# Patient Record
Sex: Male | Born: 1940
Health system: Southern US, Community
[De-identification: ages and names within clinical notes are randomized; demographics above are authoritative.]

## PROBLEM LIST (undated history)

## (undated) DIAGNOSIS — F1010 Alcohol abuse, uncomplicated: Secondary | ICD-10-CM

## (undated) DIAGNOSIS — K219 Gastro-esophageal reflux disease without esophagitis: Secondary | ICD-10-CM

## (undated) HISTORY — PX: NO PAST SURGERIES: SHX2092

---

## 2003-08-13 ENCOUNTER — Emergency Department (HOSPITAL_COMMUNITY): Admission: AD | Admit: 2003-08-13 | Discharge: 2003-08-13 | Payer: Self-pay | Admitting: *Deleted

## 2005-09-19 ENCOUNTER — Emergency Department (HOSPITAL_COMMUNITY): Admission: EM | Admit: 2005-09-19 | Discharge: 2005-09-20 | Payer: Self-pay | Admitting: Emergency Medicine

## 2013-09-22 ENCOUNTER — Encounter (HOSPITAL_COMMUNITY): Payer: Self-pay | Admitting: Emergency Medicine

## 2013-09-22 ENCOUNTER — Observation Stay (HOSPITAL_COMMUNITY)
Admission: EM | Admit: 2013-09-22 | Discharge: 2013-09-23 | Disposition: A | Discharge status: Home / Self Care | Payer: Medicare Other | Attending: Internal Medicine | Admitting: Internal Medicine

## 2013-09-22 ENCOUNTER — Emergency Department (HOSPITAL_COMMUNITY): Payer: Medicare Other

## 2013-09-22 DIAGNOSIS — K219 Gastro-esophageal reflux disease without esophagitis: Secondary | ICD-10-CM | POA: Diagnosis present

## 2013-09-22 DIAGNOSIS — E162 Hypoglycemia, unspecified: Secondary | ICD-10-CM | POA: Diagnosis present

## 2013-09-22 DIAGNOSIS — Z7982 Long term (current) use of aspirin: Secondary | ICD-10-CM | POA: Insufficient documentation

## 2013-09-22 DIAGNOSIS — R809 Proteinuria, unspecified: Secondary | ICD-10-CM | POA: Insufficient documentation

## 2013-09-22 DIAGNOSIS — F1020 Alcohol dependence, uncomplicated: Secondary | ICD-10-CM | POA: Insufficient documentation

## 2013-09-22 DIAGNOSIS — R9431 Abnormal electrocardiogram [ECG] [EKG]: Secondary | ICD-10-CM | POA: Insufficient documentation

## 2013-09-22 DIAGNOSIS — E1310 Other specified diabetes mellitus with ketoacidosis without coma: Principal | ICD-10-CM | POA: Insufficient documentation

## 2013-09-22 DIAGNOSIS — M6282 Rhabdomyolysis: Secondary | ICD-10-CM | POA: Insufficient documentation

## 2013-09-22 DIAGNOSIS — IMO0002 Reserved for concepts with insufficient information to code with codable children: Secondary | ICD-10-CM | POA: Insufficient documentation

## 2013-09-22 DIAGNOSIS — E861 Hypovolemia: Secondary | ICD-10-CM | POA: Insufficient documentation

## 2013-09-22 DIAGNOSIS — R319 Hematuria, unspecified: Secondary | ICD-10-CM

## 2013-09-22 DIAGNOSIS — R1310 Dysphagia, unspecified: Secondary | ICD-10-CM | POA: Insufficient documentation

## 2013-09-22 DIAGNOSIS — R000 Tachycardia, unspecified: Secondary | ICD-10-CM

## 2013-09-22 DIAGNOSIS — F17200 Nicotine dependence, unspecified, uncomplicated: Secondary | ICD-10-CM | POA: Insufficient documentation

## 2013-09-22 DIAGNOSIS — E1165 Type 2 diabetes mellitus with hyperglycemia: Secondary | ICD-10-CM | POA: Insufficient documentation

## 2013-09-22 DIAGNOSIS — R531 Weakness: Secondary | ICD-10-CM | POA: Diagnosis present

## 2013-09-22 DIAGNOSIS — Z23 Encounter for immunization: Secondary | ICD-10-CM | POA: Insufficient documentation

## 2013-09-22 DIAGNOSIS — F1010 Alcohol abuse, uncomplicated: Secondary | ICD-10-CM | POA: Diagnosis present

## 2013-09-22 DIAGNOSIS — D72819 Decreased white blood cell count, unspecified: Secondary | ICD-10-CM | POA: Insufficient documentation

## 2013-09-22 DIAGNOSIS — R823 Hemoglobinuria: Secondary | ICD-10-CM | POA: Insufficient documentation

## 2013-09-22 DIAGNOSIS — R634 Abnormal weight loss: Secondary | ICD-10-CM | POA: Insufficient documentation

## 2013-09-22 DIAGNOSIS — R6510 Systemic inflammatory response syndrome (SIRS) of non-infectious origin without acute organ dysfunction: Secondary | ICD-10-CM | POA: Diagnosis present

## 2013-09-22 DIAGNOSIS — D72829 Elevated white blood cell count, unspecified: Secondary | ICD-10-CM | POA: Insufficient documentation

## 2013-09-22 DIAGNOSIS — D539 Nutritional anemia, unspecified: Secondary | ICD-10-CM | POA: Insufficient documentation

## 2013-09-22 HISTORY — DX: Alcohol abuse, uncomplicated: F10.10

## 2013-09-22 HISTORY — DX: Gastro-esophageal reflux disease without esophagitis: K21.9

## 2013-09-22 LAB — CBC WITH DIFFERENTIAL/PLATELET
Basophils Absolute: 0 10*3/uL (ref 0.0–0.1)
Basophils Absolute: 0 10*3/uL (ref 0.0–0.1)
Basophils Relative: 0 % (ref 0–1)
Basophils Relative: 0 % (ref 0–1)
Eosinophils Absolute: 0 10*3/uL (ref 0.0–0.7)
Eosinophils Absolute: 0 10*3/uL (ref 0.0–0.7)
Eosinophils Relative: 0 % (ref 0–5)
Eosinophils Relative: 0 % (ref 0–5)
HCT: 37.9 % — ABNORMAL LOW (ref 39.0–52.0)
HCT: 32.3 % — ABNORMAL LOW (ref 39.0–52.0)
Hemoglobin: 13.1 g/dL (ref 13.0–17.0)
Hemoglobin: 11.1 g/dL — ABNORMAL LOW (ref 13.0–17.0)
Lymphocytes Relative: 5 % — ABNORMAL LOW (ref 12–46)
Lymphocytes Relative: 4 % — ABNORMAL LOW (ref 12–46)
Lymphs Abs: 0.9 10*3/uL (ref 0.7–4.0)
Lymphs Abs: 0.7 10*3/uL (ref 0.7–4.0)
MCH: 35.3 pg — ABNORMAL HIGH (ref 26.0–34.0)
MCH: 35 pg — ABNORMAL HIGH (ref 26.0–34.0)
MCHC: 34.6 g/dL (ref 30.0–36.0)
MCHC: 34.4 g/dL (ref 30.0–36.0)
MCV: 102.2 fL — ABNORMAL HIGH (ref 78.0–100.0)
MCV: 101.9 fL — ABNORMAL HIGH (ref 78.0–100.0)
Monocytes Absolute: 1 10*3/uL (ref 0.1–1.0)
Monocytes Absolute: 1.4 10*3/uL — ABNORMAL HIGH (ref 0.1–1.0)
Monocytes Relative: 5 % (ref 3–12)
Monocytes Relative: 8 % (ref 3–12)
Neutro Abs: 18 10*3/uL — ABNORMAL HIGH (ref 1.7–7.7)
Neutro Abs: 15.2 10*3/uL — ABNORMAL HIGH (ref 1.7–7.7)
Neutrophils Relative %: 90 % — ABNORMAL HIGH (ref 43–77)
Neutrophils Relative %: 88 % — ABNORMAL HIGH (ref 43–77)
Platelets: 264 10*3/uL (ref 150–400)
Platelets: 224 10*3/uL (ref 150–400)
RBC: 3.71 MIL/uL — ABNORMAL LOW (ref 4.22–5.81)
RBC: 3.17 MIL/uL — ABNORMAL LOW (ref 4.22–5.81)
RDW: 14.9 % (ref 11.5–15.5)
RDW: 14.7 % (ref 11.5–15.5)
WBC: 20 10*3/uL — ABNORMAL HIGH (ref 4.0–10.5)
WBC: 17.3 10*3/uL — ABNORMAL HIGH (ref 4.0–10.5)

## 2013-09-22 LAB — COMPREHENSIVE METABOLIC PANEL
ALT: 12 U/L (ref 0–53)
AST: 36 U/L (ref 0–37)
Albumin: 3.8 g/dL (ref 3.5–5.2)
Alkaline Phosphatase: 76 U/L (ref 39–117)
BUN: 26 mg/dL — ABNORMAL HIGH (ref 6–23)
CO2: 22 mEq/L (ref 19–32)
Calcium: 9 mg/dL (ref 8.4–10.5)
Chloride: 103 mEq/L (ref 96–112)
Creatinine, Ser: 1.14 mg/dL (ref 0.50–1.35)
GFR calc Af Amer: 72 mL/min — ABNORMAL LOW (ref >90)
GFR calc non Af Amer: 62 mL/min — ABNORMAL LOW (ref >90)
Glucose, Bld: 124 mg/dL — ABNORMAL HIGH (ref 70–99)
Potassium: 4.7 mEq/L (ref 3.5–5.1)
Sodium: 144 mEq/L (ref 135–145)
Total Bilirubin: 0.3 mg/dL (ref 0.3–1.2)
Total Protein: 7.4 g/dL (ref 6.0–8.3)

## 2013-09-22 LAB — URINALYSIS, ROUTINE W REFLEX MICROSCOPIC
Bilirubin Urine: NEGATIVE
Glucose, UA: 250 mg/dL — AB
Ketones, ur: 15 mg/dL — AB
Leukocytes, UA: NEGATIVE
Nitrite: NEGATIVE
Protein, ur: 30 mg/dL — AB
Specific Gravity, Urine: 1.015 (ref 1.005–1.030)
Urobilinogen, UA: 0.2 mg/dL (ref 0.0–1.0)
pH: 5 (ref 5.0–8.0)

## 2013-09-22 LAB — RAPID URINE DRUG SCREEN, HOSP PERFORMED
Amphetamines: NOT DETECTED
Barbiturates: NOT DETECTED
Benzodiazepines: NOT DETECTED
Cocaine: NOT DETECTED
Opiates: NOT DETECTED

## 2013-09-22 LAB — URINE MICROSCOPIC-ADD ON

## 2013-09-22 LAB — POCT I-STAT TROPONIN I: Troponin i, poc: 0.01 ng/mL (ref 0.00–0.08)

## 2013-09-22 LAB — TROPONIN I: Troponin I: <0.3 ng/mL (ref <0.30)

## 2013-09-22 LAB — LACTIC ACID, PLASMA: Lactic Acid, Venous: 0.8 mmol/L (ref 0.5–2.2)

## 2013-09-22 LAB — OCCULT BLOOD, POC DEVICE: Fecal Occult Bld: NEGATIVE

## 2013-09-22 LAB — GLUCOSE, CAPILLARY
Glucose-Capillary: 170 mg/dL — ABNORMAL HIGH (ref 70–99)
Glucose-Capillary: 60 mg/dL — ABNORMAL LOW (ref 70–99)
Glucose-Capillary: 89 mg/dL (ref 70–99)
Glucose-Capillary: 77 mg/dL (ref 70–99)

## 2013-09-22 LAB — ETHANOL: Alcohol, Ethyl (B): <11 mg/dL (ref 0–11)

## 2013-09-22 LAB — RETICULOCYTES: Retic Ct Pct: 0.8 % (ref 0.4–3.1)

## 2013-09-22 MED ORDER — THIAMINE HCL 100 MG/ML IJ SOLN
Freq: Once | INTRAVENOUS | Status: DC
  Filled 2013-09-22: qty 1000

## 2013-09-22 MED ORDER — VITAMIN B-1 100 MG PO TABS
100.0000 mg | ORAL_TABLET | Freq: Every day | ORAL | Status: DC
  Administered 2013-09-23: 100 mg via ORAL
  Filled 2013-09-22: qty 1

## 2013-09-22 MED ORDER — THIAMINE HCL 100 MG/ML IJ SOLN
Freq: Once | INTRAVENOUS | Status: AC
  Administered 2013-09-22: 22:00:00 via INTRAVENOUS
  Filled 2013-09-22: qty 1000

## 2013-09-22 MED ORDER — PANTOPRAZOLE SODIUM 40 MG IV SOLR
40.0000 mg | Freq: Two times a day (BID) | INTRAVENOUS | Status: DC
  Administered 2013-09-22 – 2013-09-23 (×2): 40 mg via INTRAVENOUS
  Filled 2013-09-22 (×3): qty 40

## 2013-09-22 MED ORDER — SODIUM CHLORIDE 0.9 % IV BOLUS (SEPSIS)
1000.0000 mL | Freq: Once | INTRAVENOUS | Status: AC
  Administered 2013-09-22: 1000 mL via INTRAVENOUS

## 2013-09-22 MED ORDER — ONDANSETRON HCL 4 MG PO TABS: 4.0000 mg | ORAL_TABLET | Freq: Four times a day (QID) | ORAL | Status: DC | PRN

## 2013-09-22 MED ORDER — FOLIC ACID 1 MG PO TABS
1.0000 mg | ORAL_TABLET | Freq: Every day | ORAL | Status: DC
  Administered 2013-09-23: 11:00:00 1 mg via ORAL
  Filled 2013-09-22: qty 1

## 2013-09-22 MED ORDER — LORAZEPAM 1 MG PO TABS: 1.0000 mg | ORAL_TABLET | Freq: Four times a day (QID) | ORAL | Status: DC | PRN

## 2013-09-22 MED ORDER — LORAZEPAM 2 MG/ML IJ SOLN: 1.0000 mg | Freq: Four times a day (QID) | INTRAMUSCULAR | Status: DC | PRN

## 2013-09-22 MED ORDER — THIAMINE HCL 100 MG/ML IJ SOLN: Freq: Once | INTRAVENOUS | Status: DC

## 2013-09-22 MED ORDER — SODIUM CHLORIDE 0.9 % IV SOLN: INTRAVENOUS | Status: DC

## 2013-09-22 MED ORDER — PANTOPRAZOLE SODIUM 40 MG PO TBEC: 40.0000 mg | DELAYED_RELEASE_TABLET | Freq: Every day | ORAL | Status: DC

## 2013-09-22 MED ORDER — SODIUM CHLORIDE 0.9 % IJ SOLN: 3.0000 mL | Freq: Two times a day (BID) | INTRAMUSCULAR | Status: DC

## 2013-09-22 MED ORDER — ONDANSETRON HCL 4 MG/2ML IJ SOLN: 4.0000 mg | Freq: Four times a day (QID) | INTRAMUSCULAR | Status: DC | PRN

## 2013-09-22 MED ORDER — LORAZEPAM 2 MG/ML IJ SOLN: 0.0000 mg | Freq: Four times a day (QID) | INTRAMUSCULAR | Status: DC

## 2013-09-22 MED ORDER — ONDANSETRON HCL 4 MG/2ML IJ SOLN: 4.0000 mg | Freq: Three times a day (TID) | INTRAMUSCULAR | Status: DC | PRN

## 2013-09-22 MED ORDER — LORAZEPAM 2 MG/ML IJ SOLN: 0.0000 mg | Freq: Two times a day (BID) | INTRAMUSCULAR | Status: DC

## 2013-09-22 MED ORDER — HEPARIN SODIUM (PORCINE) 5000 UNIT/ML IJ SOLN
5000.0000 [IU] | Freq: Three times a day (TID) | INTRAMUSCULAR | Status: DC
  Administered 2013-09-22 – 2013-09-23 (×3): 5000 [IU] via SUBCUTANEOUS
  Filled 2013-09-22 (×5): qty 1

## 2013-09-22 MED ORDER — INFLUENZA VAC SPLIT QUAD 0.5 ML IM SUSP
0.5000 mL | INTRAMUSCULAR | Status: AC
  Administered 2013-09-23: 11:00:00 0.5 mL via INTRAMUSCULAR
  Filled 2013-09-22: qty 0.5

## 2013-09-22 MED ORDER — ONDANSETRON HCL 4 MG/2ML IJ SOLN
4.0000 mg | Freq: Once | INTRAMUSCULAR | Status: AC
  Administered 2013-09-22: 4 mg via INTRAVENOUS
  Filled 2013-09-22: qty 2

## 2013-09-22 MED ORDER — PNEUMOCOCCAL VAC POLYVALENT 25 MCG/0.5ML IJ INJ
0.5000 mL | INJECTION | INTRAMUSCULAR | Status: AC
  Administered 2013-09-23: 11:00:00 0.5 mL via INTRAMUSCULAR
  Filled 2013-09-22: qty 0.5

## 2013-09-22 NOTE — Progress Notes (Signed)
Received pt assignment from charge at 1755. Called ED for report at 1808. Monica , RN said she needed to call back. Left my direct number.

## 2013-09-22 NOTE — H&P (Signed)
Date: 09/23/2013               Patient Name:  Brandon Taylor MRN: [id number]  DOB: 12/19/1940 Age / Sex: 72 y.o., male   PCP: Karene Fry Day, MD         Medical Service: Internal Medicine Teaching Service         Attending Physician: Dr. Rocco Serene, MD    First Contact: Jule Ser, MS3 Pager: [phone]  Second Contact: Dr. Leonia Reeves Pager: [phone]       After Hours (After 5p/  First Contact Pager: [phone]  weekends / holidays): Second Contact Pager: [phone]   Chief Complaint: Weakness  History of Present Illness:  Brandon Taylor is a 72 y.o.man PMH alcoholism, tobacco abuse, abdominal trauma leading to multiple GI surgeries in the 1970's, who was transported by EMS to the ED after collapsing to the ground, feeling weak and speaking incoherently this morning.   He reports that this morning, he arose from the couch and walked back towards his bedroom. After he had walked several yards, he said that his legs "gave out" and he "went down." He then crawled back to the bedroom. His wife was home but did not witness the fall. She stated that when she tried to speak to him shortly thereafter, his language was garbled and she was unable to comprehend him. At that point, she called 911 and EMS transported him to the hospital. He states that this has never happened before. He endorses improvement in his symptoms since arrival to the ED after eating crackers and peanut butter. He says he feels "good now."  Brandon Taylor endorses significant consumption of alcohol. Per the patient's wife he has a history of alcohol withdrawal leading to DTs in the setting of his trauma surgeries in the 1970's, but reports no seizures. He states that he drank "a fifth" of liquor last night and drinks most nights per week, but could not precisely quantify his drinking, though his wife states that he drinks most nights and has drank for 30+ years. His last drink was last night. He also smokes 4 packs of cigarettes per day.  Denies drug use. Over the last two days, he has eaten only breakfast yesterday and has not drank any water, stating that he has not been hungry or thirsty. In fact, he reports a 100-pound weight loss over the past two years. He attributes this to being diagnosed with GERD. He takes no home meds, including no nutritional supplements or vitamins. He does not follow with a PCP because he is "compeltely healthy."  On review of systems, endorses a productive cough for 2 days and some throat soreness. Denies shortness of breath, chest pain, rhinorrhea. No fevers or chills. His wife says he has had diarrhea ever since the surgery for abdominal trauma. He has trouble "absorbing foods" and has to use the bathroom soon after he eats a meal. He denies melena or hematochezia. He denies nausea, vomiting, diarrhea. He reports occasional "choking" with swallowing which has been present for a long time.  In the ED he got a 2L NS fluid bolus.  Meds: Current Facility-Administered Medications  Medication Dose Route Frequency Provider Last Rate Last Dose  . antiseptic oral rinse (BIOTENE) solution 15 mL  15 mL Mouth Rinse q12n4p Rocco Serene, MD      . chlorhexidine (PERIDEX) 0.12 % solution 15 mL  15 mL Mouth Rinse BID Rocco Serene, MD      .  folic acid (FOLVITE) tablet 1 mg  1 mg Oral Daily Ky Barban, MD      . heparin injection 5,000 Units  5,000 Units Subcutaneous Q8H Ky Barban, MD   5,000 Units at 09/23/13 0531  . influenza vac split quadrivalent PF (FLUARIX) injection 0.5 mL  0.5 mL Intramuscular Tomorrow-1000 Rocco Serene, MD      . LORazepam (ATIVAN) injection 0-4 mg  0-4 mg Intravenous Q6H Ky Barban, MD       Followed by  . [START ON 09/24/2013] LORazepam (ATIVAN) injection 0-4 mg  0-4 mg Intravenous Q12H Ky Barban, MD      . LORazepam (ATIVAN) tablet 1 mg  1 mg Oral Q6H PRN Ky Barban, MD       Or  . LORazepam (ATIVAN) injection 1 mg  1 mg  Intravenous Q6H PRN Ky Barban, MD      . ondansetron [facility]) tablet 4 mg  4 mg Oral Q6H PRN Ky Barban, MD       Or  . ondansetron (ZOFRAN) injection 4 mg  4 mg Intravenous Q6H PRN Ky Barban, MD      . pantoprazole (PROTONIX) injection 40 mg  40 mg Intravenous Q12H Ky Barban, MD   40 mg at 09/22/13 2142  . pneumococcal 23 valent vaccine (PNU-IMMUNE) injection 0.5 mL  0.5 mL Intramuscular Tomorrow-1000 Rocco Serene, MD      . sodium chloride 0.9 % injection 3 mL  3 mL Intravenous Q12H Ky Barban, MD      . thiamine (VITAMIN B-1) tablet 100 mg  100 mg Oral Daily Ky Barban, MD        Allergies: Allergies as of 09/22/2013  . (No Known Allergies)   Past Medical History  Diagnosis Date  . GERD (gastroesophageal reflux disease)   . ETOH abuse    Past Surgical History  Procedure Laterality Date  . No past surgeries     No family history on file. History   Social History  . Marital Status: Married    Spouse Name: N/A    Number of Children: N/A  . Years of Education: N/A   Occupational History  . Not on file.   Social History Main Topics  . Smoking status: Current Every Day Smoker -- 2.00 packs/day for 40 years  . Smokeless tobacco: Not on file  . Alcohol Use: Yes     Comment: fifth daily  . Drug Use: No  . Sexual Activity: Not on file   Other Topics Concern  . Not on file   Social History Narrative  . No narrative on file    Review of Systems: Pertinent items are noted in HPI.  Physical Exam: Blood pressure 124/60, pulse 80, temperature 98.8 F (37.1 C), temperature source Oral, resp. rate 20, height 6' (1.829 m), weight 123 lb 3.8 oz (55.9 kg), SpO2 95.00%. Physical Exam  Constitutional: He is oriented to person, place, and time. He appears malnourished. No distress.  Thin extremities.  HENT:  Head: Normocephalic and atraumatic.  Mouth/Throat: Mucous membranes are dry. No oropharyngeal exudate,  posterior oropharyngeal edema or posterior oropharyngeal erythema.  Eyes: Conjunctivae and EOM are normal. Pupils are equal, round, and reactive to light.  Neck: Normal range of motion. Neck supple.  Cardiovascular: Regular rhythm, normal heart sounds and intact distal pulses.  Tachycardia present.  Exam reveals no gallop and no friction rub.   No murmur heard. Pulmonary/Chest: Effort normal  and breath sounds normal. No respiratory distress. He has no wheezes. He has no rales. He exhibits no tenderness.  Abdominal: Soft. Bowel sounds are normal. He exhibits no distension. There is no tenderness.  Scars scattered over abdomen from multiple surgeries.  Musculoskeletal: Normal range of motion. He exhibits no edema and no tenderness.  Neurological: He is alert and oriented to person, place, and time. He has normal reflexes. He displays facial symmetry and normal speech. No cranial nerve deficit or sensory deficit. He exhibits normal muscle tone. Coordination normal. GCS score is 15.  Skin: Skin is warm and dry. He is not diaphoretic.  Psychiatric:  Irritable.     Lab results: Basic Metabolic Panel:  Recent Labs  45/40/98 1134 09/23/13 0215  NA 144 143  K 4.7 4.2  CL 103 112  CO2 22 25  GLUCOSE 124* 107*  BUN 26* 21  CREATININE 1.14 1.30  CALCIUM 9.0 7.7*   Liver Function Tests:  Recent Labs  09/22/13 1134  AST 36  ALT 12  ALKPHOS 76  BILITOT 0.3  PROT 7.4  ALBUMIN 3.8   No results found for this basename: LIPASE, AMYLASE,  in the last 72 hours No results found for this basename: AMMONIA,  in the last 72 hours CBC:  Recent Labs  09/22/13 1134 09/22/13 1420 09/23/13 0215  WBC 20.0* 17.3* 12.6*  NEUTROABS 18.0* 15.2*  --   HGB 13.1 11.1* 9.9*  HCT 37.9* 32.3* 28.4*  MCV 102.2* 101.9* 100.4*  PLT 264 224 208   Cardiac Enzymes:  Recent Labs  09/22/13 2110 09/23/13 0215  CKTOTAL 820*  --   TROPONINI <0.30 <0.30   BNP: No results found for this basename:  PROBNP,  in the last 72 hours D-Dimer: No results found for this basename: DDIMER,  in the last 72 hours CBG:  Recent Labs  09/22/13 1054 09/22/13 1450 09/22/13 1555 09/22/13 2052 09/23/13 0031 09/23/13 0523  GLUCAP 170* 60* 89 77 107* 94   Hemoglobin A1C:  Recent Labs  09/22/13 2110  HGBA1C 5.6   Fasting Lipid Panel: No results found for this basename: CHOL, HDL, LDLCALC, TRIG, CHOLHDL, LDLDIRECT,  in the last 72 hours Thyroid Function Tests:  Recent Labs  09/22/13 2110  TSH 0.191*   Anemia Panel:  Recent Labs  09/22/13 2110  VITAMINB12 131*  FOLATE 14.6  FERRITIN 357*  TIBC 237  IRON 28*  RETICCTPCT 0.8   Coagulation: No results found for this basename: LABPROT, INR,  in the last 72 hours Urine Drug Screen: Drugs of Abuse     Component Value Date/Time   LABOPIA NONE DETECTED 09/22/2013 1253   COCAINSCRNUR NONE DETECTED 09/22/2013 1253   LABBENZ NONE DETECTED 09/22/2013 1253   AMPHETMU NONE DETECTED 09/22/2013 1253   THCU NONE DETECTED 09/22/2013 1253   LABBARB NONE DETECTED 09/22/2013 1253    Alcohol Level:  Recent Labs  09/22/13 1134  ETH <11   Urinalysis:  Recent Labs  09/22/13 1253  COLORURINE YELLOW  LABSPEC 1.015  PHURINE 5.0  GLUCOSEU 250*  HGBUR LARGE*  BILIRUBINUR NEGATIVE  KETONESUR 15*  PROTEINUR 30*  UROBILINOGEN 0.2  NITRITE NEGATIVE  LEUKOCYTESUR NEGATIVE     Imaging results:  Ct Abdomen Pelvis Wo Contrast  09/22/2013   CLINICAL DATA:  Abdominal pain.  EXAM: CT ABDOMEN AND PELVIS WITHOUT CONTRAST  TECHNIQUE: Multidetector CT imaging of the abdomen and pelvis was performed following the standard protocol without intravenous contrast.  COMPARISON:  None.  FINDINGS: The lung bases  are clear.  No renal, ureteral, or bladder calculi. No obstructive uropathy. No perinephric stranding is seen. The kidneys are symmetric in size without evidence for exophytic mass. The bladder is unremarkable.  The liver demonstrates no focal  abnormality. The gallbladder is unremarkable. 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 The adrenal glands and pancreas are normal.  The unopacified stomach, duodenum, small intestine and large intestine are unremarkable, but evaluation is limited by lack of oral contrast. There is no pneumoperitoneum, pneumatosis, or portal venous gas. There is no abdominal or pelvic free fluid. There is no lymphadenopathy.  The abdominal aorta is normal in caliber.  There is bilateral sacroiliitis.  IMPRESSION: 1.  No acute abdominal or pelvic pathology.  2. Bilateral sacroiliitis as can be seen with inflammatory bowel disease, ankylosing spondylitis versus osteoarthritis.   Electronically Signed   By: Elige Ko   On: 09/22/2013 16:01   Dg Chest 2 View  09/22/2013   CLINICAL DATA:  Altered mental status  EXAM: CHEST  2 VIEW  COMPARISON:  None.  FINDINGS: The heart size and mediastinal contours are within normal limits. There is no focal infiltrate, pulmonary edema, or pleural effusion. There is mild scoliosis of spine.  IMPRESSION: No active cardiopulmonary disease.   Electronically Signed   By: Sherian Rein M.D.   On: 09/22/2013 12:41   Ct Head Wo Contrast  09/22/2013   CLINICAL DATA:  Nausea since 4 o'clock  EXAM: CT HEAD WITHOUT CONTRAST  TECHNIQUE: Contiguous axial images were obtained from the base of the skull through the vertex without intravenous contrast.  COMPARISON:  None.  FINDINGS: There is no evidence of mass effect, midline shift, or extra-axial fluid collections. There is no evidence of a space-occupying lesion or intracranial hemorrhage. There is no evidence of a cortical-based area of acute infarction. There is generalized cerebral atrophy. There is periventricular white matter low attenuation likely secondary to microangiopathy.  The ventricles and sulci are appropriate for the patient's age. The basal cisterns are patent.  Visualized portions of the orbits are unremarkable. The visualized portions  of the paranasal sinuses and mastoid air cells are unremarkable. Cerebrovascular atherosclerotic calcifications are noted.  The osseous structures are unremarkable.  IMPRESSION: No acute intracranial pathology.   Electronically Signed   By: Elige Ko   On: 09/22/2013 12:15    Other results: EKG: there are no previous tracings available for comparison, normal sinus rhythm, frequent PVC's noted, left axis deviation.  Assessment & Plan by Problem: Brandon Taylor is a 72 y.o.man PMH alcoholism, tobacco abuse, abdominal trauma leading to multiple GI surgeries in the 1970's, who was transported by EMS to the ED after collapsing to the ground, feeling weak and speaking incoherently this morning.   #Transient weakness - Symptoms have resolved. Weakness is likely multifactorial from dehydration, alcohol consumption, hypoglycemia. Brandon Taylor has not had sufficient food or fluid intake over the past two days and this appears to be a chronic issue for him (see problem below). Symptoms could represent a TIA/stroke, but his neuro exam is not focal and he had an unremarkable head CT. Vitamin B12 deficiency can present with neurologic sequelae, and he does have a macrocytic anemia (Hgb 11.1, MCV 101.9). Patient is at risk for this given his bowel resection surgeries (esp. If his terminal ileum was affected) and alcoholism. EKG shows multiple PVCs. ACS and arrhythmia are on the differential. POC troponin is negative. - Admit to IMTS, telemetry - Banana bag @100cc /hr - Vitamin B12 > 131 (  low) - Smear review - Folate > 14.6 (wnl) - Cycle troponins > neg x2 - BMP and CBC in am - Repeat EKG in am - PT eval and treat - NPO pending SLP  #SIRS - Patient met SIRS criteria on admission with a leukocytosis of 20.0 with left shift to 90%, tachycardia, hypothermia to 96. He endorses a productive cough. CXR with no no acute abnormalities. Not complaining of dysuria. UA not suggestive of UTI. Satting 96% on room air. -  Blood cultures x2 - Vital signs q6h - Lactic acid > 0.8 (wnl) - Repeat CXR in am to evaluate for pneumonia after hydration  #Excessive alcohol intake - Brandon Taylor likely has alcohol dependence, based on his longstanding alcohol intake and history of DTs with a prior admission. His alcohol level is negative, suggesting it has been at least 8-12 hours since his last drink. He says he drank a 5th of liquor on the morning before admission. On exam he is showing early signs of alcohol withdrawal, including tachycardia and irritability. - CIWA protocol with Ativan - Folate 1mg  daily - Thiamine 100mg  daily - Seizure precautions  #Hemoglobinuria - UA with large hemoglobin but few RBCs. - Obtain CK and CKMB > CK 820 (elevated)  #Metabolic acidosis - Anion gap is 19 with delta gap of 3.5 suggesting combined metabolic acidosis and compensated respiratory acidosis. The latter makes sense given likely COPD (CXR with hyperinflation and 4PPD smoking history). Differential includes lactic acidosis (meets SIRS criteria, likely rhabdomyolysis given hemoglobinuria, elevated CK), starvation ketoacidosis (low PO intake, ketones in urine), alcoholic ketoacidosis (history of alcohol abuse).  - Continue to monitor, work up as above  #Chronic weight loss - Patient reports a 100lb weight loss over the past 2 years. He has not had regular follow up with the medical system. He has had multiple GI surgeries including bowel resections, which increases his risk of malabsorption syndromes. Albumin is 3.8, so he does not appear to have severe protein calorie malnutrition. CXR shows hyperinflation and patient has a 4 PPD smoking history x 40 years, so longstanding COPD could also be contributing to his weight loss. Malignancy (esp. lung cancer and esophageal cancer given dysphagia) is also possible but CXR is without obvious mass. - Nutrition consult - Ferritin > 357 (high) - Iron > 28 (low) - TIBC > 237 (wnl) - Saturation  ratios > 12% (low) - Reticulocytes > 0.8% (wnl) - TSH > 0.191 (low) - Free T4 - HIV ab > negative - Hemoglobin A1C > 5.6 (wnl) - CBG q4h  #GERD - Protonix IV  #DVT - Heparin subq   Dispo: Disposition is deferred at this time, awaiting improvement of current medical problems. Anticipated discharge in approximately 1-3 day(s).   The patient does have a current PCP Karene Fry Day, MD) and does need an [facility] hospital follow-up appointment after discharge.  The patient does not have transportation limitations that hinder transportation to clinic appointments.  Signed: Vivi Barrack, MD 09/23/2013, 7:36 AM

## 2013-09-22 NOTE — ED Notes (Signed)
Patient transported to CT 

## 2013-09-22 NOTE — ED Notes (Signed)
Upon standing beside stretcher, c/o dizziness & generalized weakness. Noted that HR increased to 120's. ED MD at bedside. Also informed of pt c/o sore throat  & difficulty swallowing food, states has had this problem intermittently in the past

## 2013-09-22 NOTE — ED Notes (Signed)
C/o nausea since 0400 no emesis. Pt admits to drinking a pint of moonshine yesterday & only meal was in the morning. Family reports that pt is heavy daily drinker x 3-4 years. Stated, "He really drank a lot last night. Went to bed by 7 pm".  Denies any abd pain, CP, no voiced complaints
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
## 2013-09-22 NOTE — ED Notes (Signed)
Notified RN of CBG 170

## 2013-09-22 NOTE — ED Notes (Signed)
Took pts CBG. CBG was 89 reported to nurse [facility]

## 2013-09-22 NOTE — ED Notes (Signed)
Asked pt to ambulate pt stated he didn't really feel like walking.

## 2013-09-22 NOTE — ED Notes (Signed)
Took pts CBG. CBG was 60 reported to nurse [facility].

## 2013-09-22 NOTE — ED Notes (Signed)
Family reports pt unresponsive, initial CBG 40, given amp D50 with increase CBG to 233 then became more alert.
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
## 2013-09-22 NOTE — ED Provider Notes (Signed)
Medical screening examination/treatment/procedure(s) were conducted as a shared visit with non-physician practitioner(s) and myself.  I personally evaluated the patient during the encounter.  Generalized weakness with collapse across bed. Did not hit head or LOC.  Hx alcohol abuse, denies other history. Cachectic, shivering, tachycardia, mild tremors. Disoriented to time (baseline per wife) Leukocytosis with hypothermia.  CXR neg, UA negative but blood.  IVF, thiamine, prn ativan.  EKG Interpretation    Date/Time:  Thursday September 22 2013 11:34:27 EST Ventricular Rate:  93 PR Interval:  144 QRS Duration: 75 QT Interval:  375 QTC Calculation: 466 R Axis:   -16 Text Interpretation:  Sinus rhythm Multiple ventricular premature complexes Borderline left axis deviation Abnormal R-wave progression, early transition Borderline T abnormalities, lateral leads No previous ECGs available Confirmed by Manus Gunning  MD, Joseph Johns ([id number]) on 09/22/2013 11:48:59 AM             Glynn Octave, MD 09/22/13 2042

## 2013-09-23 ENCOUNTER — Inpatient Hospital Stay (HOSPITAL_COMMUNITY): Payer: Medicare Other

## 2013-09-23 LAB — IRON AND TIBC
Iron: 28 ug/dL — ABNORMAL LOW (ref 42–135)
TIBC: 237 ug/dL (ref 215–435)
UIBC: 209 ug/dL (ref 125–400)

## 2013-09-23 LAB — GLUCOSE, CAPILLARY
Glucose-Capillary: 107 mg/dL — ABNORMAL HIGH (ref 70–99)
Glucose-Capillary: 144 mg/dL — ABNORMAL HIGH (ref 70–99)
Glucose-Capillary: 88 mg/dL (ref 70–99)
Glucose-Capillary: 94 mg/dL (ref 70–99)

## 2013-09-23 LAB — BASIC METABOLIC PANEL
Calcium: 7.7 mg/dL — ABNORMAL LOW (ref 8.4–10.5)
GFR calc Af Amer: 61 mL/min — ABNORMAL LOW (ref >90)
GFR calc non Af Amer: 53 mL/min — ABNORMAL LOW (ref >90)
Glucose, Bld: 107 mg/dL — ABNORMAL HIGH (ref 70–99)
Potassium: 4.2 mEq/L (ref 3.5–5.1)
Sodium: 143 mEq/L (ref 135–145)

## 2013-09-23 LAB — CBC
MCH: 35 pg — ABNORMAL HIGH (ref 26.0–34.0)
Platelets: 208 10*3/uL (ref 150–400)
RBC: 2.83 MIL/uL — ABNORMAL LOW (ref 4.22–5.81)
WBC: 12.6 10*3/uL — ABNORMAL HIGH (ref 4.0–10.5)

## 2013-09-23 LAB — HEMOGLOBIN A1C
Hgb A1c MFr Bld: 5.6 % (ref <5.7)
Mean Plasma Glucose: 114 mg/dL (ref <117)

## 2013-09-23 LAB — FERRITIN: Ferritin: 357 ng/mL — ABNORMAL HIGH (ref 22–322)

## 2013-09-23 LAB — VITAMIN B12: Vitamin B-12: 131 pg/mL — ABNORMAL LOW (ref 211–911)

## 2013-09-23 LAB — FOLATE: Folate: 14.6 ng/mL

## 2013-09-23 LAB — HIV ANTIBODY (ROUTINE TESTING W REFLEX): HIV: NONREACTIVE

## 2013-09-23 LAB — TSH: TSH: 0.191 u[IU]/mL — ABNORMAL LOW (ref 0.350–4.500)

## 2013-09-23 MED ORDER — CHLORHEXIDINE GLUCONATE 0.12 % MT SOLN
15.0000 mL | Freq: Two times a day (BID) | OROMUCOSAL | Status: DC
  Filled 2013-09-23 (×3): qty 15

## 2013-09-23 MED ORDER — THIAMINE HCL 100 MG PO TABS: 100.0000 mg | ORAL_TABLET | Freq: Every day | ORAL | Status: DC

## 2013-09-23 MED ORDER — ENSURE COMPLETE PO LIQD
237.0000 mL | Freq: Two times a day (BID) | ORAL | Status: DC
  Administered 2013-09-23: 237 mL via ORAL

## 2013-09-23 MED ORDER — ASPIRIN EC 81 MG PO TBEC: 81.0000 mg | DELAYED_RELEASE_TABLET | Freq: Every day | ORAL | Status: DC

## 2013-09-23 MED ORDER — CYANOCOBALAMIN 1000 MCG/ML IJ SOLN
1000.0000 ug | Freq: Once | INTRAMUSCULAR | Status: AC
  Administered 2013-09-23: 1000 ug via INTRAMUSCULAR
  Filled 2013-09-23: qty 1

## 2013-09-23 MED ORDER — FOLIC ACID 1 MG PO TABS: 1.0000 mg | ORAL_TABLET | Freq: Every day | ORAL | Status: DC

## 2013-09-23 MED ORDER — PANTOPRAZOLE SODIUM 40 MG PO TBEC: 40.0000 mg | DELAYED_RELEASE_TABLET | Freq: Every day | ORAL | Status: DC

## 2013-09-23 MED ORDER — BIOTENE DRY MOUTH MT LIQD: 15.0000 mL | Freq: Two times a day (BID) | OROMUCOSAL | Status: DC

## 2013-09-23 NOTE — Evaluation (Signed)
Clinical/Bedside Swallow Evaluation Patient Details  Name: Brandon Taylor MRN: [id number] Date of Birth: 12/19/1940  Today's Date: 09/23/2013 Time: 4098-1191 SLP Time Calculation (min): 10 min  Past Medical History:  Past Medical History  Diagnosis Date  . GERD (gastroesophageal reflux disease)   . ETOH abuse    Past Surgical History:  Past Surgical History  Procedure Laterality Date  . No past surgeries     HPI:  72 y.o.man PMH alcoholism, tobacco abuse, abdominal trauma leading to multiple GI surgeries in the 1970's, who was transported by EMS to the ED after collapsing to the ground, feeling weak and speaking incoherently Current dx include SIRS, excessive alcohol intake, hemoglobinuria, metabolic acidosis, chronic weight loss.  Reports hx of choking with swallowing in the past.    Assessment / Plan / Recommendation Clinical Impression  Pt presents with normal oropharyngeal swallow with active mastication, swift swallow response, and no indications of penetration nor aspiration.  Pt presents with intermittent eructation during assessment, and states that he has to eat "slow so I don't choke."  Suspect GERD primary issue.  No SLP needs identified.  Recommend resumption of a regular consistency diet, thin liquids.      Aspiration Risk    minimal   Diet Recommendation Regular;Thin liquid   Liquid Administration via: Cup;Straw Medication Administration: Whole meds with liquid Supervision: Patient able to self feed Postural Changes and/or Swallow Maneuvers: Upright 30-60 min after meal    Other  Recommendations     Follow Up Recommendations  None     Swallow Study Prior Functional Status       General HPI: 72 y.o.man PMH alcoholism, tobacco abuse, abdominal trauma leading to multiple GI surgeries in the 1970's, who was transported by EMS to the ED after collapsing to the ground, feeling weak and speaking incoherently Current dx include SIRS, excessive alcohol intake,  hemoglobinuria, metabolic acidosis, chronic weight loss.  Reports hx of choking with swallowing in the past.  Type of Study: Bedside swallow evaluation Diet Prior to this Study: NPO Temperature Spikes Noted: No Respiratory Status: Room air History of Recent Intubation: No Behavior/Cognition: Alert;Cooperative Oral Cavity - Dentition: Missing dentition;Poor condition Self-Feeding Abilities: Able to feed self Patient Positioning: Upright in bed Baseline Vocal Quality: Clear Volitional Cough: Strong Volitional Swallow: Able to elicit    Oral/Motor/Sensory Function Overall Oral Motor/Sensory Function: Appears within functional limits for tasks assessed   Ice Chips Ice chips: Within functional limits Presentation: Self Fed   Thin Liquid Thin Liquid: Within functional limits Presentation: Cup;Straw;Self Fed    Nectar Thick Nectar Thick Liquid: Not tested   Honey Thick Honey Thick Liquid: Not tested   Puree Puree: Within functional limits   Solid  Brandon Taylor L. [place], [place] CCC/SLP Pager [phone]     Solid: Within functional limits Presentation: Self Fed       Brandon Taylor Brandon Taylor 09/23/2013,10:04 AM

## 2013-09-23 NOTE — Discharge Summary (Signed)
Name: Brandon Taylor MRN: [id number] DOB: 12/19/1940 72 y.o. PCP: Trinna Post, MD  Date of Admission: 09/22/2013 10:47 AM Date of Discharge: 09/23/2013 Attending Physician: Rocco Serene, MD  Discharge Diagnosis: 1. Transient weakness 2. SIRS 3. Excessive alcohol intake 4. Mild rhabdomyolysis 5. Metabolic acidosis 6. Chronic weight loss 7. Macrocytic anemia  Discharge Medications:   Medication List         aspirin EC 81 MG tablet  Take 1 tablet (81 mg total) by mouth daily.     folic acid 1 MG tablet  Commonly known as:  FOLVITE  Take 1 tablet (1 mg total) by mouth daily.     pantoprazole 40 MG tablet  Commonly known as:  PROTONIX  Take 1 tablet (40 mg total) by mouth daily before breakfast.     thiamine 100 MG tablet  Take 1 tablet (100 mg total) by mouth daily.        Disposition and follow-up:   Brandon Taylor was discharged from [facility] in Stable condition.  At the hospital follow up visit please address:  1.  Please continue to provide B12 shots. He will need [phone] mcg every day or every other day for 1-2 weeks and maintenance doses of [phone] mcg every 1-3 months. Progress on tobacco cessation? Alcohol cessation? Consider starting calcium channel blocker for esophageal spasm.  2.  Labs / imaging needed at time of follow-up: BMP, CBC, colonoscopy  3.  Pending labs/ test needing follow-up: None  Follow-up Appointments: Follow-up Information   Follow up with [facility]     On 10/03/2013. (@9 :00am. This is your new PCP.)    Contact information:   [address] [place] [address] [phone]      Discharge Instructions: Discharge Orders   Future Appointments Provider Department Dept Phone   10/03/2013 9:00 AM [facility] Covering Provider [facility] [phone]   Future Orders Complete By Expires   Diet - low sodium heart healthy  As  directed    Increase activity slowly  As directed       Consultations:  None  Procedures Performed:  Ct Abdomen Pelvis Wo Contrast  09/22/2013   CLINICAL DATA:  Abdominal pain.  EXAM: CT ABDOMEN AND PELVIS WITHOUT CONTRAST  TECHNIQUE: Multidetector CT imaging of the abdomen and pelvis was performed following the standard protocol without intravenous contrast.  COMPARISON:  None.  FINDINGS: The lung bases are clear.  No renal, ureteral, or bladder calculi. No obstructive uropathy. No perinephric stranding is seen. The kidneys are symmetric in size without evidence for exophytic mass. The bladder is unremarkable.  The liver demonstrates no focal abnormality. The gallbladder is unremarkable. 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 The adrenal glands and pancreas are normal.  The unopacified stomach, duodenum, small intestine and large intestine are unremarkable, but evaluation is limited by lack of oral contrast. There is no pneumoperitoneum, pneumatosis, or portal venous gas. There is no abdominal or pelvic free fluid. There is no lymphadenopathy.  The abdominal aorta is normal in caliber.  There is bilateral sacroiliitis.  IMPRESSION: 1.  No acute abdominal or pelvic pathology.  2. Bilateral sacroiliitis as can be seen with inflammatory bowel disease, ankylosing spondylitis versus osteoarthritis.   Electronically Signed   By: Elige Ko   On: 09/22/2013 16:01   Dg Chest 2 View  09/23/2013   CLINICAL DATA:  Cough.  EXAM: CHEST  2 VIEW  COMPARISON:  Chest radiograph 09/22/2013  FINDINGS: Stable cardiac and mediastinal contours. Persistent chronic blunting of the bilateral costophrenic angles. Minimal scarring left lung base. No large consolidative pulmonary opacities. No pleural effusion or pneumothorax. Regional skeleton is unremarkable.  IMPRESSION: No acute cardiopulmonary process.   Electronically Signed   By: Annia Belt M.D.   On: 09/23/2013 07:51   Dg Chest 2 View  09/22/2013   CLINICAL  DATA:  Altered mental status  EXAM: CHEST  2 VIEW  COMPARISON:  None.  FINDINGS: The heart size and mediastinal contours are within normal limits. There is no focal infiltrate, pulmonary edema, or pleural effusion. There is mild scoliosis of spine.  IMPRESSION: No active cardiopulmonary disease.   Electronically Signed   By: Sherian Rein M.D.   On: 09/22/2013 12:41   Ct Head Wo Contrast  09/22/2013   CLINICAL DATA:  Nausea since 4 o'clock  EXAM: CT HEAD WITHOUT CONTRAST  TECHNIQUE: Contiguous axial images were obtained from the base of the skull through the vertex without intravenous contrast.  COMPARISON:  None.  FINDINGS: There is no evidence of mass effect, midline shift, or extra-axial fluid collections. There is no evidence of a space-occupying lesion or intracranial hemorrhage. There is no evidence of a cortical-based area of acute infarction. There is generalized cerebral atrophy. There is periventricular white matter low attenuation likely secondary to microangiopathy.  The ventricles and sulci are appropriate for the patient's age. The basal cisterns are patent.  Visualized portions of the orbits are unremarkable. The visualized portions of the paranasal sinuses and mastoid air cells are unremarkable. Cerebrovascular atherosclerotic calcifications are noted.  The osseous structures are unremarkable.  IMPRESSION: No acute intracranial pathology.   Electronically Signed   By: Elige Ko   On: 09/22/2013 12:15   Dg Esophagus  09/23/2013   CLINICAL DATA:  Dysphagia  EXAM: ESOPHOGRAM/BARIUM SWALLOW  TECHNIQUE: Fluoroscopic and image evaluation of the esophagus was performed using thick and thin liquid barium. The patient also consumed a 13 mm barium tablet under fluoroscopic guidance.  COMPARISON:  None.  FLUOROSCOPY TIME:  3 min, 8 seconds  FINDINGS: The patient swallows readily. There is esophageal dysmotility with spasm and tertiary contractions distally.  There is no hiatal hernia or reflux. There  is no demonstrable fixed stricture. There is no esophageal mass or ulceration. Pharynx appears normal.  13 mm barium tablet passed freely to the distal esophagus near the gastroesophageal junction. At this point, there was spasm; the tablet head a partially dissolved before passing into the stomach.  IMPRESSION: Distal esophageal spasm and esophageal dysmotility. No mass or ulceration. No fixed stricture. No appreciable hiatal hernia or reflux demonstrated.   Electronically Signed   By: Bretta Bang M.D.   On: 09/23/2013 14:32    Admission HPI: VONNIE LIGMAN is a 72 y.o.man PMH alcoholism, tobacco abuse, abdominal trauma leading to multiple GI surgeries in the 1970's, who was transported by EMS to the ED after collapsing to the ground, feeling weak and speaking incoherently this morning.  He reports that this morning, he arose from the couch and walked back towards his bedroom. After he had walked several yards, he said that his legs "gave out" and he "went down." He then crawled back to the bedroom. His wife was home but did not witness the fall. She stated that when she tried to speak to him shortly thereafter, his language was garbled and she was unable to comprehend him. At that point, she called 911 and EMS  transported him to the hospital. He states that this has never happened before. He endorses improvement in his symptoms since arrival to the ED after eating crackers and peanut butter. He says he feels "good now."  Mr. Robideau endorses significant consumption of alcohol. Per the patient's wife he has a history of alcohol withdrawal leading to DTs in the setting of his trauma surgeries in the 1970's, but reports no seizures. He states that he drank "a fifth" of liquor last night and drinks most nights per week, but could not precisely quantify his drinking, though his wife states that he drinks most nights and has drank for 30+ years. His last drink was last night. He also smokes 4 packs of  cigarettes per day. Denies drug use. Over the last two days, he has eaten only breakfast yesterday and has not drank any water, stating that he has not been hungry or thirsty. In fact, he reports a 100-pound weight loss over the past two years. He attributes this to being diagnosed with GERD. He takes no home meds, including no nutritional supplements or vitamins. He does not follow with a PCP because he is "compeltely healthy."  On review of systems, endorses a productive cough for 2 days and some throat soreness. Denies shortness of breath, chest pain, rhinorrhea. No fevers or chills. His wife says he has had diarrhea ever since the surgery for abdominal trauma. He has trouble "absorbing foods" and has to use the bathroom soon after he eats a meal. He denies melena or hematochezia. He denies nausea, vomiting, diarrhea. He reports occasional "choking" with swallowing which has been present for a long time.  In the ED he got a 2L NS fluid bolus.  Physical Exam:  Blood pressure 124/60, pulse 80, temperature 98.8 F (37.1 C), temperature source Oral, resp. rate 20, height 6' (1.829 m), weight 123 lb 3.8 oz (55.9 kg), SpO2 95.00%.  Physical Exam  Constitutional: He is oriented to person, place, and time. He appears malnourished. No distress.  Thin extremities.  HENT:  Head: Normocephalic and atraumatic.  Mouth/Throat: Mucous membranes are dry. No oropharyngeal exudate, posterior oropharyngeal edema or posterior oropharyngeal erythema.  Eyes: Conjunctivae and EOM are normal. Pupils are equal, round, and reactive to light.  Neck: Normal range of motion. Neck supple.  Cardiovascular: Regular rhythm, normal heart sounds and intact distal pulses. Tachycardia present. Exam reveals no gallop and no friction rub.  No murmur heard.  Pulmonary/Chest: Effort normal and breath sounds normal. No respiratory distress. He has no wheezes. He has no rales. He exhibits no tenderness.  Abdominal: Soft. Bowel sounds are  normal. He exhibits no distension. There is no tenderness.  Scars scattered over abdomen from multiple surgeries.  Musculoskeletal: Normal range of motion. He exhibits no edema and no tenderness.  Neurological: He is alert and oriented to person, place, and time. He has normal reflexes. He displays facial symmetry and normal speech. No cranial nerve deficit or sensory deficit. He exhibits normal muscle tone. Coordination normal. GCS score is 15.  Skin: Skin is warm and dry. He is not diaphoretic.  Psychiatric:  Irritable.    Hospital Course by problem list: Brandon Taylor is a 72 y.o.man PMH alcoholism, tobacco abuse, abdominal trauma leading to multiple GI surgeries in the 1970's, who was transported by EMS to the ED after collapsing to the ground, feeling weak and speaking incoherently this morning.   1. Transient weakness - Likely multifactorial from dehydration, alcohol consumption, hypoglycemia, mild rhabdomyolysis. Lab work  was significant for B12 deficiency with macrocytic anemia (Hgb 11.1, MCV 101.9) and history of bowel resection surgeries plus alcoholism. He got 2L of fluid in the ED followed by continuous IVF Banana bag @100cc /hr. Vitamin B12 was found to be 131 (low), so we gave 1000 mcg IM. Other labs include: Folate > 14.6 (wnl). Cycled troponins > neg x3. Admission EKG showed frequent PVCs, but upon repeat the next morning it was within normal limits. PT saw the patient and recommended no follow up.  2. SIRS - Patient met SIRS criteria on admission with a leukocytosis of 20.0 with left shift to 90%, tachycardia, hypothermia to 96. He was complaining of a mild cough, but CXR was normal other than hyperinflation. Repeat CXR after hydration was also nonfocal. This morning leukocytosis was downtrending as below. Tachycardia, hypothermia have resolved. Lactic acid > 0.8 (wnl). Likely inflammatory response to resolving rhabdomyolysis.  WBC   Date  Value  Range  Status   09/23/2013  12.6*   4.0 - 10.5 K/uL  Final   09/22/2013  17.3*  4.0 - 10.5 K/uL  Final   09/22/2013  20.0*  4.0 - 10.5 K/uL  Final    3. Excessive alcohol intake - Mr. Elison likely has alcohol dependence, based on his longstanding high alcohol intake and history of DTs. On admission he was showing early signs of alcohol withdrawal, including tachycardia and irritability. All vital signs are stable this morning. CIWAs have been consistently 1. He has not gotten any Ativan today. We started Folate 1mg  daily and Thiamine 100mg  daily on discharge.   4. Mild rhabdomyolysis - UA with large hemoglobin but few RBCs suggesting myoglobinuria. CK 820 (elevated). We provided fluid resuscitation as above and monitored for AKI. Cr within normal limits.  5. Metabolic acidosis - Anion gap on admission was 19 with delta gap of 3.5, suggesting combined metabolic acidosis and compensated respiratory acidosis. Likely 2/2 alcohol vs. starvation ketoacidosis. AG improved to 6 this morning.   6. Chronic weight loss - Patient reports a 100lb weight loss over the past 2 years. He has not had regular follow up with the medical system. Differential includes malabsorption syndrome vs. longstanding COPD vs. Malignancy. Because of some chronic dysphasia and weight loss a barium swallow was performed prior to discharge and demonstrated some mild distal esophageal spasm. We did not start calcium channel blockers during the admission given his recent fall, but is something that could be considered if his blood pressure remains elevated as an outpatient. SLP saw the patient and suspected GERD, recommended regular diet. Labs include Ferritin > 357 (high), Iron > 28 (low), TIBC > 237 (wnl), Saturation ratios > 12% (low), Reticulocytes > 0.8% (wnl), TSH > 0.191 (low), free T4 1.09 (wnl), HIV ab > negative, Hemoglobin A1C > 5.6 (wnl). Primary care follow up was emphasized with the patient. He has not been getting preventative medicine including colonoscopy.  [facility] up was arranged in the [facility] as his wife's family physician was not accepting new Medicare patients.  7. Macrocytic anemia - With hydration his hematocrit decreased from to 37 to 28, likely his baseline, and the admission level was almost certainly due to hemoconcentration. Continue to monitor as an outpatient.   Discharge Vitals:   BP 152/71  Pulse 73  Temp(Src) 98.2 F (36.8 C) (Oral)  Resp 20  Ht 6' (1.829 m)  Wt 123 lb 3.8 oz (55.9 kg)  BMI 16.71 kg/m2  SpO2 97%  Discharge Labs:  Results  for orders placed during the hospital encounter of 09/22/13 (from the past 24 hour(s))  GLUCOSE, CAPILLARY     Status: None   Collection Time    09/22/13  3:55 PM      Result Value Range   Glucose-Capillary 89  70 - 99 mg/dL  GLUCOSE, CAPILLARY     Status: None   Collection Time    09/22/13  8:52 PM      Result Value Range   Glucose-Capillary 77  70 - 99 mg/dL  TSH     Status: Abnormal   Collection Time    09/22/13  9:10 PM      Result Value Range   TSH 0.191 (*) 0.350 - 4.500 uIU/mL  HEMOGLOBIN A1C     Status: None   Collection Time    09/22/13  9:10 PM      Result Value Range   Hemoglobin A1C 5.6  <5.7 %   Mean Plasma Glucose 114  <117 mg/dL  VITAMIN W09     Status: Abnormal   Collection Time    09/22/13  9:10 PM      Result Value Range   Vitamin B-12 131 (*) 211 - 911 pg/mL  FOLATE     Status: None   Collection Time    09/22/13  9:10 PM      Result Value Range   Folate 14.6    IRON AND TIBC     Status: Abnormal   Collection Time    09/22/13  9:10 PM      Result Value Range   Iron 28 (*) 42 - 135 ug/dL   TIBC 811  914 - 782 ug/dL   Saturation Ratios 12 (*) 20 - 55 %   UIBC 209  125 - 400 ug/dL  FERRITIN     Status: Abnormal   Collection Time    09/22/13  9:10 PM      Result Value Range   Ferritin 357 (*) 22 - 322 ng/mL  RETICULOCYTES     Status: Abnormal   Collection Time    09/22/13  9:10 PM      Result Value Range   Retic  Ct Pct 0.8  0.4 - 3.1 %   RBC. 2.98 (*) 4.22 - 5.81 MIL/uL   Retic Count, Manual 23.8  19.0 - 186.0 K/uL  TROPONIN I     Status: None   Collection Time    09/22/13  9:10 PM      Result Value Range   Troponin I <0.30  <0.30 ng/mL  HIV ANTIBODY (ROUTINE TESTING)     Status: None   Collection Time    09/22/13  9:10 PM      Result Value Range   HIV NON REACTIVE  NON REACTIVE  CK     Status: Abnormal   Collection Time    09/22/13  9:10 PM      Result Value Range   Total CK 820 (*) 7 - 232 U/L  LACTIC ACID, PLASMA     Status: None   Collection Time    09/22/13  9:23 PM      Result Value Range   Lactic Acid, Venous 0.8  0.5 - 2.2 mmol/L  GLUCOSE, CAPILLARY     Status: Abnormal   Collection Time    09/23/13 12:31 AM      Result Value Range   Glucose-Capillary 107 (*) 70 - 99 mg/dL  BASIC METABOLIC PANEL     Status: Abnormal  Collection Time    09/23/13  2:15 AM      Result Value Range   Sodium 143  135 - 145 mEq/L   Potassium 4.2  3.5 - 5.1 mEq/L   Chloride 112  96 - 112 mEq/L   CO2 25  19 - 32 mEq/L   Glucose, Bld 107 (*) 70 - 99 mg/dL   BUN 21  6 - 23 mg/dL   Creatinine, Ser 1.61  0.50 - 1.35 mg/dL   Calcium 7.7 (*) 8.4 - 10.5 mg/dL   GFR calc non Af Amer 53 (*) >90 mL/min   GFR calc Af Amer 61 (*) >90 mL/min  CBC     Status: Abnormal   Collection Time    09/23/13  2:15 AM      Result Value Range   WBC 12.6 (*) 4.0 - 10.5 K/uL   RBC 2.83 (*) 4.22 - 5.81 MIL/uL   Hemoglobin 9.9 (*) 13.0 - 17.0 g/dL   HCT 09.6 (*) 04.5 - 40.9 %   MCV 100.4 (*) 78.0 - 100.0 fL   MCH 35.0 (*) 26.0 - 34.0 pg   MCHC 34.9  30.0 - 36.0 g/dL   RDW 81.1  91.4 - 78.2 %   Platelets 208  150 - 400 K/uL  TROPONIN I     Status: None   Collection Time    09/23/13  2:15 AM      Result Value Range   Troponin I <0.30  <0.30 ng/mL  GLUCOSE, CAPILLARY     Status: None   Collection Time    09/23/13  5:23 AM      Result Value Range   Glucose-Capillary 94  70 - 99 mg/dL  GLUCOSE, CAPILLARY      Status: None   Collection Time    09/23/13  7:55 AM      Result Value Range   Glucose-Capillary 88  70 - 99 mg/dL  TROPONIN I     Status: None   Collection Time    09/23/13  8:01 AM      Result Value Range   Troponin I <0.30  <0.30 ng/mL  T4, FREE     Status: None   Collection Time    09/23/13  8:30 AM      Result Value Range   Free T4 1.09  0.80 - 1.80 ng/dL  GLUCOSE, CAPILLARY     Status: Abnormal   Collection Time    09/23/13 11:51 AM      Result Value Range   Glucose-Capillary 144 (*) 70 - 99 mg/dL    Signed: Vivi Barrack, MD 09/23/2013, 3:44 PM   Time Spent on Discharge: 30 minutes Services Ordered on Discharge: None Equipment Ordered on Discharge: None

## 2013-09-23 NOTE — Progress Notes (Signed)
Patient admitted to 5w33 from ED. Patient lives at home with wife. Patient is A&Ox3. Patient's skin is warm, dry and intact. Patient placed on bedalarm, and instructed to call for assistance before getting out of bed. Patient stated understanding. Placed patient on telemetry. Oriented patient and family to room and unit. Will continue to monitor patient. Nelda Marseille, RN

## 2013-09-23 NOTE — Progress Notes (Signed)
[name] to be D/C'd Home per MD order.  Discussed with the patient and all questions fully answered.    Medication List         aspirin EC 81 MG tablet  Take 1 tablet (81 mg total) by mouth daily.     folic acid 1 MG tablet  Commonly known as:  FOLVITE  Take 1 tablet (1 mg total) by mouth daily.     pantoprazole 40 MG tablet  Commonly known as:  PROTONIX  Take 1 tablet (40 mg total) by mouth daily before breakfast.     thiamine 100 MG tablet  Take 1 tablet (100 mg total) by mouth daily.        VVS, Skin clean, dry and intact without evidence of skin break down, no evidence of skin tears noted. IV catheter discontinued intact. Site without signs and symptoms of complications. Dressing and pressure applied.  An After Visit Summary was printed and given to the patient. Follow up appointments , new prescriptions and medication administration times given. Handouts on thiamine, B12, folic acid and protonix given to pt and family member. All questions answered Patient escorted via WC, and D/C home via private auto.  Cindra Eves, RN 09/23/2013 4:22 PM

## 2013-09-23 NOTE — H&P (Signed)
Internal Medicine Attending Admission Note Date: 09/23/2013  Patient name: Brandon Taylor Medical record number: [id number] Date of birth: 12/19/1940 Age: 72 y.o. Gender: male  I saw and evaluated the patient. I reviewed the resident's note and I agree with the resident's findings and plan as documented in the resident's note.  [name] is a 71 year old man with a history of alcohol abuse, tobacco abuse, and abdominal trauma in the 1970s resulting in numerous GI surgeries who presents after falling. On the morning of admission he was in his usual state of health. He arose from the couch to go to the bathroom, walked for several feet and his legs gave out resulting in a fall. He returned to the bedroom where his wife saw him and noted that his speech was garbled and incomprehensible. She called 911 and she was concerned. By the time he presented to the emergency department he felt fine. Nonetheless, he was admitted to the internal medicine teaching service for observation and further evaluation.  Orthostatic blood pressures were not obtained on admission yet he was agressively hydrated because of a concern for volume depletion. He was also noted to have an alcoholic ketoacidosis. With hydration his hematocrit decreased to 28, likely his baseline, and the admission level was almost certainly due to hemoconcentration. The cause of his anemia was found to be a B12 deficiency and probable bone marrow suppression from alcohol. He was given B12 intramuscularly prior to discharge. After hydration he felt much improved and this morning on rounds he was without complaints and ready for discharge. Because of some chronic dysphasia and weight loss a barium swallow was performed prior to discharge and demonstrated some mild distal esophageal spasm. We did not start calcium channel blockers during the admission given his recent fall but is something that could be considered if his blood pressure remains elevated as an  outpatient.

## 2013-09-23 NOTE — Care Management Note (Signed)
    Page 1 of 1   09/23/2013     2:31:27 PM   CARE MANAGEMENT NOTE 09/23/2013  Patient:  Brandon Taylor, Brandon Taylor   Account Number:  [id number]  Date Initiated:  09/23/2013  Documentation initiated by:  Letha Cape  Subjective/Objective Assessment:   dx sirs  admit- lives with spouse. pta indep.     Action/Plan:   pt eval- no pt needs.   Anticipated DC Date:  09/23/2013   Anticipated DC Plan:  HOME/SELF CARE      DC Planning Services  CM consult      Choice offered to / List presented to:             Status of service:  Completed, signed off Medicare Important Message given?   (If response is "NO", the following Medicare IM given date fields will be blank) Date Medicare IM given:   Date Additional Medicare IM given:    Discharge Disposition:  HOME/SELF CARE  Per UR Regulation:  Reviewed for med. necessity/level of care/duration of stay  If discussed at Long Length of Stay Meetings, dates discussed:    Comments:  09/23/13 14:26 Letha Cape RN, BSN [phone] patient lives with spouse, pta indep.  Patient for swallow eval today, then for possbile dc.

## 2013-09-23 NOTE — Discharge Summary (Signed)
  I have seen and examined the patient, and reviewed the daily progress note by Jule Ser, MS 3 and discussed the care of the patient with them. Please see my progress note from 09/23/2013 for further details regarding assessment and plan.    Signed:  Vivi Barrack, MD 09/23/2013, 4:12 PM

## 2013-09-23 NOTE — H&P (Signed)
  I have seen and examined the patient myself, and I have reviewed the note by Jule Ser, MS 3 and was present during the interview and physical exam.  Please see my separate H&P for additional findings, assessment, and plan.   Signed: Vivi Barrack, MD 09/23/2013, 7:00 AM

## 2013-09-23 NOTE — Discharge Summary (Signed)
Physician Discharge Summary  Patient ID: Brandon Taylor MRN: [id number] DOB/AGE: 73/05/1941 73 y.o.  Admit date: 09/22/2013 Discharge date: 09/23/2013  Admission Diagnoses: SIRS   Discharge Diagnoses: SIRS, weakness 2/2 hypovolemia, diabetic ketoacidosis  Hospital Course: Brandon Taylor is a 72 year-old gentleman with a past medical history significant for alcoholism, tobacco abuse, abdominal trauma (resulting in multiple GI surgeries in the 1970's), who was transported by EMS to the ED after collapsing to the ground, feeling weak and speaking on the morning of 09/23/13.  He states that he arose from a seated position, walked several yards, felt his legs gave out and he went down.  Prior to this, he had eaten one meal in the preceding 48 hours.  EMS was called and he was transported to the hospital.  He endorsed significant improvement in his weakness after eating crackers and peanut butter in the ED.  Physical exam was significant for a very skinny man with dry mucous membranes, tachycardia and scars scattered about his abdomen following multiple surgeries.  CT scans of both the head and abdomen were unremarkable for any acute pathology and CXR did not identify any acute pathology.  His serum chemistries were significant for an anion gap of 19 and delta-delta of 3.5, consistent with a metabolic acidosis and respiratory alkalosis.  His CBC was significant for leukopenia at 20.0, neutrophilia at 18.0, hemoglobin 13.1 and hematocrit of 37.9 with a MCV of 102.2, consistent with macrocytic anemia, coroborated by a B12 level of 131 and iron of 28.  His glucose was 60 while in the ED.  Alcohol was negative.  Urinalysis revealed a glucose level of 25, large hemoglobinuria, ketone level in the urine of 15 and proteinuria of 30.  EKG demonstrated normal sinus rhythm with frequent PVCs and LAD.  Barium swallow, performed on 09/23/13, demonstrated an ability to consume a 13 mm barium tablet, Distal esophageal spasm and  esophageal dysmotility without mass or ulceration. There was no fixed stricture. There was no appreciable hiatal hernia or  reflux demonstrated.  A hospital course, arranged by problem, follows:  SIRS: He was found to meet SIRS criteria, though no obvious site of infection could be identified.  His urinalysis was negative for leukocytes or bacteria, making a UTI unlikely.  His CXR did not demonstrate any focal consolidations, making pneumonia unlikely.  However, his leukocytosis dropped to 12.6 within twelve hours of admission with no intervention and he experienced no symptoms.  He was not discharged on any medications.  Weakness: Brandon Taylor weakness has been attributed to a combination of dehydration and hypoglycemia since Brandon Taylor had not had sufficient food of fluid intake over the past two days.  The increased anion gap is consistent with alcoholic acidosis.    Excess alcohol intake: Brandon Taylor likely has alcohol dependence, based on his longstanding alcohol intake and history of DTs upon prior admission.  His chronic alcoholism was complicated by a low B12 level.  He did not develop any signs of withdrawal while hospitalized.  While hospitalized, he was given 1000 mcg B12, an oral thiamine supplement, and IV fluid containing dextrose, thiamine, folic acid and a multivitamin.  He was not discharged with any medications.  Weight loss: Brandon Taylor has experienced a 100-pound unintentional weight loss over the past two years.  This is likely due to a combination of short bowel syndrome and malignancy, particularly in the setting of a 160 pack-year smoking history. Lack of hematochezia or changes in bowel movements reduce the likelihood of  colonic malignancy. History of smoking and excess alcohol intake suggest possible esophageal cancer.  This is deferred to the outpatient setting.  He was not discharged with any medical management for his weight.  Dysphagia: Brandon Taylor endorsed dysphagia of indeterminate  duration.  Based on his barium swallow, he was determined to have esophageal spasm without stricture.  Further management, including endoscopy, is deferred to the outpatient setting.  Anemia: After rehydration, Brandon Taylor demonstrated an hemoglobin of 9.9.  This, in the context of a diminished B12 level and increased MCV, indicate anemia, likely due to a combination of marrow suppression from chronic alcoholism and B12 deficiency.  Further examination and management are deferred to the outpatient setting.  Hypoglycemia: this is likely secondary to poor food and fluid intake over the two days preceding hospitalization.  Abnormal EKG. EKG demonstrated sinus tachycardia, frequent PVC's noted, and left axis deviation.    Discharge Exam: Blood pressure 152/71, pulse 73, temperature 98.2 F (36.8 C), temperature source Oral, resp. rate 20, height 6' (1.829 m), weight 55.9 kg (123 lb 3.8 oz), SpO2 97.00%. Physical Exam deferred to Dr. Claudell Kyle  Disposition: Final discharge disposition not confirmed  Discharge Orders   Future Orders Complete By Expires   Diet - low sodium heart healthy  As directed    Increase activity slowly  As directed        Medication List         aspirin EC 81 MG tablet  Take 1 tablet (81 mg total) by mouth daily.     folic acid 1 MG tablet  Commonly known as:  FOLVITE  Take 1 tablet (1 mg total) by mouth daily.     pantoprazole 40 MG tablet  Commonly known as:  PROTONIX  Take 1 tablet (40 mg total) by mouth daily before breakfast.     thiamine 100 MG tablet  Take 1 tablet (100 mg total) by mouth daily.           Follow-up Information   Follow up with Trinna Post, MD. Schedule an appointment as soon as possible for a visit in 1 week.   Specialty:  Family Medicine   Contact information:   [address] [place] [address] [phone]       Signed: Lang Snow 09/23/2013, 3:10 PM

## 2013-09-23 NOTE — Progress Notes (Signed)
Subjective: Patient seen and examined at the bedside. He feels back to normal. No chest pain, shortness of breath, weakness. He appears somewhat resistant to medical follow up, but I had a conversation with his wife about how important this was. She understood and gave me the contact information for her PCP.  Objective: Vital signs in last 24 hours: Filed Vitals:   09/23/13 1249 09/23/13 1250 09/23/13 1251 09/23/13 1502  BP: 167/69 162/74 166/83 152/71  Pulse: 75 73 75 73  Temp:    98.2 F (36.8 C)  TempSrc:    Oral  Resp:      Height:      Weight:      SpO2: 98% 98% 100% 97%   Weight change:   Intake/Output Summary (Last 24 hours) at 09/23/13 1510 Last data filed at 09/23/13 0756  Gross per 24 hour  Intake   1625 ml  Output    600 ml  Net   1025 ml   Physical Exam  Constitutional: He is oriented to person, place, and time. He appears malnourished. No distress.  Thin extremities.  HENT:  Head: Normocephalic and atraumatic.  Mouth/Throat: Mucous membranes are dry. No oropharyngeal exudate, posterior oropharyngeal edema or posterior oropharyngeal erythema.  Eyes: Conjunctivae and EOM are normal. Pupils are equal, round, and reactive to light.  Neck: Normal range of motion. Neck supple.  Cardiovascular: Regular rhythm, normal heart sounds and intact distal pulses. Tachycardia present. Exam reveals no gallop and no friction rub.  No murmur heard.  Pulmonary/Chest: Effort normal and breath sounds normal. No respiratory distress. He has no wheezes. He has no rales. He exhibits no tenderness.  Abdominal: Soft. Bowel sounds are normal. He exhibits no distension. There is no tenderness.  Scars scattered over abdomen from multiple surgeries.  Musculoskeletal: Normal range of motion. He exhibits no edema and no tenderness.  Neurological: He is alert and oriented to person, place, and time. He has normal reflexes. He displays facial symmetry and normal speech. No cranial nerve deficit  or sensory deficit. He exhibits normal muscle tone. Coordination normal. GCS score is 15.  Skin: Skin is warm and dry. He is not diaphoretic.  Psychiatric:  Irritable.   Lab Results: Basic Metabolic Panel:  Recent Labs Lab 09/22/13 1134 09/23/13 0215  NA 144 143  K 4.7 4.2  CL 103 112  CO2 22 25  GLUCOSE 124* 107*  BUN 26* 21  CREATININE 1.14 1.30  CALCIUM 9.0 7.7*   Liver Function Tests:  Recent Labs Lab 09/22/13 1134  AST 36  ALT 12  ALKPHOS 76  BILITOT 0.3  PROT 7.4  ALBUMIN 3.8   No results found for this basename: LIPASE, AMYLASE,  in the last 168 hours No results found for this basename: AMMONIA,  in the last 168 hours CBC:  Recent Labs Lab 09/22/13 1134 09/22/13 1420 09/23/13 0215  WBC 20.0* 17.3* 12.6*  NEUTROABS 18.0* 15.2*  --   HGB 13.1 11.1* 9.9*  HCT 37.9* 32.3* 28.4*  MCV 102.2* 101.9* 100.4*  PLT 264 224 208   Cardiac Enzymes:  Recent Labs Lab 09/22/13 2110 09/23/13 0215 09/23/13 0801  CKTOTAL 820*  --   --   TROPONINI <0.30 <0.30 <0.30   BNP: No results found for this basename: PROBNP,  in the last 168 hours D-Dimer: No results found for this basename: DDIMER,  in the last 168 hours CBG:  Recent Labs Lab 09/22/13 1555 09/22/13 2052 09/23/13 0031 09/23/13 0523 09/23/13 0755 09/23/13 1151  GLUCAP 89 77 107* 94 88 144*   Hemoglobin A1C:  Recent Labs Lab 09/22/13 2110  HGBA1C 5.6   Fasting Lipid Panel: No results found for this basename: CHOL, HDL, LDLCALC, TRIG, CHOLHDL, LDLDIRECT,  in the last 168 hours Thyroid Function Tests:  Recent Labs Lab 09/22/13 2110  TSH 0.191*   Coagulation: No results found for this basename: LABPROT, INR,  in the last 168 hours Anemia Panel:  Recent Labs Lab 09/22/13 2110  VITAMINB12 131*  FOLATE 14.6  FERRITIN 357*  TIBC 237  IRON 28*  RETICCTPCT 0.8   Urine Drug Screen: Drugs of Abuse     Component Value Date/Time   LABOPIA NONE DETECTED 09/22/2013 1253    COCAINSCRNUR NONE DETECTED 09/22/2013 1253   LABBENZ NONE DETECTED 09/22/2013 1253   AMPHETMU NONE DETECTED 09/22/2013 1253   THCU NONE DETECTED 09/22/2013 1253   LABBARB NONE DETECTED 09/22/2013 1253    Alcohol Level:  Recent Labs Lab 09/22/13 1134  ETH <11   Urinalysis:  Recent Labs Lab 09/22/13 1253  COLORURINE YELLOW  LABSPEC 1.015  PHURINE 5.0  GLUCOSEU 250*  HGBUR LARGE*  BILIRUBINUR NEGATIVE  KETONESUR 15*  PROTEINUR 30*  UROBILINOGEN 0.2  NITRITE NEGATIVE  LEUKOCYTESUR NEGATIVE    Micro Results: No results found for this or any previous visit (from the past 240 hour(s)). Studies/Results: Ct Abdomen Pelvis Wo Contrast  09/22/2013   CLINICAL DATA:  Abdominal pain.  EXAM: CT ABDOMEN AND PELVIS WITHOUT CONTRAST  TECHNIQUE: Multidetector CT imaging of the abdomen and pelvis was performed following the standard protocol without intravenous contrast.  COMPARISON:  None.  FINDINGS: The lung bases are clear.  No renal, ureteral, or bladder calculi. No obstructive uropathy. No perinephric stranding is seen. The kidneys are symmetric in size without evidence for exophytic mass. The bladder is unremarkable.  The liver demonstrates no focal abnormality. The gallbladder is unremarkable. 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 The adrenal glands and pancreas are normal.  The unopacified stomach, duodenum, small intestine and large intestine are unremarkable, but evaluation is limited by lack of oral contrast. There is no pneumoperitoneum, pneumatosis, or portal venous gas. There is no abdominal or pelvic free fluid. There is no lymphadenopathy.  The abdominal aorta is normal in caliber.  There is bilateral sacroiliitis.  IMPRESSION: 1.  No acute abdominal or pelvic pathology.  2. Bilateral sacroiliitis as can be seen with inflammatory bowel disease, ankylosing spondylitis versus osteoarthritis.   Electronically Signed   By: Elige Ko   On: 09/22/2013 16:01   Dg Chest 2  View  09/23/2013   CLINICAL DATA:  Cough.  EXAM: CHEST  2 VIEW  COMPARISON:  Chest radiograph 09/22/2013  FINDINGS: Stable cardiac and mediastinal contours. Persistent chronic blunting of the bilateral costophrenic angles. Minimal scarring left lung base. No large consolidative pulmonary opacities. No pleural effusion or pneumothorax. Regional skeleton is unremarkable.  IMPRESSION: No acute cardiopulmonary process.   Electronically Signed   By: Annia Belt M.D.   On: 09/23/2013 07:51   Dg Chest 2 View  09/22/2013   CLINICAL DATA:  Altered mental status  EXAM: CHEST  2 VIEW  COMPARISON:  None.  FINDINGS: The heart size and mediastinal contours are within normal limits. There is no focal infiltrate, pulmonary edema, or pleural effusion. There is mild scoliosis of spine.  IMPRESSION: No active cardiopulmonary disease.   Electronically Signed   By: Sherian Rein M.D.   On: 09/22/2013 12:41   Ct Head Wo Contrast  09/22/2013   CLINICAL DATA:  Nausea since 4 o'clock  EXAM: CT HEAD WITHOUT CONTRAST  TECHNIQUE: Contiguous axial images were obtained from the base of the skull through the vertex without intravenous contrast.  COMPARISON:  None.  FINDINGS: There is no evidence of mass effect, midline shift, or extra-axial fluid collections. There is no evidence of a space-occupying lesion or intracranial hemorrhage. There is no evidence of a cortical-based area of acute infarction. There is generalized cerebral atrophy. There is periventricular white matter low attenuation likely secondary to microangiopathy.  The ventricles and sulci are appropriate for the patient's age. The basal cisterns are patent.  Visualized portions of the orbits are unremarkable. The visualized portions of the paranasal sinuses and mastoid air cells are unremarkable. Cerebrovascular atherosclerotic calcifications are noted.  The osseous structures are unremarkable.  IMPRESSION: No acute intracranial pathology.   Electronically Signed   By:  Elige Ko   On: 09/22/2013 12:15   Dg Esophagus  09/23/2013   CLINICAL DATA:  Dysphagia  EXAM: ESOPHOGRAM/BARIUM SWALLOW  TECHNIQUE: Fluoroscopic and image evaluation of the esophagus was performed using thick and thin liquid barium. The patient also consumed a 13 mm barium tablet under fluoroscopic guidance.  COMPARISON:  None.  FLUOROSCOPY TIME:  3 min, 8 seconds  FINDINGS: The patient swallows readily. There is esophageal dysmotility with spasm and tertiary contractions distally.  There is no hiatal hernia or reflux. There is no demonstrable fixed stricture. There is no esophageal mass or ulceration. Pharynx appears normal.  13 mm barium tablet passed freely to the distal esophagus near the gastroesophageal junction. At this point, there was spasm; the tablet head a partially dissolved before passing into the stomach.  IMPRESSION: Distal esophageal spasm and esophageal dysmotility. No mass or ulceration. No fixed stricture. No appreciable hiatal hernia or reflux demonstrated.   Electronically Signed   By: Bretta Bang M.D.   On: 09/23/2013 14:32   Medications: I have reviewed the patient's current medications. Scheduled Meds: . antiseptic oral rinse  15 mL Mouth Rinse q12n4p  . chlorhexidine  15 mL Mouth Rinse BID  . feeding supplement (ENSURE COMPLETE)  237 mL Oral BID BM  . folic acid  1 mg Oral Daily  . heparin  5,000 Units Subcutaneous Q8H  . LORazepam  0-4 mg Intravenous Q6H   Followed by  . [START ON 09/24/2013] LORazepam  0-4 mg Intravenous Q12H  . pantoprazole (PROTONIX) IV  40 mg Intravenous Q12H  . sodium chloride  3 mL Intravenous Q12H  . thiamine  100 mg Oral Daily   Continuous Infusions:  PRN Meds:.LORazepam, LORazepam, ondansetron (ZOFRAN) IV, ondansetron Assessment/Plan: Brandon Taylor is a 72 y.o.man PMH alcoholism, tobacco abuse, abdominal trauma leading to multiple GI surgeries in the 1970's, who was transported by EMS to the ED after collapsing to the ground,  feeling weak and speaking incoherently this morning.   #Transient weakness - Likely multifactorial from dehydration, alcohol consumption, hypoglycemia, rhabdomyolysis. Lab works significant for B12 deficiency with macrocytic anemia (Hgb 11.1, MCV 101.9) and history of bowel resection surgeries plus alcoholism. - Banana bag @100cc /hr  - Vitamin B12 > 131 (low) > gave 1000 mcg IM - Smear review  - Folate > 14.6 (wnl)  - Cycle troponins > neg x3 - Repeat EKG in am > no more PVCs - PT eval and treat > No f/u - Regular diet - Medically stable for discharge home  #SIRS - Patient met SIRS criteria on admission with a leukocytosis of 20.0  with left shift to 90%, tachycardia, hypothermia to 96. This morning leukocytosis is downtrending as below. Tachycardia, hypothermia have resolved. Likely etiology was rhabdomyolysis. - Blood cultures x2  - Routine vital signs - Lactic acid > 0.8 (wnl)  - Repeat CXR to evaluate for pneumonia after hydration > wnl  WBC  Date Value Range Status  09/23/2013 12.6* 4.0 - 10.5 K/uL Final  09/22/2013 17.3* 4.0 - 10.5 K/uL Final  09/22/2013 20.0* 4.0 - 10.5 K/uL Final    #Excessive alcohol intake - Mr. Flud likely has alcohol dependence, based on his longstanding high alcohol intake and history of DTs. On admission he was showing early signs of alcohol withdrawal, including tachycardia and irritability. All vital signs are stable this morning. CIWAs have been consistently 1. He has not gotten any Ativan today. - CIWA protocol with Ativan  - Folate 1mg  daily  - Thiamine 100mg  daily  - Seizure precautions   #Rhabdomyolysis - UA with large hemoglobin but few RBCs suggesting myoglobinuria. CK 820 (elevated). - Fluid resuscitation as above - Continue to monitor for AKI  #Metabolic acidosis - Anion gap on admission was 19 with delta gap of 3.5, suggesting combined metabolic acidosis and compensated respiratory acidosis. Likely 2/2 alcohol vs. starvation ketoacidosis.  AG improved to 6 this morning. - Continue to monitor  #Chronic weight loss - Patient reports a 100lb weight loss over the past 2 years. He has not had regular follow up with the medical system. Differential includes malabsorption syndrome vs. longstanding COPD vs. Malignancy. - Nutrition consult  - SLP eval and treat > suspect GERD, recommend regular diet - Ferritin > 357 (high)  - Iron > 28 (low)  - TIBC > 237 (wnl)  - Saturation ratios > 12% (low)  - Reticulocytes > 0.8% (wnl)  - TSH > 0.191 (low)  - Free T4 >  - HIV ab > negative  - Hemoglobin A1C > 5.6 (wnl)  - CBG q4h  - Barium swallowing study > Distal esophageal spasm and esophageal dysmotility. No mass or  ulceration. No fixed stricture. No appreciable hiatal hernia or  reflux demonstrated.  #GERD - Protonix IV   #DVT - Heparin subq   Dispo: Disposition is deferred at this time, awaiting improvement of current medical problems.  Anticipated discharge in approximately 1-3 day(s).   The patient does have a current PCP (Brandon Thomasene Ripple, MD) and does need an [facility] hospital follow-up appointment after discharge.  The patient does not have transportation limitations that hinder transportation to clinic appointments.  .Services Needed at time of discharge: Y = Yes, Blank = No PT:   OT:   RN:   Equipment:   Other:     LOS: 1 day   Vivi Barrack, MD 09/23/2013, 3:10 PM

## 2013-09-23 NOTE — Evaluation (Addendum)
Physical Therapy Evaluation Patient Details Name: Brandon Taylor MRN: [id number] DOB: 12/19/1940 Today's Date: 09/23/2013 Time: 4098-1191 PT Time Calculation (min): 8 min  PT Assessment / Plan / Recommendation History of Present Illness  72 y.o. male with h/o EtOH abuse admitted with fall,  SIRS.  Clinical Impression  *Pt ambulated independently 200' without assistive device. No further PT needed. DC PT. OK to DC home from PT standpoint.**    PT Assessment  Patent does not need any further PT services    Follow Up Recommendations  No PT follow up    Does the patient have the potential to tolerate intense rehabilitation      Barriers to Discharge        Equipment Recommendations  None recommended by PT    Recommendations for Other Services     Frequency      Precautions / Restrictions Precautions Precautions: None Restrictions Weight Bearing Restrictions: No   Pertinent Vitals/Pain *0/10 pain**      Mobility  Bed Mobility Bed Mobility: Supine to Sit Supine to Sit: 7: Independent Transfers Transfers: Sit to Stand;Stand to Sit Sit to Stand: 7: Independent;From bed Stand to Sit: 7: Independent;To chair/3-in-1 Ambulation/Gait Ambulation/Gait Assistance: 7: Independent Ambulation Distance (Feet): 200 Feet Assistive device: None Gait Pattern: Within Functional Limits Gait velocity: WNL General Gait Details: 1 mild LOB, pt able to self correct; pt/wife both stated this is baseline for pt if he tries to walk too fast, pt denies falls at home    Exercises     PT Diagnosis:    PT Problem List:   PT Treatment Interventions:       PT Goals(Current goals can be found in the care plan section) Acute Rehab PT Goals Patient Stated Goal: to go home PT Goal Formulation: No goals set, d/c therapy  Visit Information  Last PT Received On: 09/23/13 Assistance Needed: +1 History of Present Illness: 72 y.o. male with h/o EtOH abuse admitted with fall,  SIRS.        Prior Functioning  Home Living Family/patient expects to be discharged to:: Private residence Living Arrangements: Spouse/significant other Available Help at Discharge: Family Home Access: Stairs to enter Entrance Stairs-Number of Steps: 3 Home Equipment: Environmental consultant - 2 wheels;Cane - single point Prior Function Level of Independence: Independent Communication Communication: No difficulties    Cognition  Cognition Arousal/Alertness: Awake/alert Behavior During Therapy: WFL for tasks assessed/performed Overall Cognitive Status: Within Functional Limits for tasks assessed    Extremity/Trunk Assessment Upper Extremity Assessment Upper Extremity Assessment: Overall WFL for tasks assessed Lower Extremity Assessment Lower Extremity Assessment: Overall WFL for tasks assessed Cervical / Trunk Assessment Cervical / Trunk Assessment: Normal   Balance    End of Session PT - End of Session Activity Tolerance: Patient tolerated treatment well Patient left: in chair Nurse Communication: Mobility status  GP    PT G-Codes **NOT FOR INPATIENT CLASS** Functional Assessment Tool Used clinical judgement clinical judgement at 1334 on 09/23/13 by Alvester Morin, PT Functional Limitation Mobility: Walking and moving around Mobility: Walking and moving around at 1334 on 09/23/13 by Alvester Morin, PT Mobility: Walking and Moving Around Current Status [phone]) 0 percent impaired, limited or restricted CH at 1334 on 09/23/13 by Alvester Morin, PT Mobility: Walking and Moving Around Goal Status [phone]) 0 percent impaired, limited or restricted CH at 1334 on 09/23/13 by Alvester Morin, PT Mobility: Walking and Moving Around Discharge Status [phone]) 0 percent impaired, limited or restricted  Ralene Bathe Kistler 09/23/2013, 1:34 PM [phone]

## 2013-09-23 NOTE — Progress Notes (Signed)
INITIAL NUTRITION ASSESSMENT  DOCUMENTATION CODES Per approved criteria  -Severe malnutrition in the context of chronic illness -Underweight   INTERVENTION: Add Ensure Complete po BID, each supplement provides 350 kcal and 13 grams of protein and MVI daily. RD provided "Ideas for Increasing Protein and Calories" handout to wife and patient. RD to continue to follow nutrition care plan.  NUTRITION DIAGNOSIS: Unintentional wt loss related to altered GI function as evidenced by severe fat and muscle mass loss.   Goal: Advance diet as tolerated. Intake to meet at least 90% of estimated nutrition needs.  Monitor:  weight trends, lab trends, I/O's, diet advancement  Reason for Assessment: MD Consult  72 y.o. male  Admitting Dx: SIRS (systemic inflammatory response syndrome)  ASSESSMENT: PMHx significant for GERD and ETOH abuse. Pt has had "extensive surgical repair for abdominal trauma in the 70's." Admitted after collapsing to the ground, feeling weak and speaking incoherently this morning. The patient endorses significant consumption of alcohol; he states that he drank "a fifth" of liquor last night and drinks most nights per week, but could not precisely quantify his drinking, though his wife states that he drinks most nights and has drank for 30+ years. His last drink was last night.   Work-up reveals SIRS. BSE completed by SLP prior to RD's arrival - pt approved for Regular diet and thin liquids.  Most of nutrition hx was obtained from wife. Husband appears very thin. We discussed how he got to be underweight. Per wife, pt had a major intestinal surgery in the 1970s, pt weighed about 200 lb at that time. Per wife, pt had a good portion of his GI tract removed and she said the MD told her that the pt would lose weight 2/2 malabsorption. Pt has lost about 70 lb over the past 40 years. Wife reports that it didn't all "come off at once." Pt eats high calorie, high fat foods at baseline.  Eats pinto beans cooked in animal fat for every meal. Eats about 2-3 meals a day. We did not discuss his drinking habits. Pt denies taking snacks or oral nutrition supplements, but later admits that he likes peanut butter crackers. We reviewed ways to increase his calories and protein. Pt agreeable to trying Ensure while here. Handouts provided to help increase weight gain.  Nutrition Focused Physical Exam:  Subcutaneous Fat:  Orbital Region: severe depletion Upper Arm Region: severe depletion Thoracic and Lumbar Region: severe depletion  Muscle:  Temple Region: severe depletion Clavicle Bone Region: severe depletion Clavicle and Acromion Bone Region: severe depletion Scapular Bone Region: severe depletion Dorsal Hand: severe depletion Patellar Region: severe depletion Anterior Thigh Region: severe depletion Posterior Calf Region: severe depletion  Edema: none  Pt meets criteria for severe MALNUTRITION in the context of chronic illness as evidenced by severe fat and muscle mass loss.   Height: Ht Readings from Last 1 Encounters:  09/22/13 6' (1.829 m)    Weight: Wt Readings from Last 1 Encounters:  09/22/13 123 lb 3.8 oz (55.9 kg)    Ideal Body Weight: 178 lb  % Ideal Body Weight: 69%  Wt Readings from Last 10 Encounters:  09/22/13 123 lb 3.8 oz (55.9 kg)    Usual Body Weight: n/a  % Usual Body Weight: n/a  BMI:  Body mass index is 16.71 kg/(m^2). Underweight  Estimated Nutritional Needs: Kcal: 1600 - 1800 Protein: 70 -  85 g Fluid: 1.6 - 1.8 liters  Skin: intact  Diet Order: NPO  EDUCATION NEEDS: -No  education needs identified at this time   Intake/Output Summary (Last 24 hours) at 09/23/13 1010 Last data filed at 09/23/13 0756  Gross per 24 hour  Intake   2625 ml  Output    600 ml  Net   2025 ml    Last BM: 12/11   Labs:   Recent Labs Lab 09/22/13 1134 09/23/13 0215  NA 144 143  K 4.7 4.2  CL 103 112  CO2 22 25  BUN 26* 21   CREATININE 1.14 1.30  CALCIUM 9.0 7.7*  GLUCOSE 124* 107*    CBG (last 3)   Recent Labs  09/23/13 0031 09/23/13 0523 09/23/13 0755  GLUCAP 107* 94 88    Scheduled Meds: . antiseptic oral rinse  15 mL Mouth Rinse q12n4p  . chlorhexidine  15 mL Mouth Rinse BID  . folic acid  1 mg Oral Daily  . heparin  5,000 Units Subcutaneous Q8H  . influenza vac split quadrivalent PF  0.5 mL Intramuscular Tomorrow-1000  . LORazepam  0-4 mg Intravenous Q6H   Followed by  . [START ON 09/24/2013] LORazepam  0-4 mg Intravenous Q12H  . pantoprazole (PROTONIX) IV  40 mg Intravenous Q12H  . pneumococcal 23 valent vaccine  0.5 mL Intramuscular Tomorrow-1000  . sodium chloride  3 mL Intravenous Q12H  . thiamine  100 mg Oral Daily    Continuous Infusions:   Past Medical History  Diagnosis Date  . GERD (gastroesophageal reflux disease)   . ETOH abuse     Past Surgical History  Procedure Laterality Date  . No past surgeries      Jarold Motto MS, RD, LDN Pager: [phone] After-hours pager: [phone]

## 2013-09-29 LAB — CULTURE, BLOOD (ROUTINE X 2): Culture: NO GROWTH

## 2013-10-03 ENCOUNTER — Ambulatory Visit: Payer: Medicare Other | Attending: Internal Medicine | Admitting: Internal Medicine

## 2013-10-03 VITALS — BP 138/70 | HR 81 | Temp 98.0°F | Resp 16 | Ht 70.0 in | Wt 134.0 lb

## 2013-10-03 DIAGNOSIS — R634 Abnormal weight loss: Secondary | ICD-10-CM

## 2013-10-03 DIAGNOSIS — F1010 Alcohol abuse, uncomplicated: Secondary | ICD-10-CM

## 2013-10-03 DIAGNOSIS — Z720 Tobacco use: Secondary | ICD-10-CM | POA: Insufficient documentation

## 2013-10-03 DIAGNOSIS — E538 Deficiency of other specified B group vitamins: Secondary | ICD-10-CM | POA: Insufficient documentation

## 2013-10-03 DIAGNOSIS — F17200 Nicotine dependence, unspecified, uncomplicated: Secondary | ICD-10-CM

## 2013-10-03 DIAGNOSIS — R1310 Dysphagia, unspecified: Secondary | ICD-10-CM | POA: Insufficient documentation

## 2013-10-03 MED ORDER — CYANOCOBALAMIN 1000 MCG/ML IJ SOLN: 1000.0000 ug | INTRAMUSCULAR | Status: DC

## 2013-10-03 NOTE — Patient Instructions (Signed)

## 2013-10-03 NOTE — Progress Notes (Signed)
Patient ID: Brandon Taylor, male   DOB: 07/05/1941, 72 y.o.   MRN: [id number] Patient Demographics  Brandon Taylor, is a 72 y.o. male  YQM:[id number]  BMW:[id number]  DOB - June 27, 1941  CC:  Chief Complaint  Patient presents with  . Hospitalization Follow-up       HPI: Brandon Taylor is a 72 y.o. male here today to establish medical care. He has past medical history significant for alcoholism, tobacco abuse, abdominal trauma (resulting in multiple GI surgeries in the 1970's). He was seen in the ER recently for dizziness and generalized body weakness, patient claims he has been drinking alcohol and not eating food for 3 days prior to this episode, he was extensively worked up in the ER and found to have vitamin B12 deficiency among other abnormalities. He was then referred to our clinic for vitamin B12 injections and further workup for his chronic weight loss. Patient claims to have lost about 100 pounds in the last couple of years, although he said he does not eat well per drinks alcohol daily. He continued to smoke heavily about 1 pack per day and he's not ready to quit when asked today. Today he has no complaint, he said he feels much better than when he went to the ER, he claims he is able to walk now without dizziness, he has no cough, no blood in stool, denies chest pain. No diarrhea. No personal history of hypertension or diabetes, no history of malignancy. He has not been seen by a physician for a long time, no prior colonoscopy done. Patient has No headache, No chest pain, No abdominal pain - No Nausea, No new weakness tingling or numbness, No Cough - SOB.  No Known Allergies Past Medical History  Diagnosis Date  . GERD (gastroesophageal reflux disease)   . ETOH abuse    Current Outpatient Prescriptions on File Prior to Visit  Medication Sig Dispense Refill  . aspirin EC 81 MG tablet Take 1 tablet (81 mg total) by mouth daily.  30 tablet  11  . folic acid (FOLVITE) 1 MG tablet Take 1 tablet (1  mg total) by mouth daily.  30 tablet  11  . pantoprazole (PROTONIX) 40 MG tablet Take 1 tablet (40 mg total) by mouth daily before breakfast.  30 tablet  11  . thiamine 100 MG tablet Take 1 tablet (100 mg total) by mouth daily.  30 tablet  11   No current facility-administered medications on file prior to visit.   Family History  Problem Relation Age of Onset  . COPD Brother    History   Social History  . Marital Status: Married    Spouse Name: N/A    Number of Children: N/A  . Years of Education: N/A   Occupational History  . Not on file.   Social History Main Topics  . Smoking status: Current Every Day Smoker -- 2.00 packs/day for 40 years  . Smokeless tobacco: Not on file  . Alcohol Use: Yes     Comment: fifth daily  . Drug Use: No  . Sexual Activity: Not on file   Other Topics Concern  . Not on file   Social History Narrative  . No narrative on file    Review of Systems: Constitutional: Negative for fever, chills, diaphoresis, activity change, appetite change and fatigue. HENT: Negative for ear pain, nosebleeds, congestion, facial swelling, rhinorrhea, neck pain, neck stiffness and ear discharge.  Eyes: Negative for pain, discharge, redness, itching and visual disturbance. Respiratory:  Negative for cough, choking, chest tightness, shortness of breath, wheezing and stridor.  Cardiovascular: Negative for chest pain, palpitations and leg swelling. Gastrointestinal: Negative for abdominal distention. Genitourinary: Negative for dysuria, urgency, frequency, hematuria, flank pain, decreased urine volume, difficulty urinating and dyspareunia.  Musculoskeletal: Negative for back pain, joint swelling, arthralgia and gait problem. Neurological: Negative for dizziness, tremors, seizures, syncope, facial asymmetry, speech difficulty, weakness, light-headedness, numbness and headaches.  Hematological: Negative for adenopathy. Does not bruise/bleed easily. Psychiatric/Behavioral:  Negative for hallucinations, behavioral problems, confusion, dysphoric mood, decreased concentration and agitation.    Objective:   Filed Vitals:   10/03/13 0914  BP: 138/70  Pulse: 81  Temp: 98 F (36.7 C)  Resp: 16    Physical Exam: Constitutional: Patient appears well-developed and well-nourished. No distress. Cachexia HENT: Normocephalic, atraumatic, External right and left ear normal. Oropharynx is clear and moist.  Eyes: Conjunctivae and EOM are normal. PERRLA, no scleral icterus. Neck: Normal ROM. Neck supple. No JVD. No tracheal deviation. No thyromegaly. CVS: RRR, S1/S2 +, no murmurs, no gallops, no carotid bruit.  Pulmonary: Effort and breath sounds normal, no stridor, rhonchi, wheezes, rales.  Abdominal: Soft. BS +, no distension, tenderness, rebound or guarding.  Musculoskeletal: Normal range of motion. No edema and no tenderness.  Lymphadenopathy: No lymphadenopathy noted, cervical, inguinal or axillary Neuro: Alert. Normal reflexes, muscle tone coordination. No cranial nerve deficit. Skin: Skin is warm and dry. No rash noted. Not diaphoretic. No erythema. No pallor. Psychiatric: Normal mood and affect. Behavior, judgment, thought content normal.  Lab Results  Component Value Date   WBC 12.6* 09/23/2013   HGB 9.9* 09/23/2013   HCT 28.4* 09/23/2013   MCV 100.4* 09/23/2013   PLT 208 09/23/2013   Lab Results  Component Value Date   CREATININE 1.30 09/23/2013   BUN 21 09/23/2013   NA 143 09/23/2013   K 4.2 09/23/2013   CL 112 09/23/2013   CO2 25 09/23/2013    Lab Results  Component Value Date   HGBA1C 5.6 09/22/2013   Lipid Panel  No results found for this basename: chol, trig, hdl, cholhdl, vldl, ldlcalc       Assessment and plan:   1. Vitamin B12 deficiency - Ambulatory referral to Gastroenterology - cyanocobalamin (,VITAMIN B-12,) 1000 MCG/ML injection; Inject 1 mL (1,000 mcg total) into the muscle every 30 (thirty) days.  Dispense: 30 mL;  Refill: 1  2. Loss of weight  - Ambulatory referral to Gastroenterology for colonoscopy  3. Dysphagia, unspecified(787.20)  - Ambulatory referral to Gastroenterology for EGD  4. Alcohol abuse Patient extensively counseled about nutrition and exercise Patient counseled on reduction of alcohol intake quitting totally  5. Nicotine abuse Vision extensively counseled about smoking cessation although he said he's not ready to quit   Plan:       Health Maintenance -Colonoscopy: Ordered  -Vaccinations:   -PNA (PPSV23) (one dose after 65) (or one dose before 65 if chronic conditions): Up-to-date    -Influenza: Already given  Follow up in 3 months or when necessary  The patient was given clear instructions to go to ER or return to medical center if symptoms don't improve, worsen or new problems develop. The patient verbalized understanding. The patient was told to call to get lab results if they haven't heard anything in the next week.     Jeanann Lewandowsky, MD, MHA, [facility] [place], [place] [phone]   10/03/2013, 9:58 AM

## 2013-10-03 NOTE — Progress Notes (Signed)
HFU Pt has no C.C. Today. He said that he is here to get a shot.

## 2013-11-04 ENCOUNTER — Encounter: Payer: Self-pay | Admitting: Internal Medicine

## 2013-12-21 ENCOUNTER — Ambulatory Visit (HOSPITAL_COMMUNITY)
Admission: RE | Admit: 2013-12-21 | Discharge: 2013-12-21 | Disposition: A | Discharge status: Home / Self Care | Payer: Medicare Other | Source: Ambulatory Visit | Attending: Gastroenterology | Admitting: Gastroenterology

## 2013-12-21 ENCOUNTER — Other Ambulatory Visit (HOSPITAL_COMMUNITY): Payer: Self-pay | Admitting: Gastroenterology

## 2013-12-21 DIAGNOSIS — K5730 Diverticulosis of large intestine without perforation or abscess without bleeding: Secondary | ICD-10-CM | POA: Insufficient documentation

## 2013-12-21 DIAGNOSIS — Z9889 Other specified postprocedural states: Secondary | ICD-10-CM

## 2014-01-02 ENCOUNTER — Ambulatory Visit: Payer: Medicare Other | Attending: Internal Medicine | Admitting: Internal Medicine

## 2014-01-02 VITALS — BP 126/75 | HR 71 | Temp 97.6°F | Resp 16 | Ht 70.0 in | Wt 137.0 lb

## 2014-01-02 DIAGNOSIS — F1010 Alcohol abuse, uncomplicated: Secondary | ICD-10-CM | POA: Insufficient documentation

## 2014-01-02 DIAGNOSIS — R1310 Dysphagia, unspecified: Secondary | ICD-10-CM | POA: Insufficient documentation

## 2014-01-02 DIAGNOSIS — E538 Deficiency of other specified B group vitamins: Secondary | ICD-10-CM

## 2014-01-02 DIAGNOSIS — F17200 Nicotine dependence, unspecified, uncomplicated: Secondary | ICD-10-CM | POA: Insufficient documentation

## 2014-01-02 DIAGNOSIS — D539 Nutritional anemia, unspecified: Secondary | ICD-10-CM | POA: Insufficient documentation

## 2014-01-02 DIAGNOSIS — Z7982 Long term (current) use of aspirin: Secondary | ICD-10-CM | POA: Insufficient documentation

## 2014-01-02 DIAGNOSIS — K219 Gastro-esophageal reflux disease without esophagitis: Secondary | ICD-10-CM | POA: Insufficient documentation

## 2014-01-02 DIAGNOSIS — Z79899 Other long term (current) drug therapy: Secondary | ICD-10-CM | POA: Insufficient documentation

## 2014-01-02 LAB — CBC WITH DIFFERENTIAL/PLATELET
BASOS ABS: 0 10*3/uL (ref 0.0–0.1)
Basophils Relative: 0 % (ref 0–1)
EOS PCT: 1 % (ref 0–5)
Eosinophils Absolute: 0.1 10*3/uL (ref 0.0–0.7)
HCT: 33.6 % — ABNORMAL LOW (ref 39.0–52.0)
Hemoglobin: 11.5 g/dL — ABNORMAL LOW (ref 13.0–17.0)
LYMPHS ABS: 1.2 10*3/uL (ref 0.7–4.0)
LYMPHS PCT: 23 % (ref 12–46)
MCH: 30.6 pg (ref 26.0–34.0)
MCHC: 34.2 g/dL (ref 30.0–36.0)
MCV: 89.4 fL (ref 78.0–100.0)
Monocytes Absolute: 0.5 10*3/uL (ref 0.1–1.0)
Monocytes Relative: 9 % (ref 3–12)
NEUTROS PCT: 67 % (ref 43–77)
Neutro Abs: 3.4 10*3/uL (ref 1.7–7.7)
PLATELETS: 343 10*3/uL (ref 150–400)
RBC: 3.76 MIL/uL — ABNORMAL LOW (ref 4.22–5.81)
RDW: 14.5 % (ref 11.5–15.5)
WBC: 5 10*3/uL (ref 4.0–10.5)

## 2014-01-02 LAB — COMPLETE METABOLIC PANEL WITH GFR
AST: 16 U/L (ref 0–37)
Albumin: 4.1 g/dL (ref 3.5–5.2)
Alkaline Phosphatase: 82 U/L (ref 39–117)
BUN: 16 mg/dL (ref 6–23)
CALCIUM: 9.2 mg/dL (ref 8.4–10.5)
CHLORIDE: 104 meq/L (ref 96–112)
CO2: 28 meq/L (ref 19–32)
CREATININE: 1.16 mg/dL (ref 0.50–1.35)
GFR, Est African American: 71 mL/min
GFR, Est Non African American: 62 mL/min
Glucose, Bld: 78 mg/dL (ref 70–99)
Potassium: 5.2 mEq/L (ref 3.5–5.3)
SODIUM: 141 meq/L (ref 135–145)
TOTAL PROTEIN: 6.7 g/dL (ref 6.0–8.3)
Total Bilirubin: 0.3 mg/dL (ref 0.2–1.2)

## 2014-01-02 LAB — LIPID PANEL
CHOL/HDL RATIO: 2.5 ratio
CHOLESTEROL: 180 mg/dL (ref 0–200)
HDL: 71 mg/dL (ref >39)
LDL Cholesterol: 95 mg/dL (ref 0–99)
TRIGLYCERIDES: 70 mg/dL (ref <150)
VLDL: 14 mg/dL (ref 0–40)

## 2014-01-02 MED ORDER — CYANOCOBALAMIN 1000 MCG/ML IJ SOLN: 1000.0000 ug | Freq: Once | INTRAMUSCULAR | Status: DC

## 2014-01-02 MED ORDER — CYANOCOBALAMIN 1000 MCG/ML IJ SOLN
1000.0000 ug | Freq: Once | INTRAMUSCULAR | Status: AC
  Administered 2014-01-02: 1000 ug via INTRAMUSCULAR

## 2014-01-02 NOTE — Progress Notes (Signed)
Patient ID: Brandon Taylor, male   DOB: 1941-09-21, 73 y.o.   MRN: [id number]   CC:  HPI: 73 year old male with a history of alcoholism here for followup the patient denies using alcohol since December. He continues to smoke 2 packs a day. His last B12 level was 131. He takes sublingual B12 2500 mg a day. He has received only one intramuscular injection since December. Patient was found to have macrocytic anemia his last visit. Patient was provided a colonoscopy referral on 10/03/13.    No Known Allergies Past Medical History  Diagnosis Date  . GERD (gastroesophageal reflux disease)   . ETOH abuse    Current Outpatient Prescriptions on File Prior to Visit  Medication Sig Dispense Refill  . aspirin EC 81 MG tablet Take 1 tablet (81 mg total) by mouth daily.  30 tablet  11  . folic acid (FOLVITE) 1 MG tablet Take 1 tablet (1 mg total) by mouth daily.  30 tablet  11  . pantoprazole (PROTONIX) 40 MG tablet Take 1 tablet (40 mg total) by mouth daily before breakfast.  30 tablet  11  . thiamine 100 MG tablet Take 1 tablet (100 mg total) by mouth daily.  30 tablet  11   No current facility-administered medications on file prior to visit.   Family History  Problem Relation Age of Onset  . COPD Brother    History   Social History  . Marital Status: Married    Spouse Name: N/A    Number of Children: N/A  . Years of Education: N/A   Occupational History  . Not on file.   Social History Main Topics  . Smoking status: Current Every Day Smoker -- 2.00 packs/day for 40 years  . Smokeless tobacco: Not on file  . Alcohol Use: Yes     Comment: fifth daily  . Drug Use: No  . Sexual Activity: Not on file   Other Topics Concern  . Not on file   Social History Narrative  . No narrative on file    Review of Systems  Constitutional: Negative for fever, chills, diaphoresis, activity change, appetite change and fatigue.  HENT: Negative for ear pain, nosebleeds, congestion, facial  swelling, rhinorrhea, neck pain, neck stiffness and ear discharge.   Eyes: Negative for pain, discharge, redness, itching and visual disturbance.  Respiratory: Negative for cough, choking, chest tightness, shortness of breath, wheezing and stridor.   Cardiovascular: Negative for chest pain, palpitations and leg swelling.  Gastrointestinal: Negative for abdominal distention.  Genitourinary: Negative for dysuria, urgency, frequency, hematuria, flank pain, decreased urine volume, difficulty urinating and dyspareunia.  Musculoskeletal: Negative for back pain, joint swelling, arthralgias and gait problem.  Neurological: Negative for dizziness, tremors, seizures, syncope, facial asymmetry, speech difficulty, weakness, light-headedness, numbness and headaches.  Hematological: Negative for adenopathy. Does not bruise/bleed easily.  Psychiatric/Behavioral: Negative for hallucinations, behavioral problems, confusion, dysphoric mood, decreased concentration and agitation.    Objective:   Filed Vitals:   01/02/14 0927  BP: 126/75  Pulse: 71  Temp: 97.6 F (36.4 C)  Resp: 16    Physical Exam  Constitutional: Appears well-developed and well-nourished. No distress.  HENT: Normocephalic. External right and left ear normal. Oropharynx is clear and moist.  Eyes: Conjunctivae and EOM are normal. PERRLA, no scleral icterus.  Neck: Normal ROM. Neck supple. No JVD. No tracheal deviation. No thyromegaly.  CVS: RRR, S1/S2 +, no murmurs, no gallops, no carotid bruit.  Pulmonary: Effort and breath sounds normal, no stridor, rhonchi,  wheezes, rales.  Abdominal: Soft. BS +,  no distension, tenderness, rebound or guarding.  Musculoskeletal: Normal range of motion. No edema and no tenderness.  Lymphadenopathy: No lymphadenopathy noted, cervical, inguinal. Neuro: Alert. Normal reflexes, muscle tone coordination. No cranial nerve deficit. Skin: Skin is warm and dry. No rash noted. Not diaphoretic. No erythema. No  pallor.  Psychiatric: Normal mood and affect. Behavior, judgment, thought content normal.   Lab Results  Component Value Date   WBC 12.6* 09/23/2013   HGB 9.9* 09/23/2013   HCT 28.4* 09/23/2013   MCV 100.4* 09/23/2013   PLT 208 09/23/2013   Lab Results  Component Value Date   CREATININE 1.30 09/23/2013   BUN 21 09/23/2013   NA 143 09/23/2013   K 4.2 09/23/2013   CL 112 09/23/2013   CO2 25 09/23/2013    Lab Results  Component Value Date   HGBA1C 5.6 09/22/2013   Lipid Panel  No results found for this basename: chol, trig, hdl, cholhdl, vldl, ldlcalc       Assessment and plan:   Patient Active Problem List   Diagnosis Date Noted  . Vitamin B12 deficiency 10/03/2013  . Loss of weight 10/03/2013  . Dysphagia, unspecified(787.20) 10/03/2013  . Alcohol abuse 10/03/2013  . Nicotine abuse 10/03/2013  . Hypoglycemia 09/22/2013  . General weakness 09/22/2013  . SIRS (systemic inflammatory response syndrome) 09/22/2013  . GERD (gastroesophageal reflux disease)   . ETOH abuse      Vitamin B12 deficiency  Patient to continue with sublingual B12 Will check levels today CBC today Patient continue with folic acid and thiamine He also takes vitamin B 1   Dysphagia Patient on PPI symptoms today No significant weight loss Patient to followup with gastroenterology   Nicotine dependence Patient not interested to quit smoking today       The patient was given clear instructions to go to ER or return to medical center if symptoms don't improve, worsen or new problems develop. The patient verbalized understanding. The patient was told to call to get any lab results if not heard anything in the next week.

## 2014-01-02 NOTE — Progress Notes (Signed)
Pt is here following up on his Vitamin B12 deficiency.

## 2014-01-02 NOTE — Addendum Note (Signed)
Addended by: Allayne StackWINFREE, Domitila Stetler R on: 01/02/2014 11:03 AM   Modules accepted: Orders

## 2014-01-03 LAB — TSH: TSH: 1.54 u[IU]/mL (ref 0.350–4.500)

## 2014-01-03 LAB — VITAMIN B12: Vitamin B-12: 217 pg/mL (ref 211–911)

## 2014-01-04 MED ORDER — CYANOCOBALAMIN 1000 MCG/ML IJ SOLN: 1000.0000 ug | INTRAMUSCULAR | Status: AC

## 2014-01-04 NOTE — Addendum Note (Signed)
Addended by: Susie CassetteABROL MD, Germain OsgoodNAYANA on: 01/04/2014 10:42 AM   Modules accepted: Orders

## 2014-04-04 ENCOUNTER — Ambulatory Visit: Payer: Medicare Other | Admitting: Internal Medicine

## 2014-10-03 ENCOUNTER — Other Ambulatory Visit: Payer: Self-pay | Admitting: Internal Medicine

## 2014-10-16 ENCOUNTER — Other Ambulatory Visit: Payer: Self-pay | Admitting: Internal Medicine

## 2014-10-16 NOTE — Telephone Encounter (Signed)
Pt is requesting a refill for the following:  folic acid (FOLVITE) 1 MG tablet aspirin EC 81 MG tablet pantoprazole (PROTONIX) 40 MG tablet Vitamin D

## 2014-10-17 NOTE — Telephone Encounter (Signed)
Pt stated has apt on 10/19/2014 with PCP

## 2014-10-23 ENCOUNTER — Other Ambulatory Visit: Payer: Self-pay | Admitting: Emergency Medicine

## 2014-10-23 MED ORDER — THIAMINE HCL 100 MG PO TABS: 100.0000 mg | ORAL_TABLET | Freq: Every day | ORAL | Status: DC

## 2014-10-23 MED ORDER — PANTOPRAZOLE SODIUM 40 MG PO TBEC: 40.0000 mg | DELAYED_RELEASE_TABLET | Freq: Every day | ORAL | Status: DC

## 2014-10-23 MED ORDER — ASPIRIN EC 81 MG PO TBEC: 81.0000 mg | DELAYED_RELEASE_TABLET | Freq: Every day | ORAL | Status: DC

## 2014-10-23 MED ORDER — FOLIC ACID 1 MG PO TABS: 1.0000 mg | ORAL_TABLET | Freq: Every day | ORAL | Status: DC

## 2014-10-24 ENCOUNTER — Ambulatory Visit: Payer: Medicare Other | Attending: Internal Medicine | Admitting: Internal Medicine

## 2014-10-24 ENCOUNTER — Encounter: Payer: Self-pay | Admitting: Internal Medicine

## 2014-10-24 VITALS — BP 147/77 | HR 69 | Temp 97.8°F | Resp 16 | Ht 70.0 in | Wt 133.0 lb

## 2014-10-24 DIAGNOSIS — F1910 Other psychoactive substance abuse, uncomplicated: Secondary | ICD-10-CM

## 2014-10-24 DIAGNOSIS — Z23 Encounter for immunization: Secondary | ICD-10-CM

## 2014-10-24 DIAGNOSIS — Z0000 Encounter for general adult medical examination without abnormal findings: Secondary | ICD-10-CM | POA: Insufficient documentation

## 2014-10-24 DIAGNOSIS — Z720 Tobacco use: Secondary | ICD-10-CM | POA: Insufficient documentation

## 2014-10-24 DIAGNOSIS — E538 Deficiency of other specified B group vitamins: Secondary | ICD-10-CM | POA: Insufficient documentation

## 2014-10-24 LAB — COMPLETE METABOLIC PANEL WITH GFR
ALT: 8 U/L (ref 0–53)
AST: 18 U/L (ref 0–37)
Albumin: 4.2 g/dL (ref 3.5–5.2)
Alkaline Phosphatase: 93 U/L (ref 39–117)
BILIRUBIN TOTAL: 0.4 mg/dL (ref 0.2–1.2)
BUN: 16 mg/dL (ref 6–23)
CO2: 29 meq/L (ref 19–32)
Calcium: 9.4 mg/dL (ref 8.4–10.5)
Chloride: 106 mEq/L (ref 96–112)
Creat: 1.19 mg/dL (ref 0.50–1.35)
GFR, Est African American: 69 mL/min
GFR, Est Non African American: 60 mL/min
Glucose, Bld: 76 mg/dL (ref 70–99)
POTASSIUM: 5.8 meq/L — AB (ref 3.5–5.3)
SODIUM: 143 meq/L (ref 135–145)
TOTAL PROTEIN: 7 g/dL (ref 6.0–8.3)

## 2014-10-24 LAB — GLUCOSE, POCT (MANUAL RESULT ENTRY): POC Glucose: 76 mg/dl (ref 70–99)

## 2014-10-24 LAB — TSH: TSH: 1.407 u[IU]/mL (ref 0.350–4.500)

## 2014-10-24 LAB — POCT GLYCOSYLATED HEMOGLOBIN (HGB A1C): Hemoglobin A1C: 5.9

## 2014-10-24 LAB — VITAMIN B12: Vitamin B-12: 221 pg/mL (ref 211–911)

## 2014-10-24 MED ORDER — THIAMINE HCL 100 MG PO TABS: 100.0000 mg | ORAL_TABLET | Freq: Every day | ORAL | Status: DC

## 2014-10-24 MED ORDER — ASPIRIN EC 81 MG PO TBEC: 81.0000 mg | DELAYED_RELEASE_TABLET | Freq: Every day | ORAL | Status: DC

## 2014-10-24 MED ORDER — VITAMIN B-12 1000 MCG PO TABS: 1000.0000 ug | ORAL_TABLET | Freq: Every day | ORAL | Status: DC

## 2014-10-24 MED ORDER — PANTOPRAZOLE SODIUM 40 MG PO TBEC: 40.0000 mg | DELAYED_RELEASE_TABLET | Freq: Every day | ORAL | Status: DC

## 2014-10-24 MED ORDER — FOLIC ACID 1 MG PO TABS: 1.0000 mg | ORAL_TABLET | Freq: Every day | ORAL | Status: DC

## 2014-10-24 NOTE — Progress Notes (Signed)
Patient ID: Brandon Taylor, male   DOB: 10/31/1940, 74 y.o.   MRN: [id number]   Brandon Taylor, is a 74 y.o. male  [id number]  [id number]  DOB - 09/18/1941  Chief Complaint  Patient presents with  . Medication Refill  . Follow-up        Subjective:   Brandon Taylor is a 74 y.o. male here today for a follow up visit. Patient with history of GERD, remote alcohol abuse and current nicotine abuse came to clinic today for routine visit, and medication refill. Patient has no complaint today. He continues to smoke cigarettes heavily and not ready to quit. He needs refill on his multivitamins and PPI. He used to be an injection vitamin B 12, changed to sublingual but now on tablets. His last vitamin B-12 was 221, within normal range. Patient has No headache, No chest pain, No abdominal pain - No Nausea, No new weakness tingling or numbness, No Cough - SOB.  Problem  Routine General Medical Examination At A Health Care Facility    ALLERGIES: No Known Allergies  PAST MEDICAL HISTORY: Past Medical History  Diagnosis Date  . GERD (gastroesophageal reflux disease)   . ETOH abuse     MEDICATIONS AT HOME: Prior to Admission medications   Medication Sig Start Date End Date Taking? Authorizing Provider  aspirin EC 81 MG tablet Take 1 tablet (81 mg total) by mouth daily. 10/24/14  Yes Quentin Angstlugbemiga E Madoline Bhatt, MD  folic acid (FOLVITE) 1 MG tablet Take 1 tablet (1 mg total) by mouth daily. 10/24/14  Yes Quentin Angstlugbemiga E Janani Chamber, MD  pantoprazole (PROTONIX) 40 MG tablet Take 1 tablet (40 mg total) by mouth daily before breakfast. 10/24/14  Yes Quentin Angstlugbemiga E Bazil Dhanani, MD  thiamine 100 MG tablet Take 1 tablet (100 mg total) by mouth daily. 10/24/14  Yes Quentin Angstlugbemiga E Martavia Tye, MD  vitamin B-12 (CYANOCOBALAMIN) 1000 MCG tablet Take 1 tablet (1,000 mcg total) by mouth daily. 10/24/14   Quentin Angstlugbemiga E Czar Ysaguirre, MD     Objective:   Filed Vitals:   10/24/14 1023  BP: 147/77  Pulse: 69  Temp: 97.8 F (36.6 C)  Resp: 16    Height: 5\' 10"  (1.778 m)  Weight: 133 lb (60.328 kg)  SpO2: 100%    Exam General appearance : Awake, alert, not in any distress. Speech Clear. Not toxic looking HEENT: Atraumatic and Normocephalic, pupils equally reactive to light and accomodation Neck: supple, no JVD. No cervical lymphadenopathy.  Chest:Good air entry bilaterally, no added sounds  CVS: S1 S2 regular, no murmurs.  Abdomen: Bowel sounds present, Non tender and not distended with no gaurding, rigidity or rebound. Extremities: B/L Lower Ext shows no edema, both legs are warm to touch Neurology: Awake alert, and oriented X 3, CN II-XII intact, Non focal Skin:No Rash Wounds:N/A  Data Review Lab Results  Component Value Date   HGBA1C 5.6 09/22/2013     Assessment & Plan   1. Routine general medical examination at a health care facility  - Glucose (CBG)  - pantoprazole (PROTONIX) 40 MG tablet; Take 1 tablet (40 mg total) by mouth daily before breakfast.  Dispense: 90 tablet; Refill: 3  - aspirin EC 81 MG tablet; Take 1 tablet (81 mg total) by mouth daily.  Dispense: 90 tablet; Refill: 3 - CBC with Differential - COMPLETE METABOLIC PANEL WITH GFR - POCT glycosylated hemoglobin (Hb A1C) - TSH - Urinalysis, Complete  2. Vitamin B12 deficiency  - thiamine 100 MG tablet; Take 1 tablet (100 mg  total) by mouth daily.  Dispense: 90 tablet; Refill: 3 - folic acid (FOLVITE) 1 MG tablet; Take 1 tablet (1 mg total) by mouth daily.  Dispense: 90 tablet; Refill: 3 - Vitamin B12 - vitamin B-12 (CYANOCOBALAMIN) 1000 MCG tablet; Take 1 tablet (1,000 mcg total) by mouth daily.  Dispense: 90 tablet; Refill: 3  3. Nicotine abuse  Patient said he is not ready to quit Patient was counseled extensively about smoking cessation, resources provided Patient was extensively counseled about nutrition and exercise   Return in about 6 months (around 04/24/2015), or if symptoms worsen or fail to improve, for Routine Follow Up.  The  patient was given clear instructions to go to ER or return to medical center if symptoms don't improve, worsen or new problems develop. The patient verbalized understanding. The patient was told to call to get lab results if they haven't heard anything in the next week.   This note has been created with Education officer, environmental. Any transcriptional errors are unintentional.    Jeanann Lewandowsky, MD, MHA, FACP, FAAP [facility] [place], [place] [phone]   10/24/2014, 11:01 AM

## 2014-10-24 NOTE — Progress Notes (Signed)
Patient is here to get med refills States he is still smoking and does not want to quit States he does not drink CBG 76 BP147/77 Wants flu and PNA  Colonoscopy up to date

## 2014-10-24 NOTE — Patient Instructions (Signed)
B-Complex Vitamin with Vitamin C formulations What is this medicine? B-COMPLEX VITAMIN with VITAMIN C (B-KOM pleks VAHY tuh min with VAHY tuh min C) is a multivitamin. It is mostly used to help provide good nutrition to people who need extra intake of these vitamins due to stress, renal disease, or other medical conditions. This medicine may be used for other purposes; ask your health care provider or pharmacist if you have questions. COMMON BRAND NAME(S): B-Plex, Cholinoid, DexFol, Diatx, Kerr-McGeeFolbee Plus, Rena-Vite, Strovite, Surbex, [place], Vitaplex What should I tell my health care provider before I take this medicine? They need to know if you have any of these conditions: -any chronic health condition -an unusual or allergic reaction to vitamins, other medicines, foods, dyes, or preservatives -pregnant or trying to get pregnant -breast-feeding How should I use this medicine? Take by mouth with a glass of water. May take with food. Follow the directions on the prescription label. This vitamin is usually given once a day. Do not take your medicine more often than directed. Talk to your pediatrician regarding the use of this medicine in children. Special care may be needed. Overdosage: If you think you have taken too much of this medicine contact a poison control center or emergency room at once. NOTE: This medicine is only for you. Do not share this medicine with others. What if I miss a dose? If you miss a dose, take it as soon as you can. If it is almost time for your next dose, take only that dose. Do not take double or extra doses. What may interact with this medicine? -levodopa This list may not describe all possible interactions. Give your health care provider a list of all the medicines, herbs, non-prescription drugs, or dietary supplements you use. Also tell them if you smoke, drink alcohol, or use illegal drugs. Some items may interact with your medicine. What should I watch for while  using this medicine? See your health care professional for regular checks on your progress. Remember that vitamin supplements do not replace the need for good nutrition from a balanced diet. What side effects may I notice from receiving this medicine? Side effects that you should report to your doctor or health care professional as soon as possible: -allergic reaction such as skin rash or difficulty breathing -vomiting Side effects that usually do not require medical attention (report to your doctor or health care professional if they continue or are bothersome): -nausea -stomach upset This list may not describe all possible side effects. Call your doctor for medical advice about side effects. You may report side effects to FDA at 1-800-FDA-1088. Where should I keep my medicine? Keep out of the reach of children. Most vitamins should be stored at controlled room temperature. Check your specific product directions. Protect from heat and moisture. Throw away any unused medicine after the expiration date. NOTE: This sheet is a summary. It may not cover all possible information. If you have questions about this medicine, talk to your doctor, pharmacist, or health care provider.  2015, Elsevier/Gold Standard. (2007-12-30 14:17:42)

## 2014-10-25 LAB — URINALYSIS, COMPLETE
BACTERIA UA: NONE SEEN
Bilirubin Urine: NEGATIVE
Casts: NONE SEEN
Crystals: NONE SEEN
Glucose, UA: NEGATIVE mg/dL
HGB URINE DIPSTICK: NEGATIVE
KETONES UR: NEGATIVE mg/dL
Leukocytes, UA: NEGATIVE
NITRITE: NEGATIVE
Protein, ur: 30 mg/dL — AB
Specific Gravity, Urine: 1.021 (ref 1.005–1.030)
Squamous Epithelial / LPF: NONE SEEN
UROBILINOGEN UA: 1 mg/dL (ref 0.0–1.0)
pH: 6.5 (ref 5.0–8.0)

## 2014-10-25 LAB — CBC WITH DIFFERENTIAL/PLATELET
Basophils Absolute: 0 10*3/uL (ref 0.0–0.1)
Basophils Relative: 0 % (ref 0–1)
Eosinophils Absolute: 0.1 10*3/uL (ref 0.0–0.7)
Eosinophils Relative: 1 % (ref 0–5)
HCT: 38.2 % — ABNORMAL LOW (ref 39.0–52.0)
HEMOGLOBIN: 12.7 g/dL — AB (ref 13.0–17.0)
LYMPHS ABS: 1.3 10*3/uL (ref 0.7–4.0)
LYMPHS PCT: 23 % (ref 12–46)
MCH: 29.8 pg (ref 26.0–34.0)
MCHC: 33.2 g/dL (ref 30.0–36.0)
MCV: 89.7 fL (ref 78.0–100.0)
MPV: 10.3 fL (ref 8.6–12.4)
Monocytes Absolute: 0.3 10*3/uL (ref 0.1–1.0)
Monocytes Relative: 6 % (ref 3–12)
NEUTROS ABS: 3.9 10*3/uL (ref 1.7–7.7)
Neutrophils Relative %: 70 % (ref 43–77)
Platelets: 295 10*3/uL (ref 150–400)
RBC: 4.26 MIL/uL (ref 4.22–5.81)
RDW: 14.7 % (ref 11.5–15.5)
WBC: 5.6 10*3/uL (ref 4.0–10.5)

## 2014-11-01 ENCOUNTER — Encounter: Payer: Self-pay | Admitting: Internal Medicine

## 2014-11-02 ENCOUNTER — Telehealth: Payer: Self-pay | Admitting: Emergency Medicine

## 2014-11-02 NOTE — Telephone Encounter (Signed)
-----   Message from Quentin Angstlugbemiga E Jegede, MD sent at 11/01/2014  6:36 PM EST ----- Please inform patient that his laboratory test results are mostly within normal limits except for his serum potassium level slightly high. Please schedule patient for repeat potassium level.

## 2014-11-02 NOTE — Telephone Encounter (Signed)
Pt given lab results. States, can I call wife back to schedule repeat lab work

## 2014-12-14 IMAGING — RF DG BE W/ AIR HIGH DENSITY
14 of 24 series · 14 of 24 positions shown · non-contrast
Comparison: none

[Series 1: run · 1 of 1 slices shown (1 of 14)]
[im 1/1]
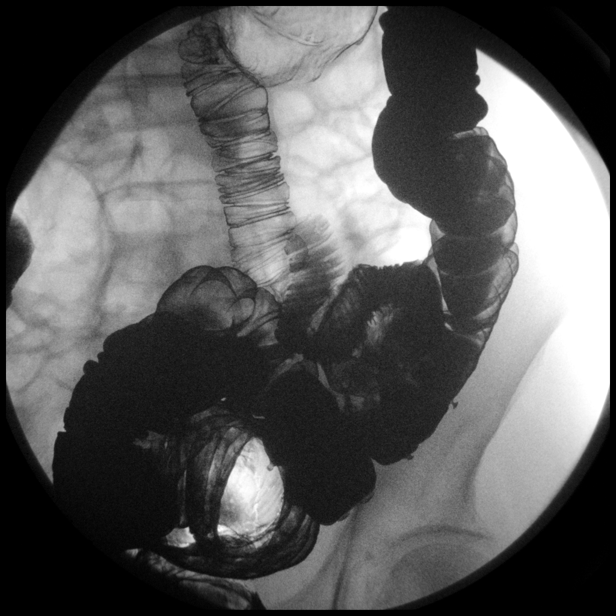

[Series 3: run · 1 of 1 slices shown (2 of 14)]
[im 1/1]
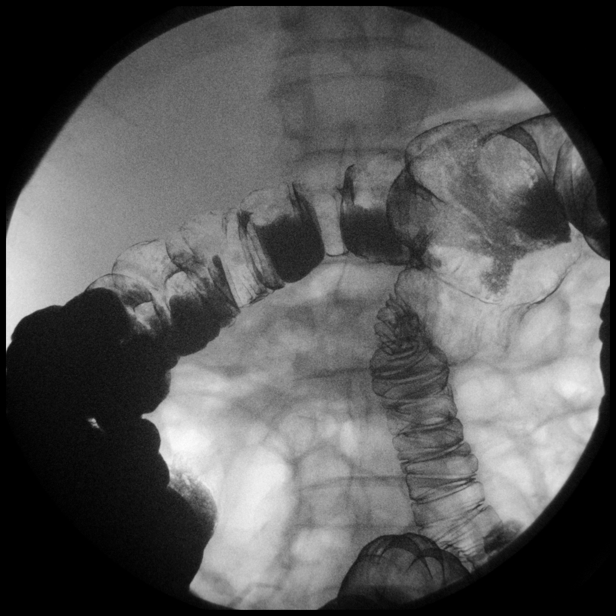

[Series 5: run · 1 of 1 slices shown (3 of 14)]
[im 1/1]
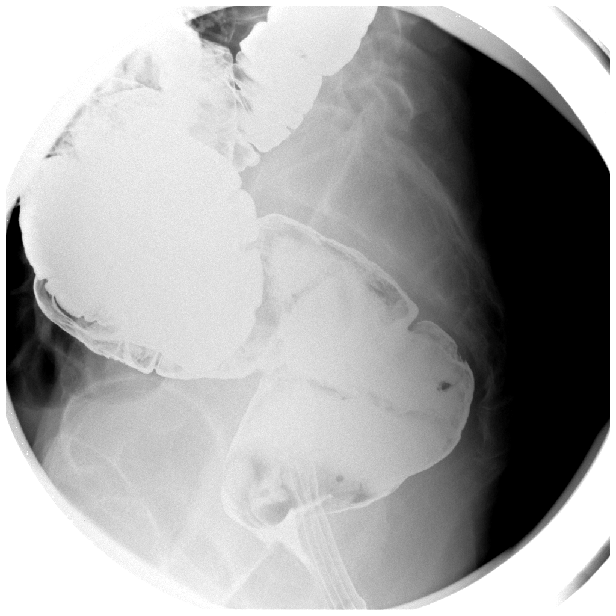

[Series 7: run · 1 of 1 slices shown (4 of 14)]
[im 1/1]
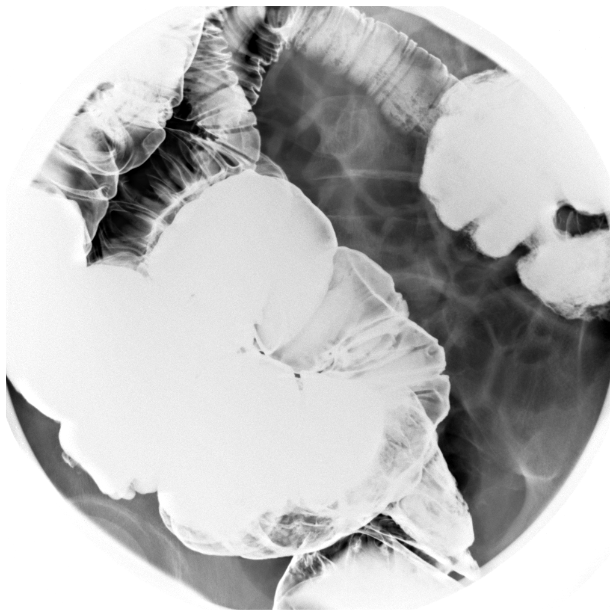

[Series 8: run · 1 of 1 slices shown (5 of 14)]
[im 1/1]
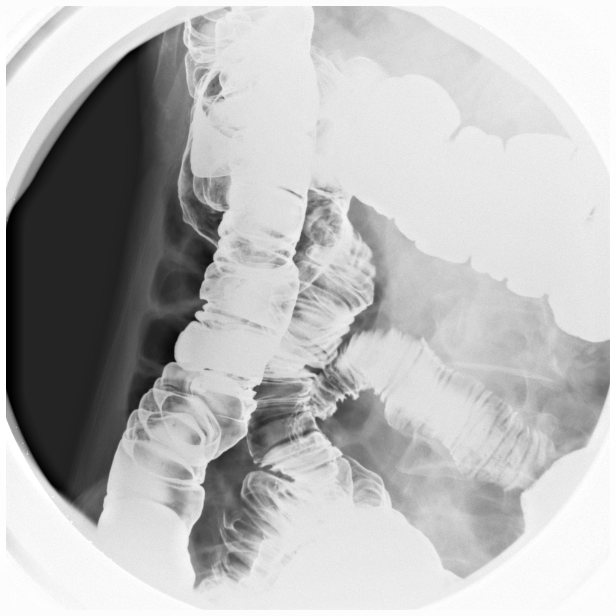

[Series 10: run · 1 of 1 slices shown (6 of 14)]
[im 1/1]
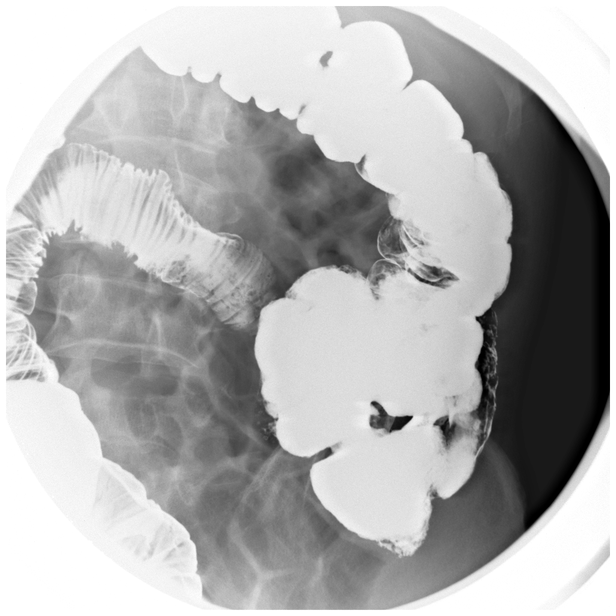

[Series 12: run · 1 of 1 slices shown (7 of 14)]
[im 1/1]
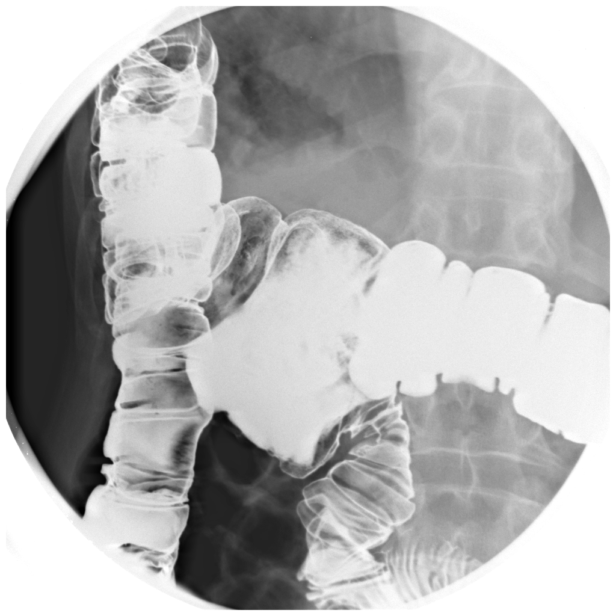

[Series 13: run · 1 of 1 slices shown (8 of 14)]
[im 1/1]
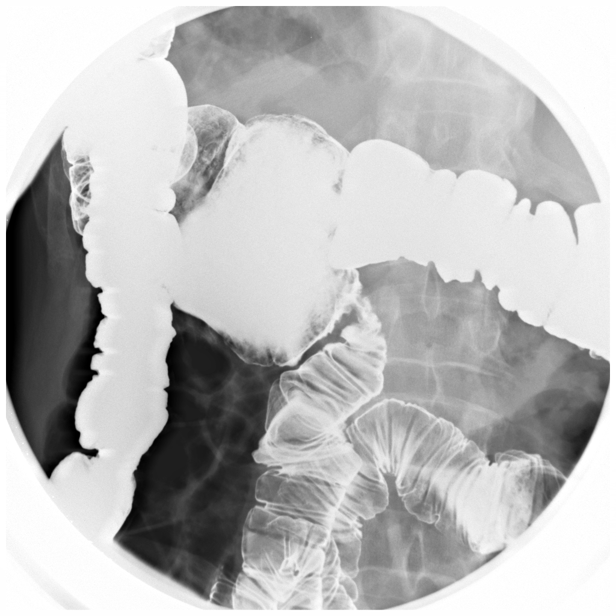

[Series 15: run · 1 of 1 slices shown (9 of 14)]
[im 1/1]
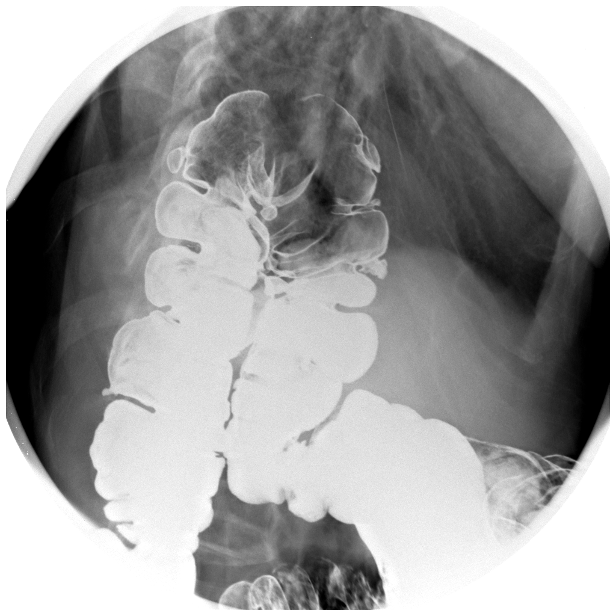

[Series 17: run · 1 of 1 slices shown (10 of 14)]
[im 1/1]
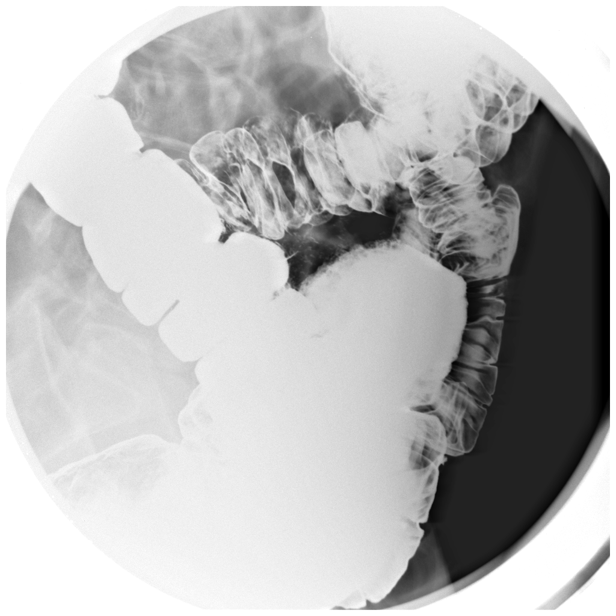

[Series 19: run · 1 of 1 slices shown (11 of 14)]
[im 1/1]
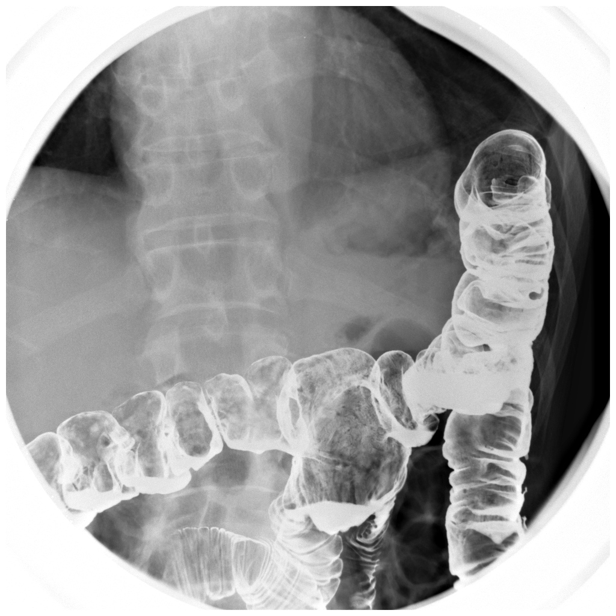

[Series 20: run · 1 of 1 slices shown (12 of 14)]
[im 1/1]
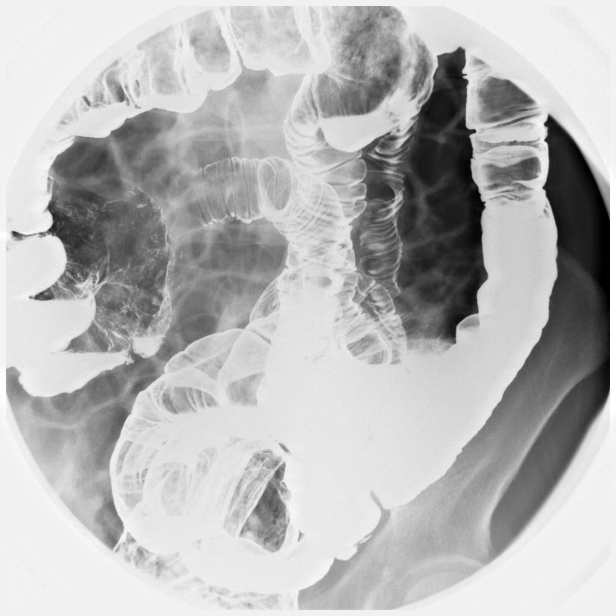

[Series 22: run · 1 of 1 slices shown (13 of 14)]
[im 1/1]
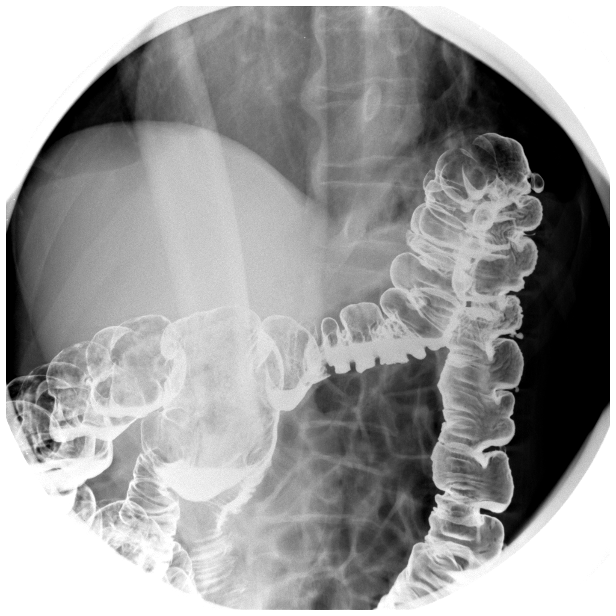

[Series 24: run · 1 of 1 slices shown (14 of 14)]
[im 1/1]
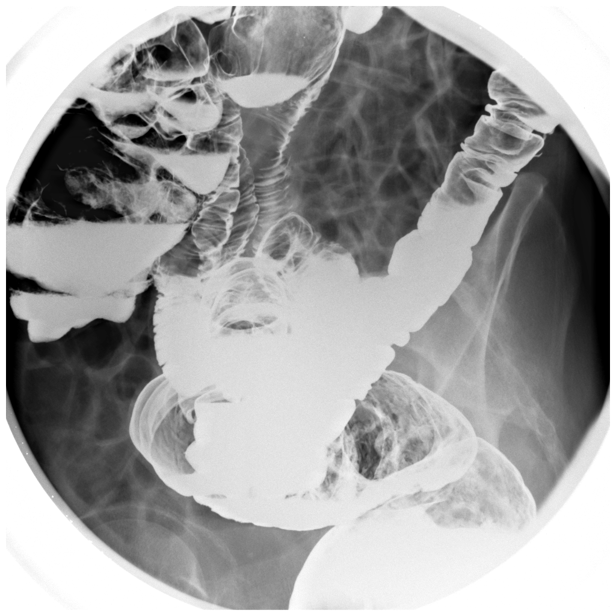

[14 of 24 positions shown; findings below may reference images not displayed]

CLINICAL DATA
Incomplete colonoscopy.

EXAM
AIR CONTRAST BARIUM ENEMA

TECHNIQUE
Initial scout AP supine abdominal image obtained to insure adequate
colon cleansing. Barium was introduced into the colon in a
retrograde fashion and refluxed from the rectum to the distal
transverse colon. As much of the barium as possible was then removed
through the indwelling tube via gravity drain. Air was then
insufflated into the colon. Spot images of the colon followed by
overhead radiographs were obtained.

FLUOROSCOPY TIME
6 min 50 seconds slow pulsed fluoroscopy adjusted for small body
habitus.

COMPARISON
None.

FINDINGS
The patient has redundant sigmoid portion of the colon. There are
scattered diverticula primarily in the splenic flexure and
descending colon.

There is a single segment of the mid transverse colon which is
dilated and there is evidence of 2 anastomoses of the jejunum with
this single segment, best seen on images 23 and 33. The patient has
reportedly had numerous bowel surgeries.

Of barium did pass into the proximal transverse colon into the
ascending colon and cecum the ileocecal valve is identified. The
terminal ileum is not identified.

There are no mass lesions or polyps or strictures.

IMPRESSION
1. Diverticulosis of the splenic flexure and descending colon.
2. Abnormal communication of the jejunum at 2 adjacent sites in the
mid transverse colon. I suspect this could lead to malabsorption.
3. Otherwise normal appearing colon.

SIGNATURE

## 2014-12-22 DIAGNOSIS — Z961 Presence of intraocular lens: Secondary | ICD-10-CM | POA: Diagnosis not present

## 2014-12-22 DIAGNOSIS — H401233 Low-tension glaucoma, bilateral, severe stage: Secondary | ICD-10-CM | POA: Diagnosis not present

## 2014-12-22 DIAGNOSIS — H4011X3 Primary open-angle glaucoma, severe stage: Secondary | ICD-10-CM | POA: Diagnosis not present

## 2015-06-29 DIAGNOSIS — H401233 Low-tension glaucoma, bilateral, severe stage: Secondary | ICD-10-CM | POA: Diagnosis not present

## 2015-06-29 DIAGNOSIS — Z961 Presence of intraocular lens: Secondary | ICD-10-CM | POA: Diagnosis not present

## 2015-10-20 ENCOUNTER — Other Ambulatory Visit: Payer: Self-pay | Admitting: Internal Medicine

## 2015-12-06 ENCOUNTER — Ambulatory Visit: Payer: Medicare Other | Attending: Internal Medicine | Admitting: Internal Medicine

## 2015-12-06 ENCOUNTER — Encounter: Payer: Self-pay | Admitting: Internal Medicine

## 2015-12-06 VITALS — BP 119/74 | HR 80 | Temp 98.0°F | Resp 20 | Wt 127.8 lb

## 2015-12-06 DIAGNOSIS — T819XXA Unspecified complication of procedure, initial encounter: Secondary | ICD-10-CM | POA: Diagnosis not present

## 2015-12-06 DIAGNOSIS — Z79899 Other long term (current) drug therapy: Secondary | ICD-10-CM | POA: Insufficient documentation

## 2015-12-06 DIAGNOSIS — F17210 Nicotine dependence, cigarettes, uncomplicated: Secondary | ICD-10-CM | POA: Diagnosis not present

## 2015-12-06 DIAGNOSIS — Z720 Tobacco use: Secondary | ICD-10-CM | POA: Diagnosis not present

## 2015-12-06 DIAGNOSIS — F1910 Other psychoactive substance abuse, uncomplicated: Secondary | ICD-10-CM

## 2015-12-06 DIAGNOSIS — Z7982 Long term (current) use of aspirin: Secondary | ICD-10-CM | POA: Insufficient documentation

## 2015-12-06 DIAGNOSIS — R1900 Intra-abdominal and pelvic swelling, mass and lump, unspecified site: Secondary | ICD-10-CM | POA: Diagnosis not present

## 2015-12-06 DIAGNOSIS — K219 Gastro-esophageal reflux disease without esophagitis: Secondary | ICD-10-CM | POA: Insufficient documentation

## 2015-12-06 DIAGNOSIS — Z23 Encounter for immunization: Secondary | ICD-10-CM

## 2015-12-06 NOTE — Progress Notes (Signed)
Brandon Taylor, is a 75 y.o. male  [id number]  [id number]  DOB - [birth year]  CC:  Chief Complaint  Patient presents with  . Mass    Abdomen       HPI: Brandon Taylor is a 75 y.o. male here today for abdominal bump. Patient has a history significant for alcoholism, tobacco abuse, Vitamin B12 deficiency, abdominal trauma from work accident (resulting in multiple GI surgeries in the 1970's). Patient presents with complaint of "small bump on his abdomen" which is aggravated by the belts that he wears. Patient states no pain is associated with the bump "which is half the size it was last week when he made appointment". The abdominal pump is located along patients abdominal surgery scar. Patient has No headache, No chest pain, No abdominal pain - No Nausea, no vomiting, no abdominal distension, no constipation, No new weakness tingling or numbness, No Cough - SOB.  No Known Allergies Past Medical History  Diagnosis Date  . GERD (gastroesophageal reflux disease)   . ETOH abuse    Current Outpatient Prescriptions on File Prior to Visit  Medication Sig Dispense Refill  . aspirin ([facility] ASPIRIN LOW DOSE) 81 MG EC tablet Take 1 tablet (81 mg total) by mouth daily. Needs office visit 90 tablet 0  . folic acid (FOLVITE) 1 MG tablet Take 1 tablet (1 mg total) by mouth daily. Needs office visit for refills 90 tablet 0  . pantoprazole (PROTONIX) 40 MG tablet Take 1 tablet (40 mg total) by mouth daily before breakfast. 90 tablet 3  . thiamine 100 MG tablet Take 1 tablet (100 mg total) by mouth daily. 90 tablet 3  . vitamin B-12 (CYANOCOBALAMIN) 1000 MCG tablet Take 1 tablet (1,000 mcg total) by mouth daily. 90 tablet 3   No current facility-administered medications on file prior to visit.   Family History  Problem Relation Age of Onset  . COPD Brother    Social History   Social History  . Marital Status: Married    Spouse Name: N/A  . Number of Children: N/A  . Years of Education: N/A    Occupational History  . Not on file.   Social History Main Topics  . Smoking status: Current Every Day Smoker -- 2.00 packs/day for 40 years  . Smokeless tobacco: Not on file  . Alcohol Use: No     Comment: fifth daily  . Drug Use: No  . Sexual Activity: Not on file   Other Topics Concern  . Not on file   Social History Narrative    Review of Systems: Constitutional: Negative for fever, chills, diaphoresis, activity change, appetite change and fatigue. HENT: Negative for ear pain, nosebleeds, congestion, facial swelling, rhinorrhea, neck pain, neck stiffness and ear discharge.  Eyes: Negative for pain, discharge, redness, itching and visual disturbance. Respiratory: Negative for cough, choking, chest tightness, shortness of breath, wheezing and stridor.  Cardiovascular: Negative for chest pain, palpitations and leg swelling. Gastrointestinal: Negative for abdominal distention. Genitourinary: Negative for dysuria, urgency, frequency, hematuria, flank pain, decreased urine volume, difficulty urinating and dyspareunia.  Musculoskeletal: Negative for back pain, joint swelling, arthralgia and gait problem. Neurological: Negative for dizziness, tremors, seizures, syncope, facial asymmetry, speech difficulty, weakness, light-headedness, numbness and headaches.  Hematological: Negative for adenopathy. Does not bruise/bleed easily. Psychiatric/Behavioral: Negative for hallucinations, behavioral problems, confusion, dysphoric mood, decreased concentration and agitation.    Objective:   Filed Vitals:   12/06/15 1137  BP: 119/74  Pulse: 80  Temp: 98 F (36.7  C)  Resp: 20    Physical Exam: Constitutional: Patient appears well-developed and well-nourished. No distress. HENT: Normocephalic, atraumatic, External right and left ear normal. Oropharynx is clear and moist.  Eyes: Conjunctivae and EOM are normal. PERRLA, no scleral icterus. Neck: Normal ROM. Neck supple. No JVD. No  tracheal deviation. No thyromegaly. CVS: RRR, S1/S2 +, no murmurs, no gallops, no carotid bruit.  Pulmonary: Effort and breath sounds normal, no stridor, rhonchi, wheezes, rales.  Abdominal: Soft. BS +, no distension, tenderness, rebound or guarding.  Musculoskeletal: Normal range of motion. No edema and no tenderness.  Lymphadenopathy: No lymphadenopathy noted. Neuro: Alert & Oriented x 4. Skin: Skin is warm and dry. No rash noted. Not diaphoretic. No erythema. No pallor. Bump on abdomen, along surgical scar. 0.5 cm x 0.5 cm. Psychiatric: Normal mood and affect. Behavior, judgment, thought content normal.  Lab Results  Component Value Date   WBC 5.6 10/24/2014   HGB 12.7* 10/24/2014   HCT 38.2* 10/24/2014   MCV 89.7 10/24/2014   PLT 295 10/24/2014   Lab Results  Component Value Date   CREATININE 1.19 10/24/2014   BUN 16 10/24/2014   NA 143 10/24/2014   K 5.8* 10/24/2014   CL 106 10/24/2014   CO2 29 10/24/2014    Lab Results  Component Value Date   HGBA1C 5.90 10/24/2014   Lipid Panel     Component Value Date/Time   CHOL 180 01/02/2014 0947   TRIG 70 01/02/2014 0947   HDL 71 01/02/2014 0947   CHOLHDL 2.5 01/02/2014 0947   VLDL 14 01/02/2014 0947   LDLCALC 95 01/02/2014 0947       Assessment and plan:   Brandon Taylor was seen today for mass.  Diagnoses and all orders for this visit:  Surgical site reaction, initial encounter -     Pneumococcal conjugate vaccine 13-valent -     Ambulatory referral to General Surgery  Nicotine abuse   Brandon Taylor was counseled on the dangers of tobacco use, and was advised to quit. Reviewed strategies to maximize success, including removing cigarettes and smoking materials from environment, stress management and support of family/friends.  Return in about 6 months (around 06/04/2016) for Follow up Pain and comorbidities.  The patient was given clear instructions to go to ER or return to medical center if symptoms don't improve, worsen or  new problems develop. The patient verbalized understanding. The patient was told to call to get lab results if they haven't heard anything in the next week.     Stephanie Coup, AGNP-[facility] [phone] 12/06/2015, 2:19 PM  Evaluation and management procedures were performed by the Advanced Practitioner under my supervision and collaboration. I have reviewed the Advanced Practitioner's note and chart, and I agree with the management and plan.   Jeanann Lewandowsky, MD, MHA, CPE, FACP, FAAP [facility] [place], [place] [phone]   12/06/2015, 6:33 PM

## 2015-12-06 NOTE — Progress Notes (Signed)
Patient is here for Bump on Abdomen  Patient denies pain at this time.  Patient would like the pneumococcal 13 Patient tolerated injection well.

## 2015-12-06 NOTE — Patient Instructions (Signed)
Smoking Cessation, Tips for Success If you are ready to quit smoking, congratulations! You have chosen to help yourself be healthier. Cigarettes bring nicotine, tar, carbon monoxide, and other irritants into your body. Your lungs, heart, and blood vessels will be able to work better without these poisons. There are many different ways to quit smoking. Nicotine gum, nicotine patches, a nicotine inhaler, or nicotine nasal spray can help with physical craving. Hypnosis, support groups, and medicines help break the habit of smoking. WHAT THINGS CAN I DO TO MAKE QUITTING EASIER?  Here are some tips to help you quit for good:  Pick a date when you will quit smoking completely. Tell all of your friends and family about your plan to quit on that date.  Do not try to slowly cut down on the number of cigarettes you are smoking. Pick a quit date and quit smoking completely starting on that day.  Throw away all cigarettes.   Clean and remove all ashtrays from your home, work, and car.  On a card, write down your reasons for quitting. Carry the card with you and read it when you get the urge to smoke.  Cleanse your body of nicotine. Drink enough water and fluids to keep your urine clear or pale yellow. Do this after quitting to flush the nicotine from your body.  Learn to predict your moods. Do not let a bad situation be your excuse to have a cigarette. Some situations in your life might tempt you into wanting a cigarette.  Never have "just one" cigarette. It leads to wanting another and another. Remind yourself of your decision to quit.  Change habits associated with smoking. If you smoked while driving or when feeling stressed, try other activities to replace smoking. Stand up when drinking your coffee. Brush your teeth after eating. Sit in a different chair when you read the paper. Avoid alcohol while trying to quit, and try to drink fewer caffeinated beverages. Alcohol and caffeine may urge you to  smoke.  Avoid foods and drinks that can trigger a desire to smoke, such as sugary or spicy foods and alcohol.  Ask people who smoke not to smoke around you.  Have something planned to do right after eating or having a cup of coffee. For example, plan to take a walk or exercise.  Try a relaxation exercise to calm you down and decrease your stress. Remember, you may be tense and nervous for the first 2 weeks after you quit, but this will pass.  Find new activities to keep your hands busy. Play with a pen, coin, or rubber band. Doodle or draw things on paper.  Brush your teeth right after eating. This will help cut down on the craving for the taste of tobacco after meals. You can also try mouthwash.   Use oral substitutes in place of cigarettes. Try using lemon drops, carrots, cinnamon sticks, or chewing gum. Keep them handy so they are available when you have the urge to smoke.  When you have the urge to smoke, try deep breathing.  Designate your home as a nonsmoking area.  If you are a heavy smoker, ask your health care provider about a prescription for nicotine chewing gum. It can ease your withdrawal from nicotine.  Reward yourself. Set aside the cigarette money you save and buy yourself something nice.  Look for support from others. Join a support group or smoking cessation program. Ask someone at home or at work to help you with your plan   to quit smoking.  Always ask yourself, "Do I need this cigarette or is this just a reflex?" Tell yourself, "Today, I choose not to smoke," or "I do not want to smoke." You are reminding yourself of your decision to quit.  Do not replace cigarette smoking with electronic cigarettes (commonly called e-cigarettes). The safety of e-cigarettes is unknown, and some may contain harmful chemicals.  If you relapse, do not give up! Plan ahead and think about what you will do the next time you get the urge to smoke. HOW WILL I FEEL WHEN I QUIT SMOKING? You  may have symptoms of withdrawal because your body is used to nicotine (the addictive substance in cigarettes). You may crave cigarettes, be irritable, feel very hungry, cough often, get headaches, or have difficulty concentrating. The withdrawal symptoms are only temporary. They are strongest when you first quit but will go away within 10-14 days. When withdrawal symptoms occur, stay in control. Think about your reasons for quitting. Remind yourself that these are signs that your body is healing and getting used to being without cigarettes. Remember that withdrawal symptoms are easier to treat than the major diseases that smoking can cause.  Even after the withdrawal is over, expect periodic urges to smoke. However, these cravings are generally short lived and will go away whether you smoke or not. Do not smoke! WHAT RESOURCES ARE AVAILABLE TO HELP ME QUIT SMOKING? Your health care provider can direct you to community resources or hospitals for support, which may include:  Group support.  Education.  Hypnosis.  Therapy.   This information is not intended to replace advice given to you by your health care provider. Make sure you discuss any questions you have with your health care provider.   Document Released: 06/27/2004 Document Revised: 10/20/2014 Document Reviewed: 03/17/2013 Elsevier Interactive Patient Education 2016 Elsevier Inc.  

## 2015-12-27 DIAGNOSIS — Z961 Presence of intraocular lens: Secondary | ICD-10-CM | POA: Diagnosis not present

## 2015-12-27 DIAGNOSIS — H401232 Low-tension glaucoma, bilateral, moderate stage: Secondary | ICD-10-CM | POA: Diagnosis not present

## 2016-01-10 ENCOUNTER — Telehealth: Payer: Self-pay | Admitting: Internal Medicine

## 2016-01-10 NOTE — Telephone Encounter (Signed)
MA returned phone call to [place]. The automated system stated the office was closed. MA will try again tomorrow.

## 2016-01-10 NOTE — Telephone Encounter (Signed)
Brandon Taylor [facility] called in regards to surgery She stated that They are unable to perform surgery. Patient needs to go back to previous surgeon     [phone]

## 2016-01-20 ENCOUNTER — Other Ambulatory Visit: Payer: Self-pay | Admitting: Internal Medicine

## 2016-01-27 ENCOUNTER — Other Ambulatory Visit: Payer: Self-pay | Admitting: Internal Medicine

## 2016-02-12 DIAGNOSIS — T814XXA Infection following a procedure, initial encounter: Secondary | ICD-10-CM | POA: Diagnosis not present

## 2016-04-29 ENCOUNTER — Other Ambulatory Visit: Payer: Self-pay | Admitting: Internal Medicine

## 2016-07-03 DIAGNOSIS — Z961 Presence of intraocular lens: Secondary | ICD-10-CM | POA: Diagnosis not present

## 2016-07-03 DIAGNOSIS — H401232 Low-tension glaucoma, bilateral, moderate stage: Secondary | ICD-10-CM | POA: Diagnosis not present

## 2016-07-08 DIAGNOSIS — S30855A Superficial foreign body of unspecified external genital organs, male, initial encounter: Secondary | ICD-10-CM | POA: Diagnosis not present

## 2016-07-08 DIAGNOSIS — H469 Unspecified optic neuritis: Secondary | ICD-10-CM | POA: Diagnosis not present

## 2016-08-31 ENCOUNTER — Other Ambulatory Visit: Payer: Self-pay | Admitting: Internal Medicine

## 2016-09-02 ENCOUNTER — Other Ambulatory Visit: Payer: Self-pay | Admitting: Internal Medicine

## 2016-10-20 ENCOUNTER — Other Ambulatory Visit: Payer: Self-pay | Admitting: Internal Medicine

## 2016-10-21 ENCOUNTER — Other Ambulatory Visit: Payer: Self-pay | Admitting: *Deleted

## 2016-10-21 MED ORDER — PANTOPRAZOLE SODIUM 40 MG PO TBEC: 40.0000 mg | DELAYED_RELEASE_TABLET | Freq: Every day | ORAL | 0 refills | Status: DC

## 2016-12-13 ENCOUNTER — Other Ambulatory Visit: Payer: Self-pay | Admitting: Internal Medicine

## 2016-12-22 ENCOUNTER — Other Ambulatory Visit: Payer: Self-pay | Admitting: Internal Medicine

## 2017-01-01 DIAGNOSIS — Z961 Presence of intraocular lens: Secondary | ICD-10-CM | POA: Diagnosis not present

## 2017-01-01 DIAGNOSIS — H401232 Low-tension glaucoma, bilateral, moderate stage: Secondary | ICD-10-CM | POA: Diagnosis not present

## 2017-02-17 ENCOUNTER — Observation Stay (HOSPITAL_COMMUNITY): Payer: Medicare Other

## 2017-02-17 ENCOUNTER — Emergency Department (HOSPITAL_COMMUNITY): Payer: Medicare Other

## 2017-02-17 ENCOUNTER — Observation Stay (HOSPITAL_COMMUNITY)
Admission: EM | Admit: 2017-02-17 | Discharge: 2017-02-18 | Disposition: A | Discharge status: Home / Self Care | Payer: Medicare Other | Attending: Internal Medicine | Admitting: Internal Medicine

## 2017-02-17 ENCOUNTER — Encounter (HOSPITAL_COMMUNITY): Payer: Self-pay

## 2017-02-17 DIAGNOSIS — R0602 Shortness of breath: Secondary | ICD-10-CM | POA: Diagnosis not present

## 2017-02-17 DIAGNOSIS — F17200 Nicotine dependence, unspecified, uncomplicated: Secondary | ICD-10-CM | POA: Insufficient documentation

## 2017-02-17 DIAGNOSIS — N281 Cyst of kidney, acquired: Secondary | ICD-10-CM | POA: Diagnosis not present

## 2017-02-17 DIAGNOSIS — Z79899 Other long term (current) drug therapy: Secondary | ICD-10-CM | POA: Diagnosis not present

## 2017-02-17 DIAGNOSIS — R531 Weakness: Secondary | ICD-10-CM | POA: Diagnosis present

## 2017-02-17 DIAGNOSIS — Z7982 Long term (current) use of aspirin: Secondary | ICD-10-CM | POA: Diagnosis not present

## 2017-02-17 DIAGNOSIS — N179 Acute kidney failure, unspecified: Principal | ICD-10-CM | POA: Diagnosis present

## 2017-02-17 LAB — URINALYSIS, ROUTINE W REFLEX MICROSCOPIC
BILIRUBIN URINE: NEGATIVE
GLUCOSE, UA: NEGATIVE mg/dL
KETONES UR: 5 mg/dL — AB
LEUKOCYTES UA: NEGATIVE
NITRITE: NEGATIVE
PH: 5 (ref 5.0–8.0)
PROTEIN: 30 mg/dL — AB
SPECIFIC GRAVITY, URINE: 1.017 (ref 1.005–1.030)

## 2017-02-17 LAB — CBC
HCT: 42.8 % (ref 39.0–52.0)
Hemoglobin: 14 g/dL (ref 13.0–17.0)
MCH: 28.9 pg (ref 26.0–34.0)
MCHC: 32.7 g/dL (ref 30.0–36.0)
MCV: 88.4 fL (ref 78.0–100.0)
PLATELETS: 205 10*3/uL (ref 150–400)
RBC: 4.84 MIL/uL (ref 4.22–5.81)
RDW: 14.7 % (ref 11.5–15.5)
WBC: 7.7 10*3/uL (ref 4.0–10.5)

## 2017-02-17 LAB — TSH: TSH: 0.417 u[IU]/mL (ref 0.350–4.500)

## 2017-02-17 LAB — T4, FREE: Free T4: 1.42 ng/dL — ABNORMAL HIGH (ref 0.61–1.12)

## 2017-02-17 LAB — BASIC METABOLIC PANEL
Anion gap: 18 — ABNORMAL HIGH (ref 5–15)
BUN: 114 mg/dL — AB (ref 6–20)
CALCIUM: 9.1 mg/dL (ref 8.9–10.3)
CO2: 22 mmol/L (ref 22–32)
CREATININE: 2.81 mg/dL — AB (ref 0.61–1.24)
Chloride: 102 mmol/L (ref 101–111)
GFR calc Af Amer: 23 mL/min — ABNORMAL LOW (ref >60)
GFR, EST NON AFRICAN AMERICAN: 20 mL/min — AB (ref >60)
Glucose, Bld: 157 mg/dL — ABNORMAL HIGH (ref 65–99)
Potassium: 4.5 mmol/L (ref 3.5–5.1)
SODIUM: 142 mmol/L (ref 135–145)

## 2017-02-17 LAB — HEPATIC FUNCTION PANEL
ALT: 17 U/L (ref 17–63)
AST: 39 U/L (ref 15–41)
Albumin: 3.3 g/dL — ABNORMAL LOW (ref 3.5–5.0)
Alkaline Phosphatase: 69 U/L (ref 38–126)
BILIRUBIN DIRECT: 0.2 mg/dL (ref 0.1–0.5)
BILIRUBIN TOTAL: 1 mg/dL (ref 0.3–1.2)
Indirect Bilirubin: 0.8 mg/dL (ref 0.3–0.9)
Total Protein: 7.8 g/dL (ref 6.5–8.1)

## 2017-02-17 LAB — VITAMIN B12: Vitamin B-12: 425 pg/mL (ref 180–914)

## 2017-02-17 LAB — I-STAT TROPONIN, ED: TROPONIN I, POC: 0.02 ng/mL (ref 0.00–0.08)

## 2017-02-17 LAB — FERRITIN: FERRITIN: 932 ng/mL — AB (ref 24–336)

## 2017-02-17 LAB — I-STAT CG4 LACTIC ACID, ED: LACTIC ACID, VENOUS: 1.7 mmol/L (ref 0.5–1.9)

## 2017-02-17 LAB — TROPONIN I: TROPONIN I: 0.05 ng/mL — AB (ref <0.03)

## 2017-02-17 MED ORDER — SODIUM CHLORIDE 0.9% FLUSH: 3.0000 mL | Freq: Two times a day (BID) | INTRAVENOUS | Status: DC

## 2017-02-17 MED ORDER — SODIUM CHLORIDE 0.9 % IV BOLUS (SEPSIS)
1000.0000 mL | Freq: Once | INTRAVENOUS | Status: AC
  Administered 2017-02-17: 1000 mL via INTRAVENOUS

## 2017-02-17 MED ORDER — ENSURE ENLIVE PO LIQD
237.0000 mL | Freq: Two times a day (BID) | ORAL | Status: DC
  Administered 2017-02-18: 237 mL via ORAL

## 2017-02-17 MED ORDER — FOLIC ACID 1 MG PO TABS
1.0000 mg | ORAL_TABLET | Freq: Every day | ORAL | Status: DC
  Administered 2017-02-17 – 2017-02-18 (×2): 1 mg via ORAL
  Filled 2017-02-17 (×2): qty 1

## 2017-02-17 MED ORDER — VITAMIN B-1 100 MG PO TABS
100.0000 mg | ORAL_TABLET | Freq: Every day | ORAL | Status: DC
  Administered 2017-02-17 – 2017-02-18 (×2): 100 mg via ORAL
  Filled 2017-02-17 (×2): qty 1

## 2017-02-17 MED ORDER — SENNOSIDES-DOCUSATE SODIUM 8.6-50 MG PO TABS: 1.0000 | ORAL_TABLET | Freq: Every evening | ORAL | Status: DC | PRN

## 2017-02-17 MED ORDER — ENOXAPARIN SODIUM 30 MG/0.3ML ~~LOC~~ SOLN
30.0000 mg | SUBCUTANEOUS | Status: DC
  Administered 2017-02-17: 30 mg via SUBCUTANEOUS
  Filled 2017-02-17: qty 0.3

## 2017-02-17 MED ORDER — PANTOPRAZOLE SODIUM 40 MG PO TBEC
40.0000 mg | DELAYED_RELEASE_TABLET | Freq: Every day | ORAL | Status: DC
  Administered 2017-02-17 – 2017-02-18 (×2): 40 mg via ORAL
  Filled 2017-02-17 (×3): qty 1

## 2017-02-17 MED ORDER — ADULT MULTIVITAMIN W/MINERALS CH
1.0000 | ORAL_TABLET | Freq: Every day | ORAL | Status: DC
  Administered 2017-02-17 – 2017-02-18 (×2): 1 via ORAL
  Filled 2017-02-17 (×2): qty 1

## 2017-02-17 MED ORDER — ASPIRIN EC 81 MG PO TBEC
81.0000 mg | DELAYED_RELEASE_TABLET | Freq: Every day | ORAL | Status: DC
  Administered 2017-02-17 – 2017-02-18 (×2): 81 mg via ORAL
  Filled 2017-02-17 (×2): qty 1

## 2017-02-17 MED ORDER — ACETAMINOPHEN 650 MG RE SUPP: 650.0000 mg | Freq: Four times a day (QID) | RECTAL | Status: DC | PRN

## 2017-02-17 MED ORDER — SODIUM CHLORIDE 0.9 % IV SOLN
INTRAVENOUS | Status: DC
  Administered 2017-02-17: 19:00:00 via INTRAVENOUS

## 2017-02-17 MED ORDER — ACETAMINOPHEN 500 MG PO TABS: 1000.0000 mg | ORAL_TABLET | Freq: Four times a day (QID) | ORAL | Status: DC | PRN

## 2017-02-17 NOTE — ED Triage Notes (Signed)
Pt brought in by his wife with complaint of weakness X4 days. He is alert and oriented but poor historian giving very little information. Wife reports patient has not been eating only drinking. Skin warm and dry. Resp e/u.

## 2017-02-17 NOTE — ED Notes (Signed)
Patient transported to Ultrasound 

## 2017-02-17 NOTE — H&P (Signed)
Date: 02/17/2017               Patient Name:  Brandon Taylor MRN: [id number]  DOB: 1941/01/01 Age / Sex: 76 y.o., male   PCP: System, Pcp Not In         Medical Service: Internal Medicine Teaching Service         Attending Physician: Dr. Rogelia Boga, Austin Miles, MD    First Contact: Dr. Nelson Chimes Pager: [phone]  Second Contact: Dr. Loney Loh Pager: [phone]       After Hours (After 5p/  First Contact Pager: [phone]  weekends / holidays): Second Contact Pager: [phone]   Chief Complaint: Generalized weakness.  History of Present Illness: ERSEL ENSLIN is a 76 y.o. man with PMHx off GERD, previous alcohol abuse, history of mild intermittent dysphagia came to ED with complaint of generalized malaise, weakness, decreased appetite for last 3-4 days.  He do endorse mild nausea with the smell of food but denies any vomiting. He was also complaining of worsening cough with whitish sputum since this morning. He denies any nasal congestion, or other upper respiratory symptoms except his chronic cough which were worse today, fever or chills, exertional dyspnea, chest pain, orthopnea or PND. He also endorse some intermittent solid food dysphagia, which is present for many years and according to wife has been investigated in the past with no suspicious findings. He also denies any abdominal pain or recent change in his bowel habits. He denies any unintentional weight loss. He do endorse increased frequency of urine, inability to completely evacuate his bladder and some dribbling for the last couple of weeks but denies any nocturia.  In ED his vitals were stable with microscopic hemoglobinuria, few  Ketones, creatinine of 2.81, BUN 114, he was being admitted for his generalized fatigue and AK I.  Meds:  Current Meds  Medication Sig  . aspirin 81 MG EC tablet Take 1 tablet (81 mg total) by mouth daily.  . [facility]S B-1 100 MG tablet TAKE 1 TABLET BY MOUTH EVERY DAY  . folic acid (FOLVITE) 1 MG tablet TAKE 1  TABLET BY MOUTH EVERY DAY  . pantoprazole (PROTONIX) 40 MG tablet Take 1 tablet (40 mg total) by mouth daily.  . vitamin B-12 (CYANOCOBALAMIN) 1000 MCG tablet Take 1 tablet (1,000 mcg total) by mouth daily.     Allergies: Allergies as of 02/17/2017  . (No Known Allergies)   Past Medical History:  Diagnosis Date  . ETOH abuse   . GERD (gastroesophageal reflux disease)     Family History: Noncontributory  Social History: Lifelong smoker smokes 1 pack per day, according to patient quit 2 weeks ago. He has history of alcohol abuse but according to patient he quit 3-4 years ago. Denies any illicit drug use.  Review of Systems: A complete ROS was negative except as per HPI.   Physical Exam: Blood pressure 140/79, pulse 75, temperature 97 F (36.1 C), temperature source Axillary, resp. rate 19, SpO2 95 %. Vitals:   02/17/17 1615 02/17/17 1645 02/17/17 1700 02/17/17 1804  BP: (!) 155/80 (!) 152/87 140/73 133/60  Pulse: 71 78 81 92  Resp: 16 13 11 20   Temp:      TempSrc:      SpO2: 92% 97% 94% 94%  Weight:    130 lb (59 kg)  Height:    6' (1.829 m)   General: Vital signs reviewed. Little emaciated man,in no acute distress and cooperative with exam.  Head: Normocephalic  and atraumatic. Eyes: EOMI, conjunctivae normal, no scleral icterus.  Neck: Supple, trachea midline, normal ROM, no JVD, masses, thyromegaly, or carotid bruit present.  Cardiovascular: RRR, S1 normal, S2 normal, no murmurs, gallops, or rubs. Pulmonary/Chest: Clear to auscultation bilaterally, no wheezes, rales, or rhonchi. Abdominal: Soft, non-tender, non-distended, BS +, no masses, organomegaly, or guarding present.  Extremities: No lower extremity edema bilaterally,  pulses symmetric and intact bilaterally. No cyanosis or clubbing. Neurological: A&O x3, Strength is normal and symmetric bilaterally, cranial nerve II-XII are grossly intact, no focal motor deficit, sensory intact to light touch bilaterally.  Skin:  Warm, dry and intact. No rashes or erythema. Psychiatric: Normal mood and affect. speech and behavior is normal. Cognition and memory are normal.  Labs. CBC Latest Ref Rng & Units 02/17/2017 10/24/2014 01/02/2014  WBC 4.0 - 10.5 K/uL 7.7 5.6 5.0  Hemoglobin 13.0 - 17.0 g/dL 16.114.0 12.7(L) 11.5(L)  Hematocrit 39.0 - 52.0 % 42.8 38.2(L) 33.6(L)  Platelets 150 - 400 K/uL 205 295 343   CMP Latest Ref Rng & Units 02/17/2017 10/24/2014 01/02/2014  Glucose 65 - 99 mg/dL 096(E157(H) 76 78  BUN 6 - 20 mg/dL 454(U114(H) 16 16  Creatinine 0.61 - 1.24 mg/dL 9.81(X2.81(H) 9.141.19 7.821.16  Sodium 135 - 145 mmol/L 142 143 141  Potassium 3.5 - 5.1 mmol/L 4.5 5.8(H) 5.2  Chloride 101 - 111 mmol/L 102 106 104  CO2 22 - 32 mmol/L 22 29 28   Calcium 8.9 - 10.3 mg/dL 9.1 9.4 9.2  Total Protein 6.5 - 8.1 g/dL 7.8 7.0 6.7  Total Bilirubin 0.3 - 1.2 mg/dL 1.0 0.4 0.3  Alkaline Phos 38 - 126 U/L 69 93 82  AST 15 - 41 U/L 39 18 16  ALT 17 - 63 U/L 17 8 <8   Urinalysis    Component Value Date/Time   COLORURINE YELLOW 02/17/2017 1440   APPEARANCEUR HAZY (A) 02/17/2017 1440   LABSPEC 1.017 02/17/2017 1440   PHURINE 5.0 02/17/2017 1440   GLUCOSEU NEGATIVE 02/17/2017 1440   HGBUR MODERATE (A) 02/17/2017 1440   BILIRUBINUR NEGATIVE 02/17/2017 1440   KETONESUR 5 (A) 02/17/2017 1440   PROTEINUR 30 (A) 02/17/2017 1440   UROBILINOGEN 1 10/24/2014 1114   NITRITE NEGATIVE 02/17/2017 1440   LEUKOCYTESUR NEGATIVE 02/17/2017 1440   Troponin. 0.02 Lactic acid. 1.70 TSH. 0.417  EKG: Sinus rhythm with peaked T waves and baseline wander.  CXR: FINDINGS: Two left-side-down decubitus views of the chest are submitted. Blunting of the left costophrenic angle and scarring at the left lung base are stable. There is no significant layering pleural fluid. The right lung is clear. The heart size and mediastinal contours are stable.  IMPRESSION: No significant layering pleural fluid on the left.  US Renal. FINDINGS: Right Kidney:  Length: 8.8  cm. Mild thinning of the renal parenchyma and increased renal echogenicity. Cyst in the mid kidney measures 1.3 cm. No solid mass or hydronephrosis visualized.  Left Kidney:  Length: 9.7 cm. Mild increased renal echogenicity. Cyst in the mid kidney measures 1 cm. No solid mass or hydronephrosis visualized.  Bladder:  Appears normal for degree of bladder distention.  Incidental note of prominent prostate gland measuring 4.8 x 4.6 x 2.4 cm.  IMPRESSION: Mild increased renal echogenicity consistent with chronic medical renal disease. No obstructive uropathy.  Small bilateral renal cysts.  Assessment & Plan by Problem: Stanford BreedHarry L Carstens is a 76 y.o. man with PMHx off GERD, previous alcohol abuse, history of mild intermittent dysphagia came to ED with  complaint of generalized malaise, weakness, decreased appetite for last 3-4 days.  Generalized malaise. His vitals and initial labs were stable, he does not show any signs of infection including being afebrile, no leukocytosis. He does not exhibit any exacerbation of GERD symptoms or abdominal pain, except mild nausea with smell of food. His hepatic functions are within normal limit. 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 His last recorded weight was in January 2016 during [facility] which was 133 pounds, today it was 1:30 pounds not any significant difference over 2 years. He has an history of vitamin B12 deficiency anemia, currently CBC within normal limit. Patient is on B12 supplement. He had normal EGD and colonoscopy done in 2015. Because of his chronic alcohol abuse history and smoking-malignancy can be a possibility. If he does not show any improvement with the next few days-needs a workup which can be  done as an outpatient. -TSH level is within low normal limit. -Check free T4. -Vitamin B12 level. -Vitamin D level. -Nutritional consult  AKI (Acute Kidney Injury): He was found to have elevated BUN/creatinine of 114/2.81. It was normal 2 years ago. His UA was positive for microscopic hematuria and ketones with some mucus and hyaline cost. Most likely prerenal as a team ER because of poor by mouth intake and decreased intravascular volume. He do have an history of hematuria in 2014. He was given 1 L of normal saline bolus in ED. -Continue normal saline at 75 mL per hour. -Recheck BMP in the morning. -Urine culture to rule out any infection. -He should follow up with urology for his microscopic hematuria workup.  Worsening cough. He has a long history of chronic cough which he attributes towards smoker's cough. He denies any shortness of breath or excessive sputum production. -Mucinex twice daily. -Flutter valve.  History of intermittent dysphagia. His barrium swallow studies done in December 2014 shows distal esophageal spasms and esophageal dysmotility-EGD done in 2017 was negative. He denies any worsening of his symptoms or persistent dysphagia. -Might get benefit with repeat studies if he continue to experience those symptoms.  CODE STATUS. Full DVT prophylaxis. Lovenox Diet. Regular.   Dispo: Admit patient to Observation with expected length of stay less than 2 midnights.  SignedArnetha Courser, MD 02/17/2017, 4:36 PM  Pager: [phone]

## 2017-02-17 NOTE — H&P (Signed)
Date: 02/17/2017               Patient Name:  Brandon Taylor MRN: [id number]  DOB: 04/13/1941 Age / Sex: 77 y.o., male   PCP: System, Pcp Not In              Medical Service: Internal Medicine Teaching Service    After Hours (After 5p/  First Contact Pager: [phone]  weekends / holidays): Second Contact Pager: [phone]   Chief Complaint: weakness  History of Present Illness: Brandon Taylor is a 76 yo M with a PMH of GERD, alcohol abuse, and at least 40 pack-year smoking history who presented to the ED on 02/17/17 with a chief complaint of weakness. Over the past few weeks, pt has noticed increased weakness and decreased appetite, both of which have worsened significantly since this past Thursday; his wife reports that he has been considerably less active over the past month or so. He has not eaten anything since Thursday, and reports that while he is usually hungry (including at present), the smell of food nauseates him. He has been able to drink water, and denies any emesis. Pt also reports shortness of breath since this past Saturday, but his wife believes that this is due to generalized weakness rather than primary respiratory pathology. He also reports a cough productive of white sputum; his wife endorses that he has had a dry "smoker's cough" for several months. He denies fever, chills, aches, or other constitutional symptoms. He reports some weight loss over the past few days, but attributes this to not eating. Pt endorses some stopping and starting / a feeling of incomplete bladder emptying with urination. He denies any gross hematuria, but urinalysis showed moderate hemoglobinuria and few ketones. Cr was 2.81, BUN 114. CXR was negative for significant findings. He also endorses intermittent solid food dysphagia, which has been present for many years and is not progressive. He denies any illicit drug use, and has not used alcohol over the past several years; he quit smoking three weeks  ago.  Meds: Current Facility-Administered Medications  Medication Dose Route Frequency Provider Last Rate Last Dose  . 0.9 %  sodium chloride infusion   Intravenous Continuous Lora Paula, MD      . acetaminophen (TYLENOL) tablet 1,000 mg  1,000 mg Oral Q6H PRN Lora Paula, MD       Or  . acetaminophen (TYLENOL) suppository 650 mg  650 mg Rectal Q6H PRN Lora Paula, MD      . aspirin EC tablet 81 mg  81 mg Oral Daily Griffin Basil T, MD      . enoxaparin (LOVENOX) injection 30 mg  30 mg Subcutaneous Q24H Griffin Basil T, MD      . folic acid (FOLVITE) tablet 1 mg  1 mg Oral Daily Lora Paula, MD      . multivitamin with minerals tablet 1 tablet  1 tablet Oral Daily Lora Paula, MD      . pantoprazole (PROTONIX) EC tablet 40 mg  40 mg Oral Daily Griffin Basil T, MD      . senna-docusate (Senokot-S) tablet 1 tablet  1 tablet Oral QHS PRN Griffin Basil T, MD      . sodium chloride flush (NS) 0.9 % injection 3 mL  3 mL Intravenous Q12H Griffin Basil T, MD      . thiamine (VITAMIN B-1) tablet 100 mg  100 mg Oral Daily Lora Paula, MD  Current Outpatient Prescriptions  Medication Sig Dispense Refill  . aspirin 81 MG EC tablet Take 1 tablet (81 mg total) by mouth daily. 90 tablet 0  . CVS B-1 100 MG tablet TAKE 1 TABLET BY MOUTH EVERY DAY 100 tablet 0  . folic acid (FOLVITE) 1 MG tablet TAKE 1 TABLET BY MOUTH EVERY DAY 90 tablet 0  . pantoprazole (PROTONIX) 40 MG tablet Take 1 tablet (40 mg total) by mouth daily. 90 tablet 0  . vitamin B-12 (CYANOCOBALAMIN) 1000 MCG tablet Take 1 tablet (1,000 mcg total) by mouth daily. 90 tablet 3    Allergies: Allergies as of 02/17/2017  . (No Known Allergies)   Past Medical History:  Diagnosis Date  . ETOH abuse   . GERD (gastroesophageal reflux disease)    Past Surgical History:  Procedure Laterality Date  . NO PAST SURGERIES     Family History  Problem Relation Age of Onset  . COPD Brother     Social History   Social History  . Marital status: Married    Spouse name: N/A  . Number of children: N/A  . Years of education: N/A   Occupational History  . Not on file.   Social History Main Topics  . Smoking status: Current Every Day Smoker    Packs/day: 2.00    Years: 40.00  . Smokeless tobacco: Never Used  . Alcohol use No     Comment: fifth daily  . Drug use: No  . Sexual activity: Not on file   Other Topics Concern  . Not on file   Social History Narrative  . No narrative on file    Review of Systems: Pertinent items noted in HPI and remainder of comprehensive ROS otherwise negative.  Physical Exam: Blood pressure 140/73, pulse 81, temperature 97 F (36.1 C), temperature source Axillary, resp. rate 11, SpO2 94 %. BP 140/73   Pulse 81   Temp 97 F (36.1 C) (Axillary) Comment: unable to obtain oral or axillary temperature; 3 attempts made  Resp 11   SpO2 94%   General Appearance:    Thin man in no acute distress, appears stated age  Head:    Normocephalic, without obvious abnormality, atraumatic  Eyes:    EOM's intact, both eyes.  Lungs:     Clear to auscultation bilaterally, respirations unlabored  Chest wall:    No tenderness or deformity.  Heart:    Regular rate and rhythm, S1 and S2 normal, no murmur, rub   or gallop.  Abdomen:     Soft, non-tender, bowel sounds active all four quadrants,    no masses, several scars present from prior colectomy.   Extremities:   Extremities normal, atraumatic, no cyanosis or edema  Pulses:   2+ and symmetric all extremities  Skin:   Skin color, texture, turgor normal, no rashes or lesions  Neurologic:   CNII-XII intact. Normal strength, sensation and reflexes      throughout   Lab results:  CBC    Component Value Date/Time   WBC 7.7 02/17/2017 1026   RBC 4.84 02/17/2017 1026   HGB 14.0 02/17/2017 1026   HCT 42.8 02/17/2017 1026   PLT 205 02/17/2017 1026   MCV 88.4 02/17/2017 1026   MCH 28.9 02/17/2017  1026   MCHC 32.7 02/17/2017 1026   RDW 14.7 02/17/2017 1026   LYMPHSABS 1.3 10/24/2014 1114   MONOABS 0.3 10/24/2014 1114   EOSABS 0.1 10/24/2014 1114   BASOSABS 0.0 10/24/2014 1114   BMP  Latest Ref Rng & Units 02/17/2017 10/24/2014 01/02/2014  Glucose 65 - 99 mg/dL 914(N157(H) 76 78  BUN 6 - 20 mg/dL 829(F114(H) 16 16  Creatinine 0.61 - 1.24 mg/dL 6.21(H2.81(H) 0.861.19 5.781.16  Sodium 135 - 145 mmol/L 142 143 141  Potassium 3.5 - 5.1 mmol/L 4.5 5.8(H) 5.2  Chloride 101 - 111 mmol/L 102 106 104  CO2 22 - 32 mmol/L 22 29 28   Calcium 8.9 - 10.3 mg/dL 9.1 9.4 9.2   CMP     Component Value Date/Time   NA 142 02/17/2017 1026   K 4.5 02/17/2017 1026   CL 102 02/17/2017 1026   CO2 22 02/17/2017 1026   GLUCOSE 157 (H) 02/17/2017 1026   BUN 114 (H) 02/17/2017 1026   CREATININE 2.81 (H) 02/17/2017 1026   CREATININE 1.19 10/24/2014 1114   CALCIUM 9.1 02/17/2017 1026   PROT 7.8 02/17/2017 1331   ALBUMIN 3.3 (L) 02/17/2017 1331   AST 39 02/17/2017 1331   ALT 17 02/17/2017 1331   ALKPHOS 69 02/17/2017 1331   BILITOT 1.0 02/17/2017 1331   GFRNONAA 20 (L) 02/17/2017 1026   GFRNONAA 60 10/24/2014 1114   GFRAA 23 (L) 02/17/2017 1026   GFRAA 69 10/24/2014 1114   Urinalysis    Component Value Date/Time   COLORURINE YELLOW 02/17/2017 1440   APPEARANCEUR HAZY (A) 02/17/2017 1440   LABSPEC 1.017 02/17/2017 1440   PHURINE 5.0 02/17/2017 1440   GLUCOSEU NEGATIVE 02/17/2017 1440   HGBUR MODERATE (A) 02/17/2017 1440   BILIRUBINUR NEGATIVE 02/17/2017 1440   KETONESUR 5 (A) 02/17/2017 1440   PROTEINUR 30 (A) 02/17/2017 1440   UROBILINOGEN 1 10/24/2014 1114   NITRITE NEGATIVE 02/17/2017 1440   LEUKOCYTESUR NEGATIVE 02/17/2017 1440    Imaging results:  Dg Chest Left Decubitus  Result Date: 02/17/2017 CLINICAL DATA:  Shortness of breath. EXAM: CHEST - LEFT DECUBITUS COMPARISON:  Two-view examination 09/23/2013. FINDINGS: Two left-side-down decubitus views of the chest are submitted. Blunting of the left  costophrenic angle and scarring at the left lung base are stable. There is no significant layering pleural fluid. The right lung is clear. The heart size and mediastinal contours are stable. IMPRESSION: No significant layering pleural fluid on the left. Electronically Signed   By: Carey BullocksWilliam  Veazey M.D.   On: 02/17/2017 13:09   Assessment & Plan by Problem:  Brandon Taylor is a 76 yo M with a PMH of GERD, alcohol abuse, and at least 10 pack-year smoking history who presented to the ED on 02/17/17 with a chief complaint of weakness.    Generalized weakness: pt reports generalized weakness over the past several days, while his wife reports that he has been more idle over the past month or so. He is afebrile and hemodynamically stable, but very thin; per pt's wife, he has been roughly this thin since multiple abdominal surgeries (including partial removal of intestines) in the 1970s. Weakness may be due to uremia (see AKI below), cachexia, dehydration or deconditioning; CBC is normal and CXR clear, which suggest a noninfectious etiology.  -F/u TSH to rule out thyroid pathology -F/u ferritin to rule out anemia -F/u vitamin D, B12 -IV NS 75 for dehydration.     AKI (Acute Kidney Injury): Pt denies gross hematuria, but urinalysis showed moderate hemoglobinuria and few ketones. Cr 2.81, BUN 114. These values suggest possible AKI; BUN/CR ratio >20:1 (40.1 on admission) is consistent with prerenal azotemia secondary to dehydration.  -Renal US this afternoon (02/18/27) to evaluate for possible obstructive uropathy. -Order  FENa to confirm prerenal azotemia.  -Urine culture to r/o infection. -Re-check BMP in the morning.  -Outpatient urology f/u for hematuria.  Cough: pt's wife endorses dry "smoker's cough" over the past several months; pt endorses cough productive of white sputum more recently. -Mucinex -Pulmonary toilet w/ flutter valve -Counsel on maintaining tobacco cessation  Dysphagia: pt endorses  intermittent, non-progressive dysphagia for solid foods; 09/23/13 barium swallow demonstrated distal esophageal spasm and esophageal dysmotility, but no mass, ulceration, fixed stricture, hiatal hernia or reflux demonstrated. -Consider repeat barium swallow  This is a Psychologist, occupational Note.  The care of the patient was discussed with Dr. Arnetha Courser and the assessment and plan was formulated with their assistance.  Please see their note for official documentation of the patient encounter.   Signed: Kern Reap, Medical Student 02/17/2017, 5:17 PM

## 2017-02-17 NOTE — ED Provider Notes (Signed)
[facility]-EMER[facility]ENCY DEPT Provider Note   CSN: [id number] Arrival date & time: 02/17/17  0950     History   Chief Complaint Chief Complaint  Patient presents with  . Weakness    HPI Brandon Taylor is a 76 y.o. male.  HPI   Patient is a 76 year old male with history of GERD and alcohol abuse who presents to the ED with complaint of weakness. Patient reports over the past 3 weeks he has had gradually worsening generalized weakness. Endorses associated decreased appetite. Patient's wife at bedside reports he has been less active over the past few weeks and states he typically just sits around all day which is abnormal for him. She also states he has been eating and drinking less and reports recent weight loss. Patient reports he has had a productive cough, denies hemoptysis. Patient denies any pain. Denies fever, chills, body ache, headache, chest pain, shortness of breath, wheezing, abdominal pain, nausea, vomiting, diarrhea, constipation, urinary symptoms, blood in urine or stool, leg swelling, rash. Patient denies any recent alcohol use and states he is not had any alcohol in the past few years.   Past Medical History:  Diagnosis Date  . ETOH abuse   . GERD (gastroesophageal reflux disease)     Patient Active Problem List   Diagnosis Date Noted  . AKI (acute kidney injury) (HCC) 02/17/2017  . Surgical site reaction 12/06/2015  . Routine general medical examination at a health care facility 10/24/2014  . Vitamin B12 deficiency 10/03/2013  . Loss of weight 10/03/2013  . Dysphagia, unspecified(787.20) 10/03/2013  . Alcohol abuse 10/03/2013  . Nicotine abuse 10/03/2013  . Hypoglycemia 09/22/2013  . General weakness 09/22/2013  . SIRS (systemic inflammatory response syndrome) (HCC) 09/22/2013  . GERD (gastroesophageal reflux disease)   . ETOH abuse     Past Surgical History:  Procedure Laterality Date  . NO PAST SURGERIES         Home Medications    Prior to Admission  medications   Medication Sig Start Date End Date Taking? Authorizing Provider  aspirin 81 MG EC tablet Take 1 tablet (81 mg total) by mouth daily. 01/28/16  Yes Jegede, Olugbemiga E, MD  [facility] B-1 100 MG tablet TAKE 1 TABLET BY MOUTH EVERY DAY 12/22/16  Yes Jegede, Olugbemiga E, MD  folic acid (FOLVITE) 1 MG tablet TAKE 1 TABLET BY MOUTH EVERY DAY 12/15/16  Yes Jegede, Olugbemiga E, MD  pantoprazole (PROTONIX) 40 MG tablet Take 1 tablet (40 mg total) by mouth daily. 10/21/16  Yes Quentin Angst, MD  vitamin B-12 (CYANOCOBALAMIN) 1000 MCG tablet Take 1 tablet (1,000 mcg total) by mouth daily. 10/24/14  Yes Quentin Angst, MD    Family History Family History  Problem Relation Age of Onset  . COPD Brother     Social History Social History  Substance Use Topics  . Smoking status: Current Every Day Smoker    Packs/day: 2.00    Years: 40.00  . Smokeless tobacco: Never Used  . Alcohol use No     Comment: fifth daily     Allergies   Patient has no known allergies.   Review of Systems Review of Systems  Constitutional: Positive for appetite change (decreased) and fatigue.  Respiratory: Positive for cough.   Neurological: Positive for weakness.  All other systems reviewed and are negative.    Physical Exam Updated Vital Signs BP 140/79   Pulse 75   Temp 97 F (36.1 C) (Axillary) Comment: unable to obtain oral  or axillary temperature; 3 attempts made  Resp 19   SpO2 95%   Physical Exam  Constitutional: He is oriented to person, place, and time. He appears well-developed and well-nourished. No distress.  Thin, frail appearing male  HENT:  Head: Normocephalic and atraumatic.  Mouth/Throat: Oropharynx is clear and moist. No oropharyngeal exudate.  Eyes: Conjunctivae and EOM are normal. Right eye exhibits no discharge. Left eye exhibits no discharge. No scleral icterus.  Neck: Normal range of motion. Neck supple.  Cardiovascular: Normal rate, regular rhythm, normal heart  sounds and intact distal pulses.   Pulmonary/Chest: Effort normal and breath sounds normal. No respiratory distress. He has no wheezes. He has no rales. He exhibits no tenderness.  Abdominal: Soft. Bowel sounds are normal. He exhibits no distension and no mass. There is no tenderness. There is no rebound and no guarding.  Musculoskeletal: Normal range of motion. He exhibits no edema or tenderness.  Neurological: He is alert and oriented to person, place, and time.  Skin: Skin is warm and dry. No rash noted. He is not diaphoretic.  Nursing note and vitals reviewed.    ED Treatments / Results  Labs (all labs ordered are listed, but only abnormal results are displayed) Labs Reviewed  BASIC METABOLIC PANEL - Abnormal; Notable for the following:       Result Value   Glucose, Bld 157 (*)    BUN 114 (*)    Creatinine, Ser 2.81 (*)    GFR calc non Af Amer 20 (*)    GFR calc Af Amer 23 (*)    Anion gap 18 (*)    All other components within normal limits  URINALYSIS, ROUTINE W REFLEX MICROSCOPIC - Abnormal; Notable for the following:    APPearance HAZY (*)    Hgb urine dipstick MODERATE (*)    Ketones, ur 5 (*)    Protein, ur 30 (*)    Bacteria, UA RARE (*)    Squamous Epithelial / LPF 0-5 (*)    All other components within normal limits  HEPATIC FUNCTION PANEL - Abnormal; Notable for the following:    Albumin 3.3 (*)    All other components within normal limits  CBC  HEMOGLOBIN A1C  TSH  I-STAT CG4 LACTIC ACID, ED  I-STAT TROPOININ, ED    EKG  EKG Interpretation  Date/Time:  Tuesday Feb 17 2017 12:56:24 EDT Ventricular Rate:  79 PR Interval:    QRS Duration: 91 QT Interval:  420 QTC Calculation: 482 R Axis:   75 Text Interpretation:  Sinus rhythm Atrial premature complex Anteroseptal infarct, age indeterminate Lateral leads are also involved Baseline wander in lead(s) I II aVR V6 TW inversions in V2 and aVL are new from prior, TW more prominent in lateral leads than prior  Confirmed by [place] LLCCHLOSSMAN MD, ERIN ([id number]) on 02/17/2017 2:31:38 PM       Radiology Dg Chest Left Decubitus  Result Date: 02/17/2017 CLINICAL DATA:  Shortness of breath. EXAM: CHEST - LEFT DECUBITUS COMPARISON:  Two-view examination 09/23/2013. FINDINGS: Two left-side-down decubitus views of the chest are submitted. Blunting of the left costophrenic angle and scarring at the left lung base are stable. There is no significant layering pleural fluid. The right lung is clear. The heart size and mediastinal contours are stable. IMPRESSION: No significant layering pleural fluid on the left. Electronically Signed   By: Carey BullocksWilliam  Veazey M.D.   On: 02/17/2017 13:09    Procedures Procedures (including critical care time)  Medications Ordered in ED Medications  sodium chloride 0.9 % bolus 1,000 mL (0 mLs Intravenous Stopped 02/17/17 1502)     Initial Impression / Assessment and Plan / ED Course  I have reviewed the triage vital signs and the nursing notes.  Pertinent labs & imaging results that were available during my care of the patient were reviewed by me and considered in my medical decision making (see chart for details).     Pt presents with generalized weakness for the past few weeks with associated decreased appetite and weight loss. Reports productive cough. Denies any other pain or complaints. VSS. Exam revealed thin, frail appearing male. MMM. RRR. Lungs CTAB. Abdomen soft and nontender. Pt given IVF. Cr 2.81, BUN 114. AG 18. No leukocytosis. UA showed moderate hgb, few ketones, no signs of infection. CXR with no acute findings. Lactic acid 1.7. Troponin negative. EKG shows sinus rhythm with T-wave inversions in V2 and aVL. On reevaluation pt continues to deny having any pain or complaints. Discussed pt with Dr. Dalene Seltzer. Plan to admit pt for continued fluids for AKI. Discussed results and plan for admission with pt and family.   Final Clinical Impressions(s) / ED Diagnoses   Final  diagnoses:  AKI (acute kidney injury) [facility])    New Prescriptions New Prescriptions   No medications on file     Barrett Henle, Cordelia Poche 02/17/17 1636    Alvira Monday, MD 02/19/17 2221

## 2017-02-18 DIAGNOSIS — R3121 Asymptomatic microscopic hematuria: Secondary | ICD-10-CM | POA: Diagnosis not present

## 2017-02-18 DIAGNOSIS — R5381 Other malaise: Secondary | ICD-10-CM

## 2017-02-18 DIAGNOSIS — R1310 Dysphagia, unspecified: Secondary | ICD-10-CM

## 2017-02-18 DIAGNOSIS — D649 Anemia, unspecified: Secondary | ICD-10-CM

## 2017-02-18 DIAGNOSIS — Z79899 Other long term (current) drug therapy: Secondary | ICD-10-CM | POA: Diagnosis not present

## 2017-02-18 DIAGNOSIS — Z7982 Long term (current) use of aspirin: Secondary | ICD-10-CM | POA: Diagnosis not present

## 2017-02-18 DIAGNOSIS — F17200 Nicotine dependence, unspecified, uncomplicated: Secondary | ICD-10-CM | POA: Diagnosis not present

## 2017-02-18 DIAGNOSIS — N179 Acute kidney failure, unspecified: Secondary | ICD-10-CM | POA: Diagnosis not present

## 2017-02-18 DIAGNOSIS — R630 Anorexia: Secondary | ICD-10-CM

## 2017-02-18 DIAGNOSIS — Z681 Body mass index (BMI) 19 or less, adult: Secondary | ICD-10-CM

## 2017-02-18 LAB — BASIC METABOLIC PANEL
ANION GAP: 10 (ref 5–15)
ANION GAP: 9 (ref 5–15)
BUN: 94 mg/dL — ABNORMAL HIGH (ref 6–20)
BUN: 67 mg/dL — ABNORMAL HIGH (ref 6–20)
CALCIUM: 8 mg/dL — AB (ref 8.9–10.3)
CO2: 26 mmol/L (ref 22–32)
CO2: 21 mmol/L — AB (ref 22–32)
Calcium: 8.2 mg/dL — ABNORMAL LOW (ref 8.9–10.3)
Chloride: 105 mmol/L (ref 101–111)
Chloride: 110 mmol/L (ref 101–111)
Creatinine, Ser: 1.96 mg/dL — ABNORMAL HIGH (ref 0.61–1.24)
Creatinine, Ser: 1.57 mg/dL — ABNORMAL HIGH (ref 0.61–1.24)
GFR calc Af Amer: 36 mL/min — ABNORMAL LOW (ref >60)
GFR calc Af Amer: 47 mL/min — ABNORMAL LOW (ref >60)
GFR, EST NON AFRICAN AMERICAN: 31 mL/min — AB (ref >60)
GFR, EST NON AFRICAN AMERICAN: 41 mL/min — AB (ref >60)
Glucose, Bld: 108 mg/dL — ABNORMAL HIGH (ref 65–99)
Glucose, Bld: 133 mg/dL — ABNORMAL HIGH (ref 65–99)
Potassium: 3.8 mmol/L (ref 3.5–5.1)
Potassium: 3.6 mmol/L (ref 3.5–5.1)
Sodium: 141 mmol/L (ref 135–145)
Sodium: 140 mmol/L (ref 135–145)

## 2017-02-18 LAB — HEMOGLOBIN A1C
HEMOGLOBIN A1C: 5.8 % — AB (ref 4.8–5.6)
Mean Plasma Glucose: 120 mg/dL

## 2017-02-18 LAB — IRON AND TIBC
Iron: 68 ug/dL (ref 45–182)
SATURATION RATIOS: 33 % (ref 17.9–39.5)
TIBC: 207 ug/dL — ABNORMAL LOW (ref 250–450)
UIBC: 139 ug/dL

## 2017-02-18 LAB — RAPID URINE DRUG SCREEN, HOSP PERFORMED
Amphetamines: NOT DETECTED
Barbiturates: NOT DETECTED
Benzodiazepines: NOT DETECTED
Cocaine: NOT DETECTED
Opiates: NOT DETECTED
TETRAHYDROCANNABINOL: NOT DETECTED

## 2017-02-18 LAB — CBC
HCT: 36 % — ABNORMAL LOW (ref 39.0–52.0)
Hemoglobin: 11.8 g/dL — ABNORMAL LOW (ref 13.0–17.0)
MCH: 28.4 pg (ref 26.0–34.0)
MCHC: 32.8 g/dL (ref 30.0–36.0)
MCV: 86.7 fL (ref 78.0–100.0)
PLATELETS: 215 10*3/uL (ref 150–400)
RBC: 4.15 MIL/uL — ABNORMAL LOW (ref 4.22–5.81)
RDW: 14.5 % (ref 11.5–15.5)
WBC: 7.1 10*3/uL (ref 4.0–10.5)

## 2017-02-18 LAB — TROPONIN I: Troponin I: 0.05 ng/mL (ref <0.03)

## 2017-02-18 LAB — HIV ANTIBODY (ROUTINE TESTING W REFLEX): HIV Screen 4th Generation wRfx: NONREACTIVE

## 2017-02-18 MED ORDER — SODIUM CHLORIDE 0.9 % IV SOLN
INTRAVENOUS | Status: DC
  Administered 2017-02-18: 11:00:00 via INTRAVENOUS

## 2017-02-18 MED ORDER — ADULT MULTIVITAMIN W/MINERALS CH: 1.0000 | ORAL_TABLET | Freq: Every day | ORAL | 0 refills | Status: DC

## 2017-02-18 MED ORDER — SODIUM CHLORIDE 0.9 % IV BOLUS (SEPSIS)
1000.0000 mL | Freq: Once | INTRAVENOUS | Status: AC
  Administered 2017-02-18: 1000 mL via INTRAVENOUS

## 2017-02-18 MED ORDER — SENNOSIDES-DOCUSATE SODIUM 8.6-50 MG PO TABS: 1.0000 | ORAL_TABLET | Freq: Every evening | ORAL | 0 refills | Status: DC | PRN

## 2017-02-18 MED ORDER — ENSURE ENLIVE PO LIQD: 237.0000 mL | Freq: Two times a day (BID) | ORAL | 12 refills | Status: DC

## 2017-02-18 NOTE — Progress Notes (Signed)
   Subjective: Patient was feeling better when seen this morning, he was able to tolerate food very well. Stating that he is back to his baseline. He denied any more nausea with the smell or sight of food.  Objective:  Vital signs in last 24 hours: Vitals:   02/17/17 1804 02/17/17 2020 02/18/17 0546 02/18/17 1000  BP: 133/60 (!) 121/53 128/64 139/61  Pulse: 92 90 71 67  Resp: 20 18 18 18   Temp:  98.6 F (37 C) 97.5 F (36.4 C) 97.4 F (36.3 C)  TempSrc:  Oral Oral Oral  SpO2: 94% 90% 98% 98%  Weight: 130 lb (59 kg) 118 lb 13.3 oz (53.9 kg)    Height: 6' (1.829 m)      Gen. Emaciated gentleman, in no acute distress. Lungs. Clear bilaterally CV. Regular rate and rhythm. Abdomen. Soft, nontender, bowel sounds positive. Extremities. No edema, no cyanosis, pulses 2+ bilaterally.  Labs. BMP Latest Ref Rng & Units 02/18/2017 02/18/2017 02/17/2017  Glucose 65 - 99 mg/dL 161(W133(H) 960(A108(H) 540(J157(H)  BUN 6 - 20 mg/dL 81(X67(H) 91(Y94(H) 782(N114(H)  Creatinine 0.61 - 1.24 mg/dL 5.62(Z1.57(H) 3.08(M1.96(H) 5.78(I2.81(H)  Sodium 135 - 145 mmol/L 140 141 142  Potassium 3.5 - 5.1 mmol/L 3.6 3.8 4.5  Chloride 101 - 111 mmol/L 110 105 102  CO2 22 - 32 mmol/L 21(L) 26 22  Calcium 8.9 - 10.3 mg/dL 8.0(L) 8.2(L) 9.1   CBC Latest Ref Rng & Units 02/18/2017 02/17/2017 10/24/2014  WBC 4.0 - 10.5 K/uL 7.1 7.7 5.6  Hemoglobin 13.0 - 17.0 g/dL 11.8(L) 14.0 12.7(L)  Hematocrit 39.0 - 52.0 % 36.0(L) 42.8 38.2(L)  Platelets 150 - 400 K/uL 215 205 295   Iron/TIBC/Ferritin/ %Sat    Component Value Date/Time   IRON 68 02/18/2017 0530   TIBC 207 (L) 02/18/2017 0530   FERRITIN 932 (H) 02/17/2017 1811   IRONPCTSAT 33 02/18/2017 0530   Drugs of Abuse     Component Value Date/Time   LABOPIA NONE DETECTED 02/17/2017 1440   COCAINSCRNUR NONE DETECTED 02/17/2017 1440   LABBENZ NONE DETECTED 02/17/2017 1440   AMPHETMU NONE DETECTED 02/17/2017 1440   THCU NONE DETECTED 02/17/2017 1440   LABBARB NONE DETECTED 02/17/2017 1440    B12. 425 TSH.  0.417 Free T4. 1.42 A1c 5.8 Troponin. 0.05>>0.05  Assessment/Plan:  Brandon LoganHarry L Foustis a 76 y.o.man with PMHx off GERD, previous alcohol abuse, history of mild intermittent dysphagia came to ED with complaint of generalized malaise, weakness, decreased appetite for last 3-4 days.  Generalized malaise. His chronic fatigue may be due to deconditioning. An acute exacerbation can be explained because of his poor by mouth intake resulting in acute rise in his BUN and creatinine. His BUN and creatinine started decreasing with fluid resuscitation. He was encouraged to increased fluid intake. His TSH was within low normal liver but free T4 is elevated, he can have a overt hyperthyroidism or a euthyroid hyperthyroidism. -He need repeat testing of his thyroid functions which can be done as an outpatient. -PT is recommending outpatient physical therapy.  AKI and asymptomatic microscopic hematuria. His BUN and creatinine started trending down with fluid resuscitation. He was asked to see urology for further workup of his asymptomatic microscopic hematuria.  Anemia. His hemoglobin was at his baseline around 11 this morning, yesterday it was 14 most likely because of dehydration and volume contraction. -His iron studies are consistent with anemia of chronic illness.  Dispo: Being discharged today.  Arnetha CourserAmin, Kortland Nichols, MD 02/18/2017, 3:09 PM Pager: [phone]

## 2017-02-18 NOTE — Evaluation (Signed)
Occupational Therapy Evaluation Patient Details Name: Brandon Taylor MRN: [id number] DOB: 03/31/1941 Today's Date: 02/18/2017    History of Present Illness Brandon Taylor is a 76 yo M with a PMH of GERD, alcohol abuse, and at least 10 pack-year smoking history who presented to the ED on 02/17/17 with a chief complaint of weakness.    Clinical Impression   Pt feels he is getting stronger and is not worried about ADL Activity.  Wife will  A as needed. Pt has a tub seat if needed as well    Follow Up Recommendations  No OT follow up    Equipment Recommendations  None recommended by OT    Recommendations for Other Services       Precautions / Restrictions Precautions Precautions: Fall      Mobility Bed Mobility               General bed mobility comments: pt in chair  Transfers Overall transfer level: Needs assistance   Transfers: Sit to/from Stand;Stand Pivot Transfers Sit to Stand: Supervision Stand pivot transfers: Supervision       General transfer comment: Vc for safety and to take his time        ADL either performed or assessed with clinical judgement   ADL Overall ADL's : Needs assistance/impaired Eating/Feeding: Set up;Sitting   Grooming: Supervision/safety;Standing   Upper Body Bathing: Set up;Sitting   Lower Body Bathing: Supervison/ safety;Sit to/from stand   Upper Body Dressing : Set up;Sitting   Lower Body Dressing: Minimal assistance;Sit to/from stand;Cueing for safety;Cueing for sequencing   Toilet Transfer: Supervision/safety;Ambulation;Comfort height toilet   Toileting- Clothing Manipulation and Hygiene: Supervision/safety;Sit to/from stand;Cueing for safety   Tub/ Shower Transfer: Insurance risk surveyorMin guard Tub/Shower Transfer Details (indicate cue type and reason): verbalized safety and simulated    General ADL Comments: wife with pt almost all the time. Pt feels he is getting stronger. Pt overall S - mod I with ADL activity . Educated on safety and  taking time with ADL activity upon DC                   Pertinent Vitals/Pain Pain Assessment: No/denies pain     Hand Dominance     Extremity/Trunk Assessment Upper Extremity Assessment Upper Extremity Assessment: Generalized weakness           Communication Communication Communication: No difficulties   Cognition Arousal/Alertness: Awake/alert Behavior During Therapy: WFL for tasks assessed/performed Overall Cognitive Status: Within Functional Limits for tasks assessed                                                Home Living Family/patient expects to be discharged to:: Private residence Living Arrangements: Spouse/significant other Available Help at Discharge: Family Type of Home: House       Home Layout: One level     Bathroom Shower/Tub: Chief Strategy OfficerTub/shower unit   Bathroom Toilet: Handicapped height     Home Equipment: Medical laboratory scientific officerCane - single point          Prior Functioning/Environment Level of Independence: Independent                          OT Goals(Current goals can be found in the care plan section) Acute Rehab OT Goals Patient Stated Goal: get home and be able to fish OT Goal  Formulation: With patient  OT Frequency:                AM-PAC PT "6 Clicks" Daily Activity     Outcome Measure Help from another person eating meals?: None Help from another person taking care of personal grooming?: None Help from another person toileting, which includes using toliet, bedpan, or urinal?: None Help from another person bathing (including washing, rinsing, drying)?: A Little Help from another person to put on and taking off regular upper body clothing?: None Help from another person to put on and taking off regular lower body clothing?: A Little 6 Click Score: 22   End of Session    Activity Tolerance: Patient tolerated treatment well Patient left: in chair;with call bell/phone within reach;with family/visitor present  OT Visit  Diagnosis: Muscle weakness (generalized) (M62.81)                Time: 1610-9604 OT Time Calculation (min): 23 min Charges:  OT General Charges $OT Visit: 1 Procedure OT Evaluation $OT Eval Moderate Complexity: 1 Procedure OT Treatments $Self Care/Home Management : 8-22 mins G-Codes: OT G-codes **NOT FOR INPATIENT CLASS** Functional Assessment Tool Used: Clinical judgement Functional Limitation: Self care Self Care Current Status (V4098): At least 20 percent but less than 40 percent impaired, limited or restricted Self Care Goal Status (J1914): At least 1 percent but less than 20 percent impaired, limited or restricted Self Care Discharge Status [phone]): At least 20 percent but less than 40 percent impaired, limited or restricted   Lise Auer, [place] [phone]  Einar Crow D 02/18/2017, 12:35 PM

## 2017-02-18 NOTE — Evaluation (Signed)
Physical Therapy Evaluation Patient Details Name: Brandon Taylor MRN: [id number] DOB: 06-01-1941 Today's Date: 02/18/2017   History of Present Illness  Brandon Taylor is a 76 yo M with a PMH of GERD, alcohol abuse, and at least 10 pack-year smoking history who presented to the ED on 02/17/17 with a chief complaint of weakness.   Clinical Impression   Patient evaluated by Physical Therapy with no further acute PT needs identified. All education has been completed and the patient has no further questions. Noted gross unsteadiness with gait, benfits form using a cane, which he has several of at home; Unsteadiness on stairs as well; suggest he use a step-to strategy; We discussed going to Outpatient PT for balance if he feels his balance isn't back to baseline soon; Pt attributes his unsteadiness today to not eating much in the past week, which is not unreasonable;  See below for any follow-up Physical Therapy or equipment needs. PT is signing off. Thank you for this referral.     Follow Up Recommendations Outpatient PT;Other (comment) (for gait and balance dysfunction)    Equipment Recommendations  None recommended by PT;Other (comment) (has a cane at home)    Recommendations for Other Services       Precautions / Restrictions Precautions Precautions: Fall      Mobility  Bed Mobility Overal bed mobility: Modified Independent             General bed mobility comments: Increased tiem  Transfers Overall transfer level: Needs assistance Equipment used: None Transfers: Sit to/from Stand Sit to Stand: Supervision Stand pivot transfers: Supervision       General transfer comment: Vc for safety and to take his time  Ambulation/Gait Ambulation/Gait assistance: Supervision Ambulation Distance (Feet): 200 Feet Assistive device: None       General Gait Details: 2-3 small losses of balance which pt was able to regain footing and balance using stepping strategy without physical  assist; used cane well on the way back to his room well  Stairs Stairs: Yes Stairs assistance: Min guard Stair Management: No rails Number of Stairs: 3 General stair comments: some unsteadiness noted as pt attempted alternating pattern, used rails to stabilize; better with step-to strategy  Wheelchair Mobility    Modified Rankin (Stroke Patients Only)       Balance                                             Pertinent Vitals/Pain Pain Assessment: No/denies pain    Home Living Family/patient expects to be discharged to:: Private residence Living Arrangements: Spouse/significant other Available Help at Discharge: Family Type of Home: House Home Access: Stairs to enter Entrance Stairs-Rails: None Technical brewer of Steps: 3 Home Layout: One level Home Equipment: Cane - single point      Prior Function Level of Independence: Independent         Comments: enjoys fishing     Journalist, newspaper        Extremity/Trunk Assessment   Upper Extremity Assessment Upper Extremity Assessment: Defer to OT evaluation    Lower Extremity Assessment Lower Extremity Assessment: Generalized weakness       Communication   Communication: No difficulties  Cognition Arousal/Alertness: Awake/alert Behavior During Therapy: WFL for tasks assessed/performed Overall Cognitive Status: Within Functional Limits for tasks assessed  General Comments      Exercises     Assessment/Plan    PT Assessment All further PT needs can be met in the next venue of care  PT Problem List Decreased strength;Decreased activity tolerance;Decreased balance       PT Treatment Interventions      PT Goals (Current goals can be found in the Care Plan section)  Acute Rehab PT Goals Patient Stated Goal: get home and be able to fish PT Goal Formulation: All assessment and education complete, DC therapy    Frequency      Barriers to discharge        Co-evaluation               AM-PAC PT "6 Clicks" Daily Activity  Outcome Measure Difficulty turning over in bed (including adjusting bedclothes, sheets and blankets)?: None Difficulty moving from lying on back to sitting on the side of the bed? : None Difficulty sitting down on and standing up from a chair with arms (e.g., wheelchair, bedside commode, etc,.)?: None Help needed moving to and from a bed to chair (including a wheelchair)?: None Help needed walking in hospital room?: None Help needed climbing 3-5 steps with a railing? : A Little 6 Click Score: 23    End of Session Equipment Utilized During Treatment: Gait belt Activity Tolerance: Patient tolerated treatment well Patient left: in chair;with call bell/phone within reach Nurse Communication: Mobility status PT Visit Diagnosis: Unsteadiness on feet (R26.81)    Time: 1100-1130 (Start and end times are approx) PT Time Calculation (min) (ACUTE ONLY): 30 min   Charges:   PT Evaluation $PT Eval Low Complexity: 1 Procedure PT Treatments $Gait Training: 8-22 mins   PT G Codes:   PT G-Codes **NOT FOR INPATIENT CLASS** Functional Assessment Tool Used: Clinical judgement Functional Limitation: Mobility: Walking and moving around Mobility: Walking and Moving Around Current Status (I4332): At least 1 percent but less than 20 percent impaired, limited or restricted Mobility: Walking and Moving Around Goal Status [phone]): 0 percent impaired, limited or restricted Mobility: Walking and Moving Around Discharge Status [phone]): At least 1 percent but less than 20 percent impaired, limited or restricted    Roney Marion, [place] Pager [phone] Office [place] 02/18/2017, 1:51 PM

## 2017-02-18 NOTE — Care Management Obs Status (Signed)
MEDICARE OBSERVATION STATUS NOTIFICATION   Patient Details  Name: Brandon Taylor MRN: [id number] Date of Birth: 12/20/1940   Medicare Observation Status Notification Given:  Yes    Akisha Sturgill, Annamarie MajorCheryl U, RN 02/18/2017, 3:05 PM

## 2017-02-18 NOTE — Care Management Note (Signed)
Case Management Note  Patient Details  Name: KAMAURI DENARDO MRN: [id number] Date of Birth: [birth year]  Subjective/Objective:     CM following for progression and d/c planning.                Action/Plan: 02/18/2017 Noted PT recommendation for outpt PT. Spoke with MD who as asked CM to assist with arranging outpatient PT. Met with pt and wife, who at this time are both declining outpatient PT. This was discussed as an option to inprove balance, endurance etc, however both continue to decline offer. Pt declined offers for any DME stating that he has no needs at this time.   Expected Discharge Date:  02/18/2017             Expected Discharge Plan:  Home/Self Care  In-House Referral:  NA  Discharge planning Services  CM Consult  Post Acute Care Choice:  NA Choice offered to:  Patient  DME Arranged:   NA DME Agency:  NA  HH Arranged:  NA HH Agency:  NA  Status of Service:  Completed, signed off  If discussed at [place] of Stay Meetings, dates discussed:    Additional Comments:  Adron Bene, RN 02/18/2017, 3:32 PM

## 2017-02-18 NOTE — Progress Notes (Signed)
Subjective: [name] feels much better today, and reports that his voice has returned to its usual timbre. He ate a dinner of chicken nuggets and french fries last night, and eggs and sausage this morning. He reports that the smell of food no longer nauseates him; he believes that he was nauseated specifically by the smell of fish, which his wife frequently cooks. He has no complaints at present.   Objective: Vital signs in last 24 hours: Vitals:   02/17/17 1804 02/17/17 2020 02/18/17 0546 02/18/17 1000  BP: 133/60 (!) 121/53 128/64 139/61  Pulse: 92 90 71 67  Resp: 20 18 18 18   Temp:  98.6 F (37 C) 97.5 F (36.4 C) 97.4 F (36.3 C)  TempSrc:  Oral Oral Oral  SpO2: 94% 90% 98% 98%  Weight: 59 kg (130 lb) 53.9 kg (118 lb 13.3 oz)    Height: 6' (1.829 m)      Weight change:   Intake/Output Summary (Last 24 hours) at 02/18/17 1514 Last data filed at 02/18/17 0900  Gross per 24 hour  Intake           1567.5 ml  Output              775 ml  Net            792.5 ml   BP 139/61 (BP Location: Right Arm)   Pulse 67   Temp 97.4 F (36.3 C) (Oral)   Resp 18   Ht 6' (1.829 m)   Wt 53.9 kg (118 lb 13.3 oz)   SpO2 98%   BMI 16.12 kg/m   General Appearance:    Alert, cooperative, no distress, appears stated age  Head:    Normocephalic, without obvious abnormality, atraumatic  Lungs:     Clear to auscultation bilaterally, respirations unlabored  Chest wall:    No tenderness or deformity  Heart:    Regular rate and rhythm, S1 and S2 normal, no murmur, rub   or gallop  Abdomen:     Soft, non-tender, bowel sounds active all four quadrants, several scars present from prior abdominal surgery.   Extremities:   Extremities normal, atraumatic, no cyanosis or edema  Skin:   Skin color, texture, turgor normal, no rashes or lesions   Lab Results:  CBC    Component Value Date/Time   WBC 7.1 02/18/2017 0320   RBC 4.15 (L) 02/18/2017 0320   HGB 11.8 (L) 02/18/2017 0320   HCT 36.0 (L)  02/18/2017 0320   PLT 215 02/18/2017 0320   MCV 86.7 02/18/2017 0320   MCH 28.4 02/18/2017 0320   MCHC 32.8 02/18/2017 0320   RDW 14.5 02/18/2017 0320   LYMPHSABS 1.3 10/24/2014 1114   MONOABS 0.3 10/24/2014 1114   EOSABS 0.1 10/24/2014 1114   BASOSABS 0.0 10/24/2014 1114   BMP Latest Ref Rng & Units 02/18/2017 02/18/2017 02/17/2017  Glucose 65 - 99 mg/dL 295(M133(H) 841(L108(H) 244(W157(H)  BUN 6 - 20 mg/dL 10(U67(H) 72(Z94(H) 366(Y114(H)  Creatinine 0.61 - 1.24 mg/dL 4.03(K1.57(H) 7.42(V1.96(H) 9.56(L2.81(H)  Sodium 135 - 145 mmol/L 140 141 142  Potassium 3.5 - 5.1 mmol/L 3.6 3.8 4.5  Chloride 101 - 111 mmol/L 110 105 102  CO2 22 - 32 mmol/L 21(L) 26 22  Calcium 8.9 - 10.3 mg/dL 8.0(L) 8.2(L) 9.1   Studies/Results: Dg Chest Left Decubitus  Result Date: 02/17/2017 CLINICAL DATA:  Shortness of breath. EXAM: CHEST - LEFT DECUBITUS COMPARISON:  Two-view examination 09/23/2013. FINDINGS: Two left-side-down decubitus views of the  chest are submitted. Blunting of the left costophrenic angle and scarring at the left lung base are stable. There is no significant layering pleural fluid. The right lung is clear. The heart size and mediastinal contours are stable. IMPRESSION: No significant layering pleural fluid on the left. Electronically Signed   By: Carey Bullocks M.D.   On: 02/17/2017 13:09   US Renal  Result Date: 02/17/2017 CLINICAL DATA:  Acute kidney injury. EXAM: RENAL / URINARY TRACT ULTRASOUND COMPLETE COMPARISON:  None. FINDINGS: Right Kidney: Length: 8.8 cm. Mild thinning of the renal parenchyma and increased renal echogenicity. Cyst in the mid kidney measures 1.3 cm. No solid mass or hydronephrosis visualized. Left Kidney: Length: 9.7 cm. Mild increased renal echogenicity. Cyst in the mid kidney measures 1 cm. No solid mass or hydronephrosis visualized. Bladder: Appears normal for degree of bladder distention. Incidental note of prominent prostate gland measuring 4.8 x 4.6 x 2.4 cm. IMPRESSION: Mild increased renal echogenicity  consistent with chronic medical renal disease. No obstructive uropathy. Small bilateral renal cysts. Electronically Signed   By: Rubye Oaks M.D.   On: 02/17/2017 17:46   Medications: Scheduled Meds: . aspirin EC  81 mg Oral Daily  . enoxaparin (LOVENOX) injection  30 mg Subcutaneous Q24H  . feeding supplement (ENSURE ENLIVE)  237 mL Oral BID BM  . folic acid  1 mg Oral Daily  . multivitamin with minerals  1 tablet Oral Daily  . pantoprazole  40 mg Oral Daily  . sodium chloride flush  3 mL Intravenous Q12H  . thiamine  100 mg Oral Daily   Continuous Infusions: . sodium chloride 75 mL/hr at 02/18/17 1118   PRN Meds:.acetaminophen **OR** acetaminophen, senna-docusate    Assessment/Plan: Brandon Taylor is a 76 yo M with a PMH of GERD, alcohol abuse, and at least 10 pack-year smoking history who presented to the ED on 02/17/17 with a chief complaint of weakness.   Generalized weakness: pt reports that weakness has improved significantly since receiving IV NS 75. Weakness was likely secondary to dehydration c/b prerenal azotemia. Pt has been encouraged to drink more water at home.  -PT consult today, appreciate post-discharge recommendations  AKI (Acute Kidney Injury): Pt's history + initial Cr 2.81, BUN 114, and BUN/CR ratio of 40:1 suggest prerenal AKI 2/2 dehydration. After IV rehydration, pt's Cr had improved significantly to 1.96 as of this morning. Pt has been encouraged to drink more water upon discharge, which should help his Cr to correct further. -Urine culture collected yesterday (02/17/17); will monitor for growth, but improvement on fluids suggests non-infectious etiology.  -Outpatient urology f/u for hematuria; we will arrange appt.  Anorexia/dysphagia: pt initially endorsed intermittent, non-progressive dysphagia for solid foods, as well as several days of anorexia during which the smell of food nauseated him. 09/23/13 barium swallow demonstrated distal esophageal spasm and  esophageal dysmotility, but no mass, ulceration, fixed stricture, hiatal hernia or reflux demonstrated. While there is no clear etiology to his anorexia, pt is now back to eating food without issue, and should follow up on outpatient basis.   Cough: pt's wife endorses dry "smoker's cough" over the past several months; pt endorses cough productive of white sputum more recently. -Mucinex -Pulmonary toilet w/ flutter valve -Counsel on maintaining tobacco cessation  Dispo: discharge on home medications pending PT evaluation. Pt should follow up with PCP and urology on outpatient basis; we are arranging both appointments.  This is a Psychologist, occupational Note.  The care of the patient was discussed  with Dr. Arnetha Courser and the assessment and plan formulated with their assistance.  Please see their attached note for official documentation of the daily encounter.   LOS: 0 days   Kern Reap, Medical Student 02/18/2017, 3:14 PM

## 2017-02-18 NOTE — Discharge Summary (Signed)
Name: Brandon Taylor MRN: [id number] DOB: 01/16/1941 76 y.o. PCP: System, Pcp Not In  Date of Admission: 02/17/2017 12:35 PM Date of Discharge: 02/18/2017 Attending Physician: Burns Spain, MD  Discharge Diagnosis: 1. AKI most likely due to dehydration.   Discharge Medications: Allergies as of 02/18/2017   No Known Allergies     Medication List    TAKE these medications   aspirin 81 MG EC tablet Take 1 tablet (81 mg total) by mouth daily.   [employer] B-1 100 MG tablet Generic drug:  thiamine TAKE 1 TABLET BY MOUTH EVERY DAY   feeding supplement (ENSURE ENLIVE) Liqd Take 237 mLs by mouth 2 (two) times daily between meals. Start taking on:  02/19/2017   folic acid 1 MG tablet Commonly known as:  FOLVITE TAKE 1 TABLET BY MOUTH EVERY DAY   multivitamin with minerals Tabs tablet Take 1 tablet by mouth daily. Start taking on:  02/19/2017   pantoprazole 40 MG tablet Commonly known as:  PROTONIX Take 1 tablet (40 mg total) by mouth daily.   senna-docusate 8.6-50 MG tablet Commonly known as:  Senokot-S Take 1 tablet by mouth at bedtime as needed for mild constipation.   vitamin B-12 1000 MCG tablet Commonly known as:  CYANOCOBALAMIN Take 1 tablet (1,000 mcg total) by mouth daily.       Disposition and follow-up:   Mr.Brandon Taylor was discharged from [facility] in Good condition.  At the hospital follow up visit please address:  1.  Please repeat his thyroid function testing. -He was made an appointment with urology because of his asymptomatic hematuria. -He was also advised to have physical therapy done as an outpatient to increase his endurance. -Please check his kidney functions as we don't know his baseline but they improved with IV rehydration.  2.  Labs / imaging needed at time of follow-up: BMP, TSH, Free T4 and Total T3.  3.  Pending labs/ test needing follow-up: Vit. D  Follow-up Appointments:   Hospital Course by problem  list:  Brandon Taylor a 76 y.o.man with PMHx off GERD,previous alcohol abuse,history of mild intermittent dysphagia came to ED with complaint of generalized malaise, weakness, decreased appetite for last 3-4 days.  Generalized malaise. According to his wife that patient is not active for last few months, and this acute exacerbation of his symptoms with decreased appetite and increased weakness was most likely because of severe dehydration due to poor by mouth intake. He responded very well to IV fluid resuscitation. Next morning according to patient he was back to his baseline.  His TSH was within low normal liver but free T4 is elevated, he can have a overt hyperthyroidism or a euthyroid hyperthyroidism. He need repeat testing of his thyroid functions which can be done as an outpatient.  He was recommended to do outpatient physical therapy because of his physical deconditioning, patient and his wife both refused at this time. His PCP should be able to evaluate and give him referral if needed.  AKI (Acute Kidney Injury): On initial presentation his BUN was 114 and creatinine was 2.81, most likely responsible for his current symptoms of decreased appetite and increased sleepiness and tiredness. He appears very dry, his kidney function improved with IV fluid resuscitation. His appetite normalized. On discharge his BUN was 67 and creatinine was 1.57. He was advised to increase his fluid intake. He will need a follow-up BMP by his PCP during hospital follow-up.  Asymptomatic microscopic hematuria. He  was found to have microscopic hematuria on UA. Patient denied any urinary symptoms except inability to evacuate bladder completely and some difficulty with starting and stopping micturition-most likely due to BPH. He was given an appointment with [facility] to follow-up on his asymptomatic microscopic hematuria.  Anorexia/dysphagia: pt initially endorsed intermittent, non-progressive  dysphagia for solid foods, as well as several days of anorexia during which the smell of food nauseated him. 09/23/13 barium swallow demonstrated distal esophageal spasm and esophageal dysmotility, but no mass, ulceration,fixed stricture,hiatal hernia or reflux demonstrated. He has EGD and colonoscopy done in 2015 which was normal.   Anemia. His hemoglobin was at his baseline around 11 this morning, yesterday it was 14 most likely because of dehydration and volume contraction. -His iron studies are consistent with anemia of chronic illness. His PCP should be able to follow-up on that.  Discharge Vitals:   BP 139/61 (BP Location: Right Arm)   Pulse 67   Temp 97.4 F (36.3 C) (Oral)   Resp 18   Ht 6' (1.829 m)   Wt 118 lb 13.3 oz (53.9 kg)   SpO2 98%   BMI 16.12 kg/m   Gen. Emaciated gentleman, in no acute distress. Lungs. Clear bilaterally CV. Regular rate and rhythm. Abdomen. Soft, nontender, bowel sounds positive. Extremities. No edema, no cyanosis, pulses 2+ bilaterally.  Pertinent Labs, Studies, and Procedures:  CBC Latest Ref Rng & Units 02/18/2017 02/17/2017 10/24/2014  WBC 4.0 - 10.5 K/uL 7.1 7.7 5.6  Hemoglobin 13.0 - 17.0 g/dL 11.8(L) 14.0 12.7(L)  Hematocrit 39.0 - 52.0 % 36.0(L) 42.8 38.2(L)  Platelets 150 - 400 K/uL 215 205 295   CMP Latest Ref Rng & Units 02/18/2017 02/18/2017 02/17/2017  Glucose 65 - 99 mg/dL 213(Y133(H) 865(H108(H) 846(N157(H)  BUN 6 - 20 mg/dL 62(X67(H) 52(W94(H) 413(K114(H)  Creatinine 0.61 - 1.24 mg/dL 4.40(N1.57(H) 0.27(O1.96(H) 5.36(U2.81(H)  Sodium 135 - 145 mmol/L 140 141 142  Potassium 3.5 - 5.1 mmol/L 3.6 3.8 4.5  Chloride 101 - 111 mmol/L 110 105 102  CO2 22 - 32 mmol/L 21(L) 26 22  Calcium 8.9 - 10.3 mg/dL 8.0(L) 8.2(L) 9.1  Total Protein 6.5 - 8.1 g/dL - - 7.8  Total Bilirubin 0.3 - 1.2 mg/dL - - 1.0  Alkaline Phos 38 - 126 U/L - - 69  AST 15 - 41 U/L - - 39  ALT 17 - 63 U/L - - 17   Urinalysis    Component Value Date/Time   COLORURINE YELLOW 02/17/2017 1440   APPEARANCEUR HAZY  (A) 02/17/2017 1440   LABSPEC 1.017 02/17/2017 1440   PHURINE 5.0 02/17/2017 1440   GLUCOSEU NEGATIVE 02/17/2017 1440   HGBUR MODERATE (A) 02/17/2017 1440   BILIRUBINUR NEGATIVE 02/17/2017 1440   KETONESUR 5 (A) 02/17/2017 1440   PROTEINUR 30 (A) 02/17/2017 1440   UROBILINOGEN 1 10/24/2014 1114   NITRITE NEGATIVE 02/17/2017 1440   LEUKOCYTESUR NEGATIVE 02/17/2017 1440   Drugs of Abuse     Component Value Date/Time   LABOPIA NONE DETECTED 02/17/2017 1440   COCAINSCRNUR NONE DETECTED 02/17/2017 1440   LABBENZ NONE DETECTED 02/17/2017 1440   AMPHETMU NONE DETECTED 02/17/2017 1440   THCU NONE DETECTED 02/17/2017 1440   LABBARB NONE DETECTED 02/17/2017 1440    Iron/TIBC/Ferritin/ %Sat    Component Value Date/Time   IRON 68 02/18/2017 0530   TIBC 207 (L) 02/18/2017 0530   FERRITIN 932 (H) 02/17/2017 1811   IRONPCTSAT 33 02/18/2017 0530   Troponin.0.05>>0.05 B12. 425 TSH. 0.417 Free T4. 1.42 A1c  5.8  EKG: Sinus rhythm with peaked T waves and baseline wander.  CXR: FINDINGS: Two left-side-down decubitus views of the chest are submitted. Blunting of the left costophrenic angle and scarring at the left lung base are stable. There is no significant layering pleural fluid. The right lung is clear. The heart size and mediastinal contours are stable.  IMPRESSION: No significant layering pleural fluid on the left.  US Renal. FINDINGS: Right Kidney:  Length: 8.8 cm. Mild thinning of the renal parenchyma and increased renal echogenicity. Cyst in the mid kidney measures 1.3 cm. No solid mass or hydronephrosis visualized.  Left Kidney:  Length: 9.7 cm. Mild increased renal echogenicity. Cyst in the mid kidney measures 1 cm. No solid mass or hydronephrosis visualized.  Bladder:  Appears normal for degree of bladder distention.  Incidental note of prominent prostate gland measuring 4.8 x 4.6 x 2.4 cm.  IMPRESSION: Mild increased renal echogenicity consistent with  chronic medical renal disease. No obstructive uropathy.  Small bilateral renal cysts.  Discharge Instructions: Discharge Instructions    Diet - low sodium heart healthy    Complete by:  As directed    Discharge instructions    Complete by:  As directed    It was pleasure taking care of you. Please follow up with your primary care within one week. Please follow up with [facility] as directed. He will be getting instructions along with your appointment confirmation from their office. Please drink plenty of fluids and eat healthy.   Increase activity slowly    Complete by:  As directed       Signed: Arnetha Courser, MD 02/18/2017, 3:52 PM   Pager: [phone]

## 2017-02-18 NOTE — Progress Notes (Signed)
Date: 02/18/2017  Patient name: Brandon Taylor  Medical record number: [id number]  Date of birth: 05/06/1941   I have seen and evaluated Brandon Taylor and discussed their care with the Residency Team. Brandon Taylor is a 76 year old man who came to the emergency department with a chief complaint of weakness and anorexia. I have reviewed Dr. Shanda BumpsAmin's history on admit and we got no new information from the patient or his wife today.  On admission, the patient had acute kidney injury which was felt to be prerenal as he had not been taking much by mouth. He was hydrated with IV fluids and his creatinine has trended down appropriately but is still slightly above baseline. Due to his age, a renal ultrasound was done to rule out post renal causes and he had no hydronephrosis or hydroureter. It simply showed changes consistent with chronic medical renal disease.  This morning, the patient reports that he is much better and wants to go home. He was able to eat a good dinner and a good breakfast. He states that he thinks he was getting nauseous at home because his wife would fix fish and the smell of the fish would make him sick. He had no dysphasia to either meal. He feels that his voice is stronger today. Due to the patient's complaint of generalized weakness, we obtained a PT and OT consult. OT has evaluated the patient and has no recommendations for follow-up.   PMHx, Fam Hx, and/or Soc Hx : Medical history includes prior small bowel excision, resultant B12 deficiency, GERD, alcohol use, dysphasia, EGD which showed no abnormalities. Family history is pertinent for COPD. Social history : He is married and lives with his wife. He has recurrent every day smoker.   Vitals:   02/18/17 0546 02/18/17 1000  BP: 128/64 139/61  Pulse: 71 67  Resp: 18 18  Temp: 97.5 F (36.4 C) 97.4 F (36.3 C)  Gen Thin man, NAD HRRR No MRG LCTAB with good air flow ABD + BS, soft, no aortic pulsation Ext no edema Neuro moving all 4  ext  Cr 2.81 - 1.94 (Baseline 1.19) HgB 11.8 UA 6-30 RBC  I personally viewed his CXR images  (L lateral) and confirmed by reading with the official read. Over penetrated, slight rotation, no edema  I personally viewed his EKG and confirmed by reading with the official read. Sinus, nl axis, no ischemic changes.'  Assessment and Plan: I have seen and evaluated the patient as outlined above. I agree with the formulated Assessment and Plan as detailed in the residents' note, with the following changes:   1. Acute kidney injury - the etiology of this is prerenal. He was not eating or drinking much prior to admission. His creatinine has responded appropriately to IV fluids although he is not yet at his baseline creatinine. Since he is now able to eat and drink normally, there is no reason to think that it will not correct over the next couple days at home.   2. Anorexia - there really is no clear etiology to this symptom. We found no infection present. He had no electrolyte abnormalities. He denied dysphagia when eating hospital. I do not think that we will be able to find the etiology even with a more extensive investigation. Since he is eating and drinking normally, we will discharge him to home and have him follow-up as an outpatient.  3. Persistent microscopic hematuria - his risk factors include age, sex, and tobacco use. He  will need an outpatient follow-up appointment with urology and we are arranging.  4. Generalized weakness - this is likely secondary to the anorexia any acute kidney injury. This seems to have resolved now that he is able to eat and drink normally. We are awaiting PT evaluation. OT did not indicate he needed any further services outside hospital.  Discharge to home on home medications once physical therapy evaluation is complete. Follow-up with PCP and urology.  Burns Spain, MD 5/9/201812:42 PM

## 2017-02-18 NOTE — Progress Notes (Signed)
Discharge instructions (including medications) discussed with and copy provided to patient/caregiver 

## 2017-02-19 LAB — URINE CULTURE: Culture: NO GROWTH

## 2017-02-19 LAB — VITAMIN D 25 HYDROXY (VIT D DEFICIENCY, FRACTURES): Vit D, 25-Hydroxy: 9.2 ng/mL — ABNORMAL LOW (ref 30.0–100.0)

## 2017-03-13 ENCOUNTER — Other Ambulatory Visit: Payer: Self-pay | Admitting: Internal Medicine

## 2017-03-22 ENCOUNTER — Other Ambulatory Visit: Payer: Self-pay | Admitting: Internal Medicine

## 2017-03-27 ENCOUNTER — Other Ambulatory Visit: Payer: Self-pay | Admitting: Internal Medicine

## 2017-04-03 ENCOUNTER — Other Ambulatory Visit: Payer: Self-pay | Admitting: Internal Medicine

## 2017-04-09 DIAGNOSIS — R3129 Other microscopic hematuria: Secondary | ICD-10-CM | POA: Diagnosis not present

## 2017-04-10 ENCOUNTER — Encounter: Payer: Self-pay | Admitting: Family Medicine

## 2017-04-10 ENCOUNTER — Ambulatory Visit: Payer: Medicare Other | Attending: Family Medicine | Admitting: Family Medicine

## 2017-04-10 VITALS — BP 122/67 | HR 77 | Temp 98.0°F | Resp 18 | Ht 70.5 in | Wt 129.0 lb

## 2017-04-10 DIAGNOSIS — K219 Gastro-esophageal reflux disease without esophagitis: Secondary | ICD-10-CM | POA: Insufficient documentation

## 2017-04-10 DIAGNOSIS — Z09 Encounter for follow-up examination after completed treatment for conditions other than malignant neoplasm: Secondary | ICD-10-CM

## 2017-04-10 DIAGNOSIS — Z13220 Encounter for screening for lipoid disorders: Secondary | ICD-10-CM | POA: Diagnosis not present

## 2017-04-10 DIAGNOSIS — E538 Deficiency of other specified B group vitamins: Secondary | ICD-10-CM | POA: Diagnosis not present

## 2017-04-10 DIAGNOSIS — Z760 Encounter for issue of repeat prescription: Secondary | ICD-10-CM | POA: Diagnosis not present

## 2017-04-10 DIAGNOSIS — R410 Disorientation, unspecified: Secondary | ICD-10-CM | POA: Diagnosis not present

## 2017-04-10 MED ORDER — PANTOPRAZOLE SODIUM 40 MG PO TBEC: 40.0000 mg | DELAYED_RELEASE_TABLET | Freq: Every day | ORAL | 3 refills | Status: DC

## 2017-04-10 MED ORDER — VITAMIN B-12 1000 MCG PO TABS: 1000.0000 ug | ORAL_TABLET | Freq: Every day | ORAL | 3 refills | Status: DC

## 2017-04-10 MED ORDER — THIAMINE HCL 100 MG PO TABS: 100.0000 mg | ORAL_TABLET | Freq: Every day | ORAL | 3 refills | Status: DC

## 2017-04-10 MED ORDER — ADULT MULTIVITAMIN W/MINERALS CH: 1.0000 | ORAL_TABLET | Freq: Every day | ORAL | 0 refills | Status: DC

## 2017-04-10 MED ORDER — FOLIC ACID 1 MG PO TABS: 1.0000 mg | ORAL_TABLET | Freq: Every day | ORAL | 3 refills | Status: DC

## 2017-04-10 MED ORDER — ASPIRIN 81 MG PO TBEC: 81.0000 mg | DELAYED_RELEASE_TABLET | Freq: Every day | ORAL | 3 refills | Status: DC

## 2017-04-10 NOTE — Progress Notes (Signed)
Subjective:  Patient ID: Brandon Taylor, male    DOB: 10/20/1940  Age: 76 y.o. MRN: [id number]  CC: Medication Refill   HPI Brandon BreedHarry L Taylor presents for medication refill. PMH of GERD, ETOH abuse, and vitamin B12 deficiency. He is accompanied by his wife. He denies dysphagia, hematochezia, and melena. Reports taking protonix for symptoms for relief of symptoms. He report discontinuing alcohol use 3 years ago. History of ED visit in May 2018 for malaise, weakness, and poor appetite over several days. PT was recommended but patient and wife refused. AKI r/t dehydration, it was recommended that he have a follow up appointment with PCP for BMP check. Microscopic hematuria found during hospital stay, symptoms of difficulty starting or stopping suspected to due BPH. Referral to was urology made. Patient and wife report following up with urology referral. Patient denies any hematuria or dysuria since hospitalization.    Outpatient Medications Prior to Visit  Medication Sig Dispense Refill  . feeding supplement, ENSURE ENLIVE, (ENSURE ENLIVE) LIQD Take 237 mLs by mouth 2 (two) times daily between meals. 237 mL 12  . senna-docusate (SENOKOT-S) 8.6-50 MG tablet Take 1 tablet by mouth at bedtime as needed for mild constipation. 30 tablet 0  . aspirin 81 MG EC tablet Take 1 tablet (81 mg total) by mouth daily. 90 tablet 0  . [facility] B-1 100 MG tablet TAKE 1 TABLET BY MOUTH EVERY DAY 100 tablet 0  . folic acid (FOLVITE) 1 MG tablet TAKE 1 TABLET BY MOUTH EVERY DAY 90 tablet 0  . Multiple Vitamin (MULTIVITAMIN WITH MINERALS) TABS tablet Take 1 tablet by mouth daily. 60 tablet 0  . pantoprazole (PROTONIX) 40 MG tablet Take 1 tablet (40 mg total) by mouth daily. 90 tablet 0  . vitamin B-12 (CYANOCOBALAMIN) 1000 MCG tablet Take 1 tablet (1,000 mcg total) by mouth daily. 90 tablet 3   No facility-administered medications prior to visit.     ROS Review of Systems  Constitutional: Negative.   HENT: Negative for  trouble swallowing.   Respiratory: Negative.   Cardiovascular: Negative.   Gastrointestinal: Negative.   Genitourinary: Negative.   Musculoskeletal: Negative.   Skin: Negative.   Neurological: Negative.   Psychiatric/Behavioral: Negative.     Objective:  BP 122/67 (BP Location: Right Arm, Patient Position: Sitting, Cuff Size: Normal)   Pulse 77   Temp 98 F (36.7 C) (Oral)   Resp 18   Ht 5' 10.5" (1.791 m)   Wt 129 lb (58.5 kg)   SpO2 99%   BMI 18.25 kg/m   BP/Weight 04/10/2017 02/18/2017 02/17/2017  Systolic BP 122 139 -  Diastolic BP 67 61 -  Wt. (Lbs) 129 - 118.83  BMI 18.25 - 16.12     Physical Exam  Constitutional: He appears well-developed and well-nourished.  HENT:  Head: Normocephalic and atraumatic.  Right Ear: External ear normal.  Left Ear: External ear normal.  Nose: Nose normal.  Mouth/Throat: Oropharynx is clear and moist.  Eyes: Conjunctivae are normal. Pupils are equal, round, and reactive to light.  Neck: No JVD present.  Cardiovascular: Normal rate, regular rhythm, normal heart sounds and intact distal pulses.   Pulmonary/Chest: Effort normal and breath sounds normal.  Abdominal: Soft. Bowel sounds are normal.  Neurological: He is alert. He is disoriented (dioreiented to time and place. oriented to person and situation.).  Skin: Skin is warm and dry.  Psychiatric: He has a normal mood and affect. He is slowed. Cognition and memory are impaired.  Nursing  note and vitals reviewed.  Assessment & Plan:   Problem List Items Addressed This Visit      Digestive   GERD (gastroesophageal reflux disease) - Primary   Relevant Medications   pantoprazole (PROTONIX) 40 MG tablet     Other   Vitamin B12 deficiency   Relevant Medications   vitamin B-12 (CYANOCOBALAMIN) 1000 MCG tablet    Other Visit Diagnoses    Mental confusion       Mini mental status examination given. Patient became frustrated and did not complete.   Urinanalysis ordered but  cancelled. Pt.can't void at this time.   Relevant Orders   Vitamin B12 (Completed)   CBC with Differential (Completed)   Basic metabolic panel (Completed)   Lipid screening       Relevant Orders   Lipid Panel (Completed)   Hospital discharge follow-up       Relevant Orders   Thyroid Panel With TSH   Vitamin D, 25-hydroxy (Completed)   PSA (Completed)   Medication refill       Relevant Medications   thiamine ([facility]S B-1) 100 MG tablet   folic acid (FOLVITE) 1 MG tablet   Multiple Vitamin (MULTIVITAMIN WITH MINERALS) TABS tablet      Meds ordered this encounter  Medications  . pantoprazole (PROTONIX) 40 MG tablet    Sig: Take 1 tablet (40 mg total) by mouth daily.    Dispense:  90 tablet    Refill:  3    Must have office visit for refills    Order Specific Question:   Supervising Provider    Answer:   Quentin Angst [id number]  . vitamin B-12 (CYANOCOBALAMIN) 1000 MCG tablet    Sig: Take 1 tablet (1,000 mcg total) by mouth daily.    Dispense:  90 tablet    Refill:  3    Order Specific Question:   Supervising Provider    Answer:   Quentin Angst [id number]  . aspirin 81 MG EC tablet    Sig: Take 1 tablet (81 mg total) by mouth daily.    Dispense:  90 tablet    Refill:  3    Order Specific Question:   Supervising Provider    Answer:   Quentin Angst [id number]  . thiamine (CVS B-1) 100 MG tablet    Sig: Take 1 tablet (100 mg total) by mouth daily.    Dispense:  100 tablet    Refill:  3    Order Specific Question:   Supervising Provider    Answer:   Quentin Angst [id number]  . folic acid (FOLVITE) 1 MG tablet    Sig: Take 1 tablet (1 mg total) by mouth daily.    Dispense:  90 tablet    Refill:  3    Order Specific Question:   Supervising Provider    Answer:   Quentin Angst [id number]  . Multiple Vitamin (MULTIVITAMIN WITH MINERALS) TABS tablet    Sig: Take 1 tablet by mouth daily.    Dispense:  60 tablet    Refill:  0    Order Specific  Question:   Supervising Provider    Answer:   Quentin Angst [id number]    Follow-up: Return in about 3 weeks (around 05/01/2017), or if symptoms worsen or fail to improve, for GERD/Vitamin B12.   Lizbeth Bark FNP

## 2017-04-10 NOTE — Patient Instructions (Signed)
Food Choices for Gastroesophageal Reflux Disease, Adult When you have gastroesophageal reflux disease (GERD), the foods you eat and your eating habits are very important. Choosing the right foods can help ease your discomfort. What guidelines do I need to follow?  Choose fruits, vegetables, whole grains, and low-fat dairy products.  Choose low-fat meat, fish, and poultry.  Limit fats such as oils, salad dressings, butter, nuts, and avocado.  Keep a food diary. This helps you identify foods that cause symptoms.  Avoid foods that cause symptoms. These may be different for everyone.  Eat small meals often instead of 3 large meals a day.  Eat your meals slowly, in a place where you are relaxed.  Limit fried foods.  Cook foods using methods other than frying.  Avoid drinking alcohol.  Avoid drinking large amounts of liquids with your meals.  Avoid bending over or lying down until 2-3 hours after eating. What foods are not recommended? These are some foods and drinks that may make your symptoms worse: Vegetables  Tomatoes. Tomato juice. Tomato and spaghetti sauce. Chili peppers. Onion and garlic. Horseradish. Fruits  Oranges, grapefruit, and lemon (fruit and juice). Meats  High-fat meats, fish, and poultry. This includes hot dogs, ribs, ham, sausage, salami, and bacon. Dairy  Whole milk and chocolate milk. Sour cream. Cream. Butter. Ice cream. Cream cheese. Drinks  Coffee and tea. Bubbly (carbonated) drinks or energy drinks. Condiments  Hot sauce. Barbecue sauce. Sweets/Desserts  Chocolate and cocoa. Donuts. Peppermint and spearmint. Fats and Oils  High-fat foods. This includes French fries and potato chips. Other  Vinegar. Strong spices. This includes black pepper, white pepper, red pepper, cayenne, curry powder, cloves, ginger, and chili powder. The items listed above may not be a complete list of foods and drinks to avoid. Contact your dietitian for more information.    This information is not intended to replace advice given to you by your health care provider. Make sure you discuss any questions you have with your health care provider. Document Released: 03/30/2012 Document Revised: 03/06/2016 Document Reviewed: 08/03/2013 Elsevier Interactive Patient Education  2017 Elsevier Inc.  

## 2017-04-11 LAB — BASIC METABOLIC PANEL
BUN/Creatinine Ratio: 11 (ref 10–24)
BUN: 18 mg/dL (ref 8–27)
CHLORIDE: 106 mmol/L (ref 96–106)
CO2: 25 mmol/L (ref 20–29)
Calcium: 9.2 mg/dL (ref 8.6–10.2)
Creatinine, Ser: 1.66 mg/dL — ABNORMAL HIGH (ref 0.76–1.27)
GFR calc Af Amer: 45 mL/min/{1.73_m2} — ABNORMAL LOW (ref >59)
GFR, EST NON AFRICAN AMERICAN: 39 mL/min/{1.73_m2} — AB (ref >59)
Glucose: 86 mg/dL (ref 65–99)
POTASSIUM: 4.1 mmol/L (ref 3.5–5.2)
Sodium: 143 mmol/L (ref 134–144)

## 2017-04-11 LAB — LIPID PANEL
CHOLESTEROL TOTAL: 193 mg/dL (ref 100–199)
Chol/HDL Ratio: 2.8 ratio (ref 0.0–5.0)
HDL: 68 mg/dL (ref >39)
LDL Calculated: 112 mg/dL — ABNORMAL HIGH (ref 0–99)
Triglycerides: 67 mg/dL (ref 0–149)
VLDL Cholesterol Cal: 13 mg/dL (ref 5–40)

## 2017-04-11 LAB — CBC WITH DIFFERENTIAL/PLATELET
Basophils Absolute: 0 10*3/uL (ref 0.0–0.2)
Basos: 0 %
EOS (ABSOLUTE): 0.1 10*3/uL (ref 0.0–0.4)
EOS: 1 %
HEMATOCRIT: 32.4 % — AB (ref 37.5–51.0)
Hemoglobin: 10.9 g/dL — ABNORMAL LOW (ref 13.0–17.7)
IMMATURE GRANS (ABS): 0 10*3/uL (ref 0.0–0.1)
IMMATURE GRANULOCYTES: 0 %
LYMPHS: 27 %
Lymphocytes Absolute: 1.7 10*3/uL (ref 0.7–3.1)
MCH: 29 pg (ref 26.6–33.0)
MCHC: 33.6 g/dL (ref 31.5–35.7)
MCV: 86 fL (ref 79–97)
MONOS ABS: 0.7 10*3/uL (ref 0.1–0.9)
Monocytes: 11 %
NEUTROS PCT: 61 %
Neutrophils Absolute: 3.8 10*3/uL (ref 1.4–7.0)
PLATELETS: 314 10*3/uL (ref 150–379)
RBC: 3.76 x10E6/uL — AB (ref 4.14–5.80)
RDW: 16.1 % — AB (ref 12.3–15.4)
WBC: 6.3 10*3/uL (ref 3.4–10.8)

## 2017-04-11 LAB — VITAMIN D 25 HYDROXY (VIT D DEFICIENCY, FRACTURES): Vit D, 25-Hydroxy: 25.7 ng/mL — ABNORMAL LOW (ref 30.0–100.0)

## 2017-04-11 LAB — PSA: Prostate Specific Ag, Serum: 1.4 ng/mL (ref 0.0–4.0)

## 2017-04-11 LAB — VITAMIN B12: Vitamin B-12: 290 pg/mL (ref 232–1245)

## 2017-04-16 DIAGNOSIS — R3129 Other microscopic hematuria: Secondary | ICD-10-CM | POA: Diagnosis not present

## 2017-04-22 ENCOUNTER — Other Ambulatory Visit: Payer: Self-pay | Admitting: Family Medicine

## 2017-04-22 ENCOUNTER — Telehealth: Payer: Self-pay

## 2017-04-22 DIAGNOSIS — R410 Disorientation, unspecified: Secondary | ICD-10-CM

## 2017-04-22 DIAGNOSIS — E559 Vitamin D deficiency, unspecified: Secondary | ICD-10-CM

## 2017-04-22 DIAGNOSIS — Z9189 Other specified personal risk factors, not elsewhere classified: Secondary | ICD-10-CM

## 2017-04-22 MED ORDER — VITAMIN D (ERGOCALCIFEROL) 1.25 MG (50000 UNIT) PO CAPS: 50000.0000 [IU] | ORAL_CAPSULE | ORAL | 0 refills | Status: AC

## 2017-04-22 MED ORDER — PRAVASTATIN SODIUM 20 MG PO TABS
20.0000 mg | ORAL_TABLET | Freq: Every day | ORAL | 0 refills | Status: DC
Start: 2017-04-22 — End: 2017-05-06

## 2017-04-22 NOTE — Telephone Encounter (Signed)
CMA call regarding lab results  Patient verify DOB  Patient stated that he was already inform about his results

## 2017-04-22 NOTE — Telephone Encounter (Signed)
-----   Message from Lizbeth BarkMandesia R Hairston, FNP sent at 04/22/2017  8:38 AM EDT ----- -Kidney function is stable since last check but is still decreased. Levels indicate you have chronic kidney disease stage 3.  -Continue to take your medications for blood pressure, avoid taking NSAID medications, reduce salt intake to 2 to 4 grams/day, do not smoke. Recommend monitoring again in 6 months.  -Lipid levels were elevated. This can increase your risk of heart disease. You will be prescribed pravastatin to help lower risk. Recommend follow up in 3 months.  -Vitamin B12 normal. When decreased it can cause confusion or neurological symptoms.  -PSA is normal. PSA is screens for prostate problems.  -Vitamin D level has improved but is still low. You were prescribed ergocalciferol (capsules) to increase your vitamin-d level. -Thyroid test could not be resulted. Recommend scheduling walk in appt. With lab to check levels. -You will be referred neurology for further evaluation for mental confusion.

## 2017-04-24 ENCOUNTER — Encounter: Payer: Self-pay | Admitting: Neurology

## 2017-05-01 ENCOUNTER — Ambulatory Visit: Payer: Self-pay | Admitting: Family Medicine

## 2017-05-06 ENCOUNTER — Encounter: Payer: Self-pay | Admitting: Family Medicine

## 2017-05-06 ENCOUNTER — Ambulatory Visit: Payer: Medicare Other | Attending: Family Medicine | Admitting: Family Medicine

## 2017-05-06 VITALS — BP 147/77 | HR 71 | Temp 97.9°F | Resp 18 | Ht 71.0 in | Wt 130.4 lb

## 2017-05-06 DIAGNOSIS — Z9189 Other specified personal risk factors, not elsewhere classified: Secondary | ICD-10-CM

## 2017-05-06 DIAGNOSIS — R030 Elevated blood-pressure reading, without diagnosis of hypertension: Secondary | ICD-10-CM | POA: Diagnosis not present

## 2017-05-06 DIAGNOSIS — R7989 Other specified abnormal findings of blood chemistry: Secondary | ICD-10-CM

## 2017-05-06 DIAGNOSIS — Z7982 Long term (current) use of aspirin: Secondary | ICD-10-CM | POA: Diagnosis not present

## 2017-05-06 DIAGNOSIS — K219 Gastro-esophageal reflux disease without esophagitis: Secondary | ICD-10-CM | POA: Insufficient documentation

## 2017-05-06 DIAGNOSIS — Z760 Encounter for issue of repeat prescription: Secondary | ICD-10-CM

## 2017-05-06 DIAGNOSIS — E538 Deficiency of other specified B group vitamins: Secondary | ICD-10-CM | POA: Diagnosis not present

## 2017-05-06 DIAGNOSIS — Z79899 Other long term (current) drug therapy: Secondary | ICD-10-CM | POA: Insufficient documentation

## 2017-05-06 DIAGNOSIS — Z09 Encounter for follow-up examination after completed treatment for conditions other than malignant neoplasm: Secondary | ICD-10-CM

## 2017-05-06 DIAGNOSIS — R946 Abnormal results of thyroid function studies: Secondary | ICD-10-CM

## 2017-05-06 MED ORDER — PRAVASTATIN SODIUM 20 MG PO TABS: 20.0000 mg | ORAL_TABLET | Freq: Every day | ORAL | 2 refills | Status: DC

## 2017-05-06 NOTE — Progress Notes (Signed)
Subjective:  Patient ID: Brandon BreedHarry L Taylor, male    DOB: 04/08/1941  Age: 76 y.o. MRN: [id number]  CC: Follow-up   HPI Brandon Taylor presents for follow up. PMH of GERD, ETOH abuse, and vitamin B12 deficiency. He is accompanied by his wife.  He reports taking protonix for symptoms with adequate relief of symptoms. He report discontinuing alcohol use 3 years ago. History of ED visit in May 2018 , TSH was found to be elevated. It was recommended his thyroid levels be check again at follow up. History of memory confusion. His wife reports patient has upcoming neurology appointment in August. BP found in office to be elevated in office. He deneis any history of hypertension. Patient's blood pressure found to elevated in office today. Denies any past medical history of hypertension. Cardiac symptoms none. Patient denies chest pain, dyspnea, lower extremity edema and palpitations.  Cardiovascular risk factors: advanced age (older than 355 for men, 6765 for women), male gender and sedentary lifestyle. Use of agents associated with hypertension: none. History of target organ damage: chronic kidney disease.    Outpatient Medications Prior to Visit  Medication Sig Dispense Refill  . aspirin 81 MG EC tablet Take 1 tablet (81 mg total) by mouth daily. 90 tablet 3  . feeding supplement, ENSURE ENLIVE, (ENSURE ENLIVE) LIQD Take 237 mLs by mouth 2 (two) times daily between meals. 237 mL 12  . folic acid (FOLVITE) 1 MG tablet Take 1 tablet (1 mg total) by mouth daily. 90 tablet 3  . Multiple Vitamin (MULTIVITAMIN WITH MINERALS) TABS tablet Take 1 tablet by mouth daily. 60 tablet 0  . pantoprazole (PROTONIX) 40 MG tablet Take 1 tablet (40 mg total) by mouth daily. 90 tablet 3  . senna-docusate (SENOKOT-S) 8.6-50 MG tablet Take 1 tablet by mouth at bedtime as needed for mild constipation. 30 tablet 0  . thiamine ([facility]S B-1) 100 MG tablet Take 1 tablet (100 mg total) by mouth daily. 100 tablet 3  . vitamin B-12  (CYANOCOBALAMIN) 1000 MCG tablet Take 1 tablet (1,000 mcg total) by mouth daily. 90 tablet 3  . Vitamin D, Ergocalciferol, (DRISDOL) 50000 units CAPS capsule Take 1 capsule (50,000 Units total) by mouth every 7 (seven) days. 8 capsule 0  . pravastatin (PRAVACHOL) 20 MG tablet Take 1 tablet (20 mg total) by mouth daily. 90 tablet 0   No facility-administered medications prior to visit.     ROS Review of Systems  Constitutional: Negative.   Eyes: Negative.   Respiratory: Negative.   Cardiovascular: Negative.   Gastrointestinal: Negative.   Skin: Negative.   Psychiatric/Behavioral: Negative.    Objective:  BP (!) 147/77   Pulse 71   Temp 97.9 F (36.6 C) (Oral)   Resp 18   Ht 5\' 11"  (1.803 m)   Wt 130 lb 6.4 oz (59.1 kg)   SpO2 98%   BMI 18.19 kg/m   BP/Weight 05/06/2017 04/10/2017 02/18/2017  Systolic BP 147 122 139  Diastolic BP 77 67 61  Wt. (Lbs) 130.4 129 -  BMI 18.19 18.25 -   Physical Exam  Constitutional: He appears well-developed and well-nourished.  HENT:  Head: Normocephalic and atraumatic.  Right Ear: External ear normal.  Left Ear: External ear normal.  Nose: Nose normal.  Mouth/Throat: Oropharynx is clear and moist.  Neck: No JVD present. No thyromegaly present.  Cardiovascular: Normal rate, regular rhythm, normal heart sounds and intact distal pulses.   Pulmonary/Chest: Effort normal and breath sounds normal.  Abdominal: Soft.  Bowel sounds are normal.  Neurological: He is alert. He is disoriented (disoriented to time and place. oriented to person and situation.).  Skin: Skin is warm and dry.  Psychiatric: He has a normal mood and affect. He is slowed. Cognition and memory are impaired.  Nursing note and vitals reviewed.   Assessment & Plan:   Problem List Items Addressed This Visit    None    Visit Diagnoses    Follow up    -  Primary   Elevated TSH    Elevated BP during this office visit. Schedule BP recheck in 1 week with clinic RN.   If BP is  greater than 90/60 (MAP 65 or greater) but not less than 130/80 may add dose of losartan 25 mg    QD dose of and recheck in another 2 weeks with clinic RN.   Elevated TSH       Relevant Orders   Thyroid Panel With TSH   Medication refill       Candidate for statin therapy due to risk of future cardiovascular event       Relevant Medications   pravastatin (PRAVACHOL) 20 MG tablet      Meds ordered this encounter  Medications  . pravastatin (PRAVACHOL) 20 MG tablet    Sig: Take 1 tablet (20 mg total) by mouth daily.    Dispense:  30 tablet    Refill:  2    Order Specific Question:   Supervising Provider    Answer:   Quentin AngstJEGEDE, OLUGBEMIGA E [id number]    Follow-up: Return in about 1 week (around 05/13/2017) for BP check Travia .   Lizbeth BarkMandesia R Yanina Knupp FNP

## 2017-05-06 NOTE — Patient Instructions (Addendum)
How to Take Your Blood Pressure You can take your blood pressure at home with a machine. You may need to check your blood pressure at home:  To check if you have high blood pressure (hypertension).  To check your blood pressure over time.  To make sure your blood pressure medicine is working.  Supplies needed: You will need a blood pressure machine, or monitor. You can buy one at a drugstore or online. When choosing one:  Choose one with an arm cuff.  Choose one that wraps around your upper arm. Only one finger should fit between your arm and the cuff.  Do not choose one that measures your blood pressure from your wrist or finger.  Your doctor can suggest a monitor. How to prepare Avoid these things for 30 minutes before checking your blood pressure:  Drinking caffeine.  Drinking alcohol.  Eating.  Smoking.  Exercising.  Five minutes before checking your blood pressure:  Pee.  Sit in a dining chair. Avoid sitting in a soft couch or armchair.  Be quiet. Do not talk.  How to take your blood pressure Follow the instructions that came with your machine. If you have a digital blood pressure monitor, these may be the instructions: 1. Sit up straight. 2. Place your feet on the floor. Do not cross your ankles or legs. 3. Rest your left arm at the level of your heart. You may rest it on a table, desk, or chair. 4. Pull up your shirt sleeve. 5. Wrap the blood pressure cuff around the upper part of your left arm. The cuff should be 1 inch (2.5 cm) above your elbow. It is best to wrap the cuff around bare skin. 6. Fit the cuff snugly around your arm. You should be able to place only one finger between the cuff and your arm. 7. Put the cord inside the groove of your elbow. 8. Press the power button. 9. Sit quietly while the cuff fills with air and loses air. 10. Write down the numbers on the screen. 11. Wait 2-3 minutes and then repeat steps 1-10.  What do the numbers  mean? Two numbers make up your blood pressure. The first number is called systolic pressure. The second is called diastolic pressure. An example of a blood pressure reading is "120 over 80" (or 120/80). If you are an adult and do not have a medical condition, use this guide to find out if your blood pressure is normal: Normal  First number: below 120.  Second number: below 80. Elevated  First number: 120-129.  Second number: below 80. Hypertension stage 1  First number: 130-139.  Second number: 80-89. Hypertension stage 2  First number: 140 or above.  Second number: 90 or above. Your blood pressure is above normal even if only the top or bottom number is above normal. Follow these instructions at home:  Check your blood pressure as often as your doctor tells you to.  Take your monitor to your next doctor's appointment. Your doctor will: ? Make sure you are using it correctly. ? Make sure it is working right.  Make sure you understand what your blood pressure numbers should be.  Tell your doctor if your medicines are causing side effects. Contact a doctor if:  Your blood pressure keeps being high. Get help right away if:  Your first blood pressure number is higher than 180.  Your second blood pressure number is higher than 120. This information is not intended to replace advice given   to you by your health care provider. Make sure you discuss any questions you have with your health care provider. Document Released: 09/11/2008 Document Revised: 08/27/2016 Document Reviewed: 03/07/2016 Elsevier Interactive Patient Education  2018 Elsevier Inc.  

## 2017-05-07 LAB — THYROID PANEL WITH TSH
Free Thyroxine Index: 1.9 (ref 1.2–4.9)
T3 Uptake Ratio: 24 % (ref 24–39)
T4 TOTAL: 8 ug/dL (ref 4.5–12.0)
TSH: 1.08 u[IU]/mL (ref 0.450–4.500)

## 2017-05-12 ENCOUNTER — Telehealth: Payer: Self-pay

## 2017-05-12 NOTE — Telephone Encounter (Signed)
-----   Message from Lizbeth BarkMandesia R Hairston, FNP sent at 05/12/2017  8:37 AM EDT ----- Thyroid function normal. No hyperthyroidism present.

## 2017-05-12 NOTE — Telephone Encounter (Signed)
CMA call regarding lab results   Patient verify DOB  Patient was aware and understood  

## 2017-05-13 ENCOUNTER — Encounter: Payer: Self-pay | Admitting: Pharmacist

## 2017-05-13 ENCOUNTER — Ambulatory Visit: Payer: Medicare Other | Attending: Family Medicine | Admitting: Pharmacist

## 2017-05-13 VITALS — BP 133/76 | HR 80

## 2017-05-13 DIAGNOSIS — R030 Elevated blood-pressure reading, without diagnosis of hypertension: Secondary | ICD-10-CM | POA: Diagnosis not present

## 2017-05-13 DIAGNOSIS — Z0000 Encounter for general adult medical examination without abnormal findings: Secondary | ICD-10-CM

## 2017-05-13 DIAGNOSIS — Z0130 Encounter for examination of blood pressure without abnormal findings: Secondary | ICD-10-CM | POA: Diagnosis not present

## 2017-05-13 NOTE — Progress Notes (Signed)
   S:    Patient arrives in good spirits.    Presents to the clinic for blood pressure evaluation. Patient was referred on 05/06/17.  Patient was last seen by Primary Care Provider on 05/06/17.   Current BP Medications include:  none  Antihypertensives tried in the past include: none   O:   Last 3 Office BP readings: BP Readings from Last 3 Encounters:  05/13/17 133/76  05/06/17 (!) 147/77  04/10/17 122/67    BMET    Component Value Date/Time   NA 143 04/10/2017 1708   K 4.1 04/10/2017 1708   CL 106 04/10/2017 1708   CO2 25 04/10/2017 1708   GLUCOSE 86 04/10/2017 1708   GLUCOSE 133 (H) 02/18/2017 1301   BUN 18 04/10/2017 1708   CREATININE 1.66 (H) 04/10/2017 1708   CREATININE 1.19 10/24/2014 1114   CALCIUM 9.2 04/10/2017 1708   GFRNONAA 39 (L) 04/10/2017 1708   GFRNONAA 60 10/24/2014 1114   GFRAA 45 (L) 04/10/2017 1708   GFRAA 69 10/24/2014 1114    A/P: Recent history of elevated blood pressure readings currently 133/76 on no medications. No changes to current medications. Patient to follow up with PCP.  Results reviewed and written information provided.   Total time in face-to-face counseling 5 minutes.   [facility]

## 2017-05-13 NOTE — Patient Instructions (Addendum)
Thanks for coming to see me  Your blood pressure was good today, we want to try to keep it less than 140/90 but nothing higher than 150/90.  Follow up with Brandon Taylor in October or sooner if you need us!

## 2017-05-29 ENCOUNTER — Ambulatory Visit (INDEPENDENT_AMBULATORY_CARE_PROVIDER_SITE_OTHER): Payer: Medicare Other | Admitting: Neurology

## 2017-05-29 ENCOUNTER — Ambulatory Visit
Admission: RE | Admit: 2017-05-29 | Discharge: 2017-05-29 | Disposition: A | Discharge status: Home / Self Care | Payer: Medicare Other | Source: Ambulatory Visit | Attending: Neurology | Admitting: Neurology

## 2017-05-29 ENCOUNTER — Encounter: Payer: Self-pay | Admitting: Neurology

## 2017-05-29 VITALS — BP 164/66 | HR 72 | Ht 70.0 in | Wt 132.0 lb

## 2017-05-29 DIAGNOSIS — F0390 Unspecified dementia without behavioral disturbance: Secondary | ICD-10-CM

## 2017-05-29 DIAGNOSIS — F03A0 Unspecified dementia, mild, without behavioral disturbance, psychotic disturbance, mood disturbance, and anxiety: Secondary | ICD-10-CM

## 2017-05-29 DIAGNOSIS — R410 Disorientation, unspecified: Secondary | ICD-10-CM | POA: Diagnosis not present

## 2017-05-29 NOTE — Patient Instructions (Addendum)
1. Schedule head CT without contrast  We have sent a referral to [facility] for your CT ScanI and they will call you directly to schedule your appt. They are located at [address] [facility]. If you need to contact them directly please call [phone].   2. Continue all your medications 3. Follow-up in 1 year, call for any changes

## 2017-05-29 NOTE — Progress Notes (Signed)
NEUROLOGY CONSULTATION NOTE  Brandon Taylor MRN: [id number] DOB: 1941-01-27  Referring provider: Arrie Senate, FNP Primary care provider: Arrie Senate, FNP  Reason for consult:  Mental confusion  Thank you for your kind referral of Brandon Taylor for consultation of the above symptoms. Although his history is well known to you, please allow me to reiterate it for the purpose of our medical record. The patient was accompanied to the clinic by his wife who also provides collateral information. Records and images were personally reviewed where available.  HISTORY OF PRESENT ILLNESS: This is a 76 year old right-handed man with a history of alcohol abuse (stopped in 2014), presenting for evaluation of mental confusion. He feels his memory "sometimes does good, sometimes it don't." His wife started noticing changes around 5 years ago, but states "it's just age," she states "its' not like he does not know where he is, he does not pay attention if it doesn't concern him." She reports he does not repeat himself, then later on tells me he is not good with dates, she had to tell him his birthday repeatedly and he is just now getting that. He has never kept up with doctor appointments, she would manage this for him. She manages finances. She reports he is good with taking his medications. He stopped driving years ago. No difficulties with ADLs. He has difficulties following complicated instructions. He gets more irritable, but no significant personality changes, paranoia, or hallucinations. His wife denies any confusion, stating he is very alert. When asked why he is here today, they report that "he does not want to get questioned," and that when he was unable to perform MMSE at PCP office ("made me mad"), he was referred for evaluation.  They deny any family history of dementia, no significant head injuries. He drank heavily ("beer, liquor, wine, all of them") until 2014. His wife has noticed his  speech has been more nasal for the past year, she feels after he stopped smoking. He reports some difficulties swallowing liquids and had 2 swallow tests that were normal. He has occasional numbness in his calves, "they swell up." He denies any headaches, dizziness, diplopia, dysarthria, neck/back pain, bowel/bladder dysfunction, anosmia, or tremors.   Laboratory Data: Lab Results  Component Value Date   VITAMINB12 290 04/10/2017   Lab Results  Component Value Date   TSH 1.080 05/06/2017    PAST MEDICAL HISTORY: Past Medical History:  Diagnosis Date  . ETOH abuse   . GERD (gastroesophageal reflux disease)     PAST SURGICAL HISTORY: Past Surgical History:  Procedure Laterality Date  . NO PAST SURGERIES      MEDICATIONS: Current Outpatient Prescriptions on File Prior to Visit  Medication Sig Dispense Refill  . aspirin 81 MG EC tablet Take 1 tablet (81 mg total) by mouth daily. 90 tablet 3  . feeding supplement, ENSURE ENLIVE, (ENSURE ENLIVE) LIQD Take 237 mLs by mouth 2 (two) times daily between meals. 237 mL 12  . folic acid (FOLVITE) 1 MG tablet Take 1 tablet (1 mg total) by mouth daily. 90 tablet 3  . Multiple Vitamin (MULTIVITAMIN WITH MINERALS) TABS tablet Take 1 tablet by mouth daily. 60 tablet 0  . pantoprazole (PROTONIX) 40 MG tablet Take 1 tablet (40 mg total) by mouth daily. 90 tablet 3  . pravastatin (PRAVACHOL) 20 MG tablet Take 1 tablet (20 mg total) by mouth daily. 30 tablet 2  . senna-docusate (SENOKOT-S) 8.6-50 MG tablet Take 1 tablet by mouth  at bedtime as needed for mild constipation. 30 tablet 0  . thiamine (CVS B-1) 100 MG tablet Take 1 tablet (100 mg total) by mouth daily. 100 tablet 3  . vitamin B-12 (CYANOCOBALAMIN) 1000 MCG tablet Take 1 tablet (1,000 mcg total) by mouth daily. 90 tablet 3  . Vitamin D, Ergocalciferol, (DRISDOL) 50000 units CAPS capsule Take 1 capsule (50,000 Units total) by mouth every 7 (seven) days. 8 capsule 0   No current  facility-administered medications on file prior to visit.     ALLERGIES: No Known Allergies  FAMILY HISTORY: Family History  Problem Relation Age of Onset  . COPD Brother     SOCIAL HISTORY: Social History   Social History  . Marital status: Married    Spouse name: N/A  . Number of children: N/A  . Years of education: N/A   Occupational History  . Not on file.   Social History Main Topics  . Smoking status: Former Smoker    Packs/day: 2.00    Years: 40.00    Quit date: 10/13/2012  . Smokeless tobacco: Never Used  . Alcohol use No     Comment: fifth daily  . Drug use: No  . Sexual activity: Not on file   Other Topics Concern  . Not on file   Social History Narrative  . No narrative on file    REVIEW OF SYSTEMS: Constitutional: No fevers, chills, or sweats, no generalized fatigue, change in appetite Eyes: No visual changes, double vision, eye pain Ear, nose and throat: No hearing loss, ear pain, nasal congestion, sore throat Cardiovascular: No chest pain, palpitations Respiratory:  No shortness of breath at rest or with exertion, wheezes GastrointestinaI: No nausea, vomiting, diarrhea, abdominal pain, fecal incontinence Genitourinary:  No dysuria, urinary retention or frequency Musculoskeletal:  No neck pain, back pain Integumentary: No rash, pruritus, skin lesions Neurological: as above Psychiatric: No depression, insomnia, anxiety Endocrine: No palpitations, fatigue, diaphoresis, mood swings, change in appetite, change in weight, increased thirst Hematologic/Lymphatic:  No anemia, purpura, petechiae. Allergic/Immunologic: no itchy/runny eyes, nasal congestion, recent allergic reactions, rashes  PHYSICAL EXAM: Vitals:   05/29/17 0857  BP: (!) 164/66  Pulse: 72  SpO2: 98%   General: No acute distress, speaks in a soft voice, some flat affect Head:  Normocephalic/atraumatic Eyes: Fundoscopic exam shows bilateral sharp discs, no vessel changes, exudates,  or hemorrhages Neck: supple, no paraspinal tenderness, full range of motion Back: No paraspinal tenderness Heart: regular rate and rhythm Lungs: Clear to auscultation bilaterally. Vascular: No carotid bruits. Skin/Extremities: No rash, no edema Neurological Exam: Mental status: alert and oriented to person, place, day of week/season. States it is 2008. No dysarthria or aphasia, Fund of knowledge is appropriate.  Recent and remote memory are intact.  Attention and concentration are normal. His wife reports there is "a little bit of illiteracy," he states he cannot spell WORLD or do serial 7s. He refused to name the months of the year.  Able to name objects and repeat phrases. CDT 1/5 (numbers were put counterclockwise) MMSE - Mini Mental State Exam 05/29/2017 04/10/2017  Not completed: - Unable to complete  Orientation to time 2 2  Orientation to Place 4 4  Registration 3 3  Attention/ Calculation - 0  Recall 2 2  Language- name 2 objects 2 2  Language- repeat 1 1  Language- follow 3 step command 3 2  Language- read & follow direction - -  Write a sentence - -  Copy design 0 -  Total score 17/28 (unable to read/write) -   Cranial nerves: CN I: not tested CN II: pupils equal, round and reactive to light, visual fields intact, fundi unremarkable. CN III, IV, VI:  full range of motion, no nystagmus, no ptosis CN V: facial sensation intact CN VII: upper and lower face symmetric CN VIII: hearing intact to finger rub CN IX, X: gag intact, uvula midline CN XI: sternocleidomastoid and trapezius muscles intact CN XII: tongue midline Bulk & Tone: normal, no fasciculations. Motor: 5/5 throughout with no pronator drift. Sensation: intact to light touch, cold, pin, vibration and joint position sense.  No extinction to double simultaneous stimulation.  Romberg test negative Deep Tendon Reflexes: +1 throughout, no ankle clonus Plantar responses: downgoing bilaterally Cerebellar: no  incoordination on finger to nose, heel to shin. No dysdiadochokinesia Gait: narrow-based and steady, able to tandem walk adequately. Tremor: none  IMPRESSION: This is a 76 year old right-handed man with a history of alcohol abuse (none since 2014) presenting for evaluation of memory changes. He and his wife feel symptoms are more age-related and report he has been referred to our office because he refused to do MMSE testing. Exam today shows MMSE of 17/28 (illiterate per wife), symptoms suggestive of mild dementia. He agrees to do a head CT without contrast to assess for underlying structural abnormality. We discussed starting Aricept, including side effects and expectations from the medication. He refuses to start the medication and his wife supports this. We discussed the importance of control of vascular risk factors, physical exercise, and brain stimulation exercises for brain health. He agrees to follow-up in 1 year and knows to call for any changes.   Thank you for allowing me to participate in the care of this patient. Please do not hesitate to call for any questions or concerns.   Patrcia Dolly, M.D.  CC: Arrie Senate, FNP

## 2017-06-01 ENCOUNTER — Telehealth: Payer: Self-pay

## 2017-06-01 NOTE — Telephone Encounter (Signed)
Called pt.  A male answered the phone and  either hung up or the call was lost.  Will try again later.

## 2017-06-01 NOTE — Telephone Encounter (Signed)
-----   Message from Karen M Aquino, MD sent at 06/01/2017  8:59 AM EDT ----- Pls let patient/wife know the head CT did not show any evidence of tumor, stroke, or bleed. It showed age-related changes. thanks 

## 2017-06-02 ENCOUNTER — Telehealth: Payer: Self-pay

## 2017-06-02 DIAGNOSIS — R3129 Other microscopic hematuria: Secondary | ICD-10-CM | POA: Diagnosis not present

## 2017-06-02 NOTE — Telephone Encounter (Signed)
Called pt.  Seems as though phone was answered, but no one stated anything.  Line disconnected after 15 seconds.

## 2017-06-02 NOTE — Telephone Encounter (Signed)
-----   Message from Van Clines, MD sent at 06/01/2017  8:59 AM EDT ----- Pls let patient/wife know the head CT did not show any evidence of tumor, stroke, or bleed. It showed age-related changes. thanks

## 2017-06-23 ENCOUNTER — Other Ambulatory Visit: Payer: Self-pay | Admitting: Family Medicine

## 2017-06-23 DIAGNOSIS — E559 Vitamin D deficiency, unspecified: Secondary | ICD-10-CM

## 2017-07-07 DIAGNOSIS — Z961 Presence of intraocular lens: Secondary | ICD-10-CM | POA: Diagnosis not present

## 2017-07-07 DIAGNOSIS — H401232 Low-tension glaucoma, bilateral, moderate stage: Secondary | ICD-10-CM | POA: Diagnosis not present

## 2017-07-13 ENCOUNTER — Encounter: Payer: Self-pay | Admitting: Family Medicine

## 2017-07-13 ENCOUNTER — Ambulatory Visit: Payer: Medicare Other | Attending: Family Medicine | Admitting: Family Medicine

## 2017-07-13 VITALS — BP 183/91 | HR 69 | Temp 97.5°F | Resp 18 | Ht 71.0 in | Wt 131.8 lb

## 2017-07-13 DIAGNOSIS — F0390 Unspecified dementia without behavioral disturbance: Secondary | ICD-10-CM | POA: Diagnosis not present

## 2017-07-13 DIAGNOSIS — Z23 Encounter for immunization: Secondary | ICD-10-CM | POA: Diagnosis not present

## 2017-07-13 DIAGNOSIS — N189 Chronic kidney disease, unspecified: Secondary | ICD-10-CM | POA: Insufficient documentation

## 2017-07-13 DIAGNOSIS — Z79899 Other long term (current) drug therapy: Secondary | ICD-10-CM | POA: Insufficient documentation

## 2017-07-13 DIAGNOSIS — Z8659 Personal history of other mental and behavioral disorders: Secondary | ICD-10-CM

## 2017-07-13 DIAGNOSIS — I10 Essential (primary) hypertension: Secondary | ICD-10-CM | POA: Diagnosis not present

## 2017-07-13 DIAGNOSIS — I129 Hypertensive chronic kidney disease with stage 1 through stage 4 chronic kidney disease, or unspecified chronic kidney disease: Secondary | ICD-10-CM | POA: Insufficient documentation

## 2017-07-13 DIAGNOSIS — E538 Deficiency of other specified B group vitamins: Secondary | ICD-10-CM | POA: Diagnosis not present

## 2017-07-13 DIAGNOSIS — Z7982 Long term (current) use of aspirin: Secondary | ICD-10-CM | POA: Insufficient documentation

## 2017-07-13 DIAGNOSIS — K219 Gastro-esophageal reflux disease without esophagitis: Secondary | ICD-10-CM | POA: Insufficient documentation

## 2017-07-13 MED ORDER — HYDROCHLOROTHIAZIDE 12.5 MG PO TABS: 12.5000 mg | ORAL_TABLET | Freq: Every day | ORAL | 2 refills | Status: DC

## 2017-07-13 MED ORDER — AMLODIPINE BESYLATE 5 MG PO TABS: 5.0000 mg | ORAL_TABLET | Freq: Every day | ORAL | 2 refills | Status: DC

## 2017-07-13 MED ORDER — PNEUMOCOCCAL 13-VAL CONJ VACC IM SUSP
0.5000 mL | Freq: Once | INTRAMUSCULAR | Status: AC
  Administered 2017-07-13: 0.5 mL via INTRAMUSCULAR

## 2017-07-13 MED ORDER — ZOSTER VAC RECOMB ADJUVANTED 50 MCG/0.5ML IM SUSR: 0.5000 mL | Freq: Once | INTRAMUSCULAR | 0 refills | Status: AC

## 2017-07-13 NOTE — Patient Instructions (Addendum)
[facility] [address], [place], [place] [address] [phone]    Hypertension Hypertension is another name for high blood pressure. High blood pressure forces your heart to work harder to pump blood. This can cause problems over time. There are two numbers in a blood pressure reading. There is a top number (systolic) over a bottom number (diastolic). It is best to have a blood pressure below 120/80. Healthy choices can help lower your blood pressure. You may need medicine to help lower your blood pressure if:  Your blood pressure cannot be lowered with healthy choices.  Your blood pressure is higher than 130/80.  Follow these instructions at home: Eating and drinking  If directed, follow the DASH eating plan. This diet includes: ? Filling half of your plate at each meal with fruits and vegetables. ? Filling one quarter of your plate at each meal with whole grains. Whole grains include whole wheat pasta, brown rice, and whole grain bread. ? Eating or drinking low-fat dairy products, such as skim milk or low-fat yogurt. ? Filling one quarter of your plate at each meal with low-fat (lean) proteins. Low-fat proteins include fish, skinless chicken, eggs, beans, and tofu. ? Avoiding fatty meat, cured and processed meat, or chicken with skin. ? Avoiding premade or processed food.  Eat less than 1,500 mg of salt (sodium) a day.  Limit alcohol use to no more than 1 drink a day for nonpregnant women and 2 drinks a day for men. One drink equals 12 oz of beer, 5 oz of wine, or 1 oz of hard liquor. Lifestyle  Work with your doctor to stay at a healthy weight or to lose weight. Ask your doctor what the best weight is for you.  Get at least 30 minutes of exercise that causes your heart to beat faster (aerobic exercise) most days of the week. This may include walking, swimming, or biking.  Get at least 30 minutes of exercise that strengthens your muscles (resistance  exercise) at least 3 days a week. This may include lifting weights or pilates.  Do not use any products that contain nicotine or tobacco. This includes cigarettes and e-cigarettes. If you need help quitting, ask your doctor.  Check your blood pressure at home as told by your doctor.  Keep all follow-up visits as told by your doctor. This is important. Medicines  Take over-the-counter and prescription medicines only as told by your doctor. Follow directions carefully.  Do not skip doses of blood pressure medicine. The medicine does not work as well if you skip doses. Skipping doses also puts you at risk for problems.  Ask your doctor about side effects or reactions to medicines that you should watch for. Contact a doctor if:  You think you are having a reaction to the medicine you are taking.  You have headaches that keep coming back (recurring).  You feel dizzy.  You have swelling in your ankles.  You have trouble with your vision. Get help right away if:  You get a very bad headache.  You start to feel confused.  You feel weak or numb.  You feel faint.  You get very bad pain in your: ? Chest. ? Belly (abdomen).  You throw up (vomit) more than once.  You have trouble breathing. Summary  Hypertension is another name for high blood pressure.  Making healthy choices can help lower blood pressure. If your blood pressure cannot be controlled with healthy choices, you may need to take  medicine. This information is not intended to replace advice given to you by your health care provider. Make sure you discuss any questions you have with your health care provider. Document Released: 03/17/2008 Document Revised: 08/27/2016 Document Reviewed: 08/27/2016 Elsevier Interactive Patient Education  2018 [employer].   DASH Eating Plan DASH stands for "Dietary Approaches to Stop Hypertension." The DASH eating plan is a healthy eating plan that has been shown to reduce high blood  pressure (hypertension). It may also reduce your risk for type 2 diabetes, heart disease, and stroke. The DASH eating plan may also help with weight loss. What are tips for following this plan? General guidelines  Avoid eating more than 2,300 mg (milligrams) of salt (sodium) a day. If you have hypertension, you may need to reduce your sodium intake to 1,500 mg a day.  Limit alcohol intake to no more than 1 drink a day for nonpregnant women and 2 drinks a day for men. One drink equals 12 oz of beer, 5 oz of wine, or 1 oz of hard liquor.  Work with your health care provider to maintain a healthy body weight or to lose weight. Ask what an ideal weight is for you.  Get at least 30 minutes of exercise that causes your heart to beat faster (aerobic exercise) most days of the week. Activities may include walking, swimming, or biking.  Work with your health care provider or diet and nutrition specialist (dietitian) to adjust your eating plan to your individual calorie needs. Reading food labels  Check food labels for the amount of sodium per serving. Choose foods with less than 5 percent of the Daily Value of sodium. Generally, foods with less than 300 mg of sodium per serving fit into this eating plan.  To find whole grains, look for the word "whole" as the first word in the ingredient list. Shopping  Buy products labeled as "low-sodium" or "no salt added."  Buy fresh foods. Avoid canned foods and premade or frozen meals. Cooking  Avoid adding salt when cooking. Use salt-free seasonings or herbs instead of table salt or sea salt. Check with your health care provider or pharmacist before using salt substitutes.  Do not fry foods. Cook foods using healthy methods such as baking, boiling, grilling, and broiling instead.  Cook with heart-healthy oils, such as olive, canola, soybean, or sunflower oil. Meal planning   Eat a balanced diet that includes: ? 5 or more servings of fruits and  vegetables each day. At each meal, try to fill half of your plate with fruits and vegetables. ? Up to 6-8 servings of whole grains each day. ? Less than 6 oz of lean meat, poultry, or fish each day. A 3-oz serving of meat is about the same size as a deck of cards. One egg equals 1 oz. ? 2 servings of low-fat dairy each day. ? A serving of nuts, seeds, or beans 5 times each week. ? Heart-healthy fats. Healthy fats called Omega-3 fatty acids are found in foods such as flaxseeds and coldwater fish, like sardines, salmon, and mackerel.  Limit how much you eat of the following: ? Canned or prepackaged foods. ? Food that is high in trans fat, such as fried foods. ? Food that is high in saturated fat, such as fatty meat. ? Sweets, desserts, sugary drinks, and other foods with added sugar. ? Full-fat dairy products.  Do not salt foods before eating.  Try to eat at least 2 vegetarian meals each week.  Eat more home-cooked food and less restaurant, buffet, and fast food.  When eating at a restaurant, ask that your food be prepared with less salt or no salt, if possible. What foods are recommended? The items listed may not be a complete list. Talk with your dietitian about what dietary choices are best for you. Grains Whole-grain or whole-wheat bread. Whole-grain or whole-wheat pasta. Brown rice. Orpah Cobb. Bulgur. Whole-grain and low-sodium cereals. Pita bread. Low-fat, low-sodium crackers. Whole-wheat flour tortillas. Vegetables Fresh or frozen vegetables (raw, steamed, roasted, or grilled). Low-sodium or reduced-sodium tomato and vegetable juice. Low-sodium or reduced-sodium tomato sauce and tomato paste. Low-sodium or reduced-sodium canned vegetables. Fruits All fresh, dried, or frozen fruit. Canned fruit in natural juice (without added sugar). Meat and other protein foods Skinless chicken or [place]. Ground chicken or [place]. Pork with fat trimmed off. Fish and seafood. Egg whites. Dried  beans, peas, or lentils. Unsalted nuts, nut butters, and seeds. Unsalted canned beans. Lean cuts of beef with fat trimmed off. Low-sodium, lean deli meat. Dairy Low-fat (1%) or fat-free (skim) milk. Fat-free, low-fat, or reduced-fat cheeses. Nonfat, low-sodium ricotta or cottage cheese. Low-fat or nonfat yogurt. Low-fat, low-sodium cheese. Fats and oils Soft margarine without trans fats. Vegetable oil. Low-fat, reduced-fat, or light mayonnaise and salad dressings (reduced-sodium). Canola, safflower, olive, soybean, and sunflower oils. Avocado. Seasoning and other foods Herbs. Spices. Seasoning mixes without salt. Unsalted popcorn and pretzels. Fat-free sweets. What foods are not recommended? The items listed may not be a complete list. Talk with your dietitian about what dietary choices are best for you. Grains Baked goods made with fat, such as croissants, muffins, or some breads. Dry pasta or rice meal packs. Vegetables Creamed or fried vegetables. Vegetables in a cheese sauce. Regular canned vegetables (not low-sodium or reduced-sodium). Regular canned tomato sauce and paste (not low-sodium or reduced-sodium). Regular tomato and vegetable juice (not low-sodium or reduced-sodium). Rosita Fire. Olives. Fruits Canned fruit in a light or heavy syrup. Fried fruit. Fruit in cream or butter sauce. Meat and other protein foods Fatty cuts of meat. Ribs. Fried meat. Tomasa Blase. Sausage. Bologna and other processed lunch meats. Salami. Fatback. Hotdogs. Bratwurst. Salted nuts and seeds. Canned beans with added salt. Canned or smoked fish. Whole eggs or egg yolks. Chicken or [place] with skin. Dairy Whole or 2% milk, cream, and half-and-half. Whole or full-fat cream cheese. Whole-fat or sweetened yogurt. Full-fat cheese. Nondairy creamers. Whipped toppings. Processed cheese and cheese spreads. Fats and oils Butter. Stick margarine. Lard. Shortening. Ghee. Bacon fat. Tropical oils, such as coconut, palm kernel, or  palm oil. Seasoning and other foods Salted popcorn and pretzels. Onion salt, garlic salt, seasoned salt, table salt, and sea salt. Worcestershire sauce. Tartar sauce. Barbecue sauce. Teriyaki sauce. Soy sauce, including reduced-sodium. Steak sauce. Canned and packaged gravies. Fish sauce. Oyster sauce. Cocktail sauce. Horseradish that you find on the shelf. Ketchup. Mustard. Meat flavorings and tenderizers. Bouillon cubes. Hot sauce and Tabasco sauce. Premade or packaged marinades. Premade or packaged taco seasonings. Relishes. Regular salad dressings. Where to find more information:  National Heart, Lung, and Blood Institute: PopSteam.is  American Heart Association: www.heart.org Summary  The DASH eating plan is a healthy eating plan that has been shown to reduce high blood pressure (hypertension). It may also reduce your risk for type 2 diabetes, heart disease, and stroke.  With the DASH eating plan, you should limit salt (sodium) intake to 2,300 mg a day. If you have hypertension, you may need to reduce your sodium intake  to 1,500 mg a day.  When on the DASH eating plan, aim to eat more fresh fruits and vegetables, whole grains, lean proteins, low-fat dairy, and heart-healthy fats.  Work with your health care provider or diet and nutrition specialist (dietitian) to adjust your eating plan to your individual calorie needs. This information is not intended to replace advice given to you by your health care provider. Make sure you discuss any questions you have with your health care provider. Document Released: 09/18/2011 Document Revised: 09/22/2016 Document Reviewed: 09/22/2016 Elsevier Interactive Patient Education  2017 [employer].

## 2017-07-14 LAB — LIPID PANEL
CHOLESTEROL TOTAL: 200 mg/dL — AB (ref 100–199)
Chol/HDL Ratio: 2.7 ratio (ref 0.0–5.0)
HDL: 73 mg/dL (ref >39)
LDL CALC: 116 mg/dL — AB (ref 0–99)
TRIGLYCERIDES: 56 mg/dL (ref 0–149)
VLDL CHOLESTEROL CAL: 11 mg/dL (ref 5–40)

## 2017-07-17 ENCOUNTER — Telehealth: Payer: Self-pay | Admitting: Neurology

## 2017-07-17 NOTE — Progress Notes (Signed)
Subjective:  Patient ID: Brandon Taylor, male    DOB: 1941/03/21  Age: 76 y.o. MRN: [id number]  CC: Hypertension   HPI Brandon Taylor presents for follow up. PMH of GERD, ETOH abuse, and vitamin B12 deficiency. He is accompanied by his wife.  He reports taking protonix for symptoms with adequate relief of symptoms. He report discontinuing alcohol use 3 years ago.  History of memory confusion. His wife reports patient followed up with neurology appointment diagnosed with mild dementia . Medications this treatment however patient refused. BP found in office to be elevated in office. He deneis any history of hypertension. Patient's blood pressure found to elevated in office today. Denies any past medical history of hypertension. Cardiac symptoms none. Patient denies chest pain, dyspnea, lower extremity edema and palpitations.  Cardiovascular risk factors: advanced age (older than 6 for men, 70 for women), male gender and sedentary lifestyle. Use of agents associated with hypertension: none. History of target organ damage: chronic kidney disease.    Outpatient Medications Prior to Visit  Medication Sig Dispense Refill  . aspirin 81 MG EC tablet Take 1 tablet (81 mg total) by mouth daily. 90 tablet 3  . feeding supplement, ENSURE ENLIVE, (ENSURE ENLIVE) LIQD Take 237 mLs by mouth 2 (two) times daily between meals. 237 mL 12  . folic acid (FOLVITE) 1 MG tablet Take 1 tablet (1 mg total) by mouth daily. 90 tablet 3  . Multiple Vitamin (MULTIVITAMIN WITH MINERALS) TABS tablet Take 1 tablet by mouth daily. 60 tablet 0  . pantoprazole (PROTONIX) 40 MG tablet Take 1 tablet (40 mg total) by mouth daily. 90 tablet 3  . pravastatin (PRAVACHOL) 20 MG tablet Take 1 tablet (20 mg total) by mouth daily. 30 tablet 2  . senna-docusate (SENOKOT-S) 8.6-50 MG tablet Take 1 tablet by mouth at bedtime as needed for mild constipation. 30 tablet 0  . thiamine ([facility]S B-1) 100 MG tablet Take 1 tablet (100 mg total) by mouth  daily. 100 tablet 3  . vitamin B-12 (CYANOCOBALAMIN) 1000 MCG tablet Take 1 tablet (1,000 mcg total) by mouth daily. 90 tablet 3   No facility-administered medications prior to visit.     ROS Review of Systems  Constitutional: Negative.   Eyes: Negative.   Respiratory: Negative.   Cardiovascular: Negative.   Gastrointestinal: Negative.   Skin: Negative.   Psychiatric/Behavioral: Negative.    Objective:  BP (!) 183/91 (BP Location: Left Arm, Patient Position: Sitting, Cuff Size: Normal)   Pulse 69   Temp (!) 97.5 F (36.4 C) (Oral)   Resp 18   Ht  (1.803 m)   Wt 131 lb 12.8 oz (59.8 kg)   SpO2 94%   BMI 18.38 kg/m   BP/Weight 07/13/2017 05/29/2017 05/13/2017  Systolic BP 183 164 133  Diastolic BP 91 66 76  Wt. (Lbs) 131.8 132 -  BMI 18.38 18.94 -   Physical Exam  Constitutional: He appears well-developed and well-nourished.  HENT:  Head: Normocephalic and atraumatic.  Right Ear: External ear normal.  Left Ear: External ear normal.  Nose: Nose normal.  Mouth/Throat: Oropharynx is clear and moist.  Neck: No JVD present. No thyromegaly present.  Cardiovascular: Normal rate, regular rhythm, normal heart sounds and intact distal pulses.   Pulmonary/Chest: Effort normal and breath sounds normal.  Abdominal: Soft. Bowel sounds are normal.  Neurological: He is alert. He is disoriented (disoriented to time and place. oriented to person and situation.).  Skin: Skin is warm and dry.  Psychiatric: He has a normal mood and affect. He is slowed. Cognition and memory are impaired.  Nursing note and vitals reviewed.   Assessment & Plan:   1. Essential hypertension Schedule BP recheck in 2 weeks with nurse. If BP is greater than 90/60 (MAP 65 or greater) but not less than 130/80 may increase dose of  amlodipine to 10 mg daily and recheck in another 2 weeks with nurse  - Lipid Panel - amLODipine (NORVASC) 5 MG tablet; Take 1 tablet (5 mg total) by mouth daily.  Dispense: 30  tablet; Refill: 2 - hydrochlorothiazide (HYDRODIURIL) 12.5 MG tablet; Take 1 tablet (12.5 mg total) by mouth daily.  Dispense: 30 tablet; Refill: 2  2. Needs flu shot  - Flu Vaccine QUAD 6+ mos PF IM (Fluarix Quad PF)  3. Need for pneumococcal vaccine  - pneumococcal 13-valent conjugate vaccine (PREVNAR 13) injection 0.5 mL; Inject 0.5 mLs into the muscle once.  4. Need for shingles vaccine Shingles vaccine unavailable.Prescription given.  - Zoster Vac Recomb Adjuvanted [facility]) injection; Inject 0.5 mLs into the muscle once.  Dispense: 0.5 mL; Refill: 0  5. History of dementia Recent referral and appointment with neurologist was recommended that patient begin a Aricept, patient declined time. Patient's wife is requesting contact information to follow up   Meds ordered this encounter  Medications  . pneumococcal 13-valent conjugate vaccine (PREVNAR 13) injection 0.5 mL  . Zoster Vac Recomb Adjuvanted [facility]) injection    Sig: Inject 0.5 mLs into the muscle once.    Dispense:  0.5 mL    Refill:  0    Order Specific Question:   Supervising Provider    Answer:   Quentin Angst [id number]  . amLODipine (NORVASC) 5 MG tablet    Sig: Take 1 tablet (5 mg total) by mouth daily.    Dispense:  30 tablet    Refill:  2    Order Specific Question:   Supervising Provider    Answer:   Quentin Angst [id number]  . hydrochlorothiazide (HYDRODIURIL) 12.5 MG tablet    Sig: Take 1 tablet (12.5 mg total) by mouth daily.    Dispense:  30 tablet    Refill:  2    Order Specific Question:   Supervising Provider    Answer:   Quentin Angst [id number]    Follow-up: Return in about 2 weeks (around 07/27/2017) for BP check with Travia.   Lizbeth Bark FNP

## 2017-07-17 NOTE — Telephone Encounter (Signed)
Please call as they have questions about meds - left message on voice mail.

## 2017-07-20 ENCOUNTER — Telehealth: Payer: Self-pay

## 2017-07-20 ENCOUNTER — Other Ambulatory Visit: Payer: Self-pay | Admitting: Family Medicine

## 2017-07-20 DIAGNOSIS — E782 Mixed hyperlipidemia: Secondary | ICD-10-CM | POA: Insufficient documentation

## 2017-07-20 DIAGNOSIS — Z9189 Other specified personal risk factors, not elsewhere classified: Secondary | ICD-10-CM | POA: Insufficient documentation

## 2017-07-20 MED ORDER — PRAVASTATIN SODIUM 40 MG PO TABS: 40.0000 mg | ORAL_TABLET | Freq: Every day | ORAL | 2 refills | Status: DC

## 2017-07-20 NOTE — Telephone Encounter (Signed)
CMA call regarding lab results   Patient wife Verify DOB   Patient wife was aware per DPR  and understood

## 2017-07-20 NOTE — Telephone Encounter (Signed)
-----   Message from Lizbeth Bark, FNP sent at 07/20/2017  4:37 PM EDT ----- Dosage of pravastatin will be increased. Cholesterol levels are high. This can increase your risk of heart disease overtime. Start eating a diet low in saturated fat. Limit your intake of fried foods, red meats, and whole milk.

## 2017-07-30 ENCOUNTER — Ambulatory Visit: Payer: Medicare Other | Attending: Family Medicine | Admitting: Pharmacist

## 2017-07-30 VITALS — BP 153/81 | HR 73

## 2017-07-30 DIAGNOSIS — I10 Essential (primary) hypertension: Secondary | ICD-10-CM | POA: Diagnosis not present

## 2017-07-30 NOTE — Progress Notes (Signed)
   S:    Patient arrives in good spirits with his wife.  Presents to the clinic for hypertension evaluation. Patient was referred on 07/13/17.  Patient was last seen by Primary Care Provider on 07/13/17.   Patient reports adherence with medications.  Current BP Medications include:  Amlodipine 5 mg daily, HCTZ 12.5 mg daily   He reports that his throat has been dry/hoarse recently. He reports that he has not been drinking as much water as he used to.  O:   Last 3 Office BP readings: BP Readings from Last 3 Encounters:  07/30/17 (!) 153/81  07/13/17 (!) 183/91  05/29/17 (!) 164/66    BMET    Component Value Date/Time   NA 143 04/10/2017 1708   K 4.1 04/10/2017 1708   CL 106 04/10/2017 1708   CO2 25 04/10/2017 1708   GLUCOSE 86 04/10/2017 1708   GLUCOSE 133 (H) 02/18/2017 1301   BUN 18 04/10/2017 1708   CREATININE 1.66 (H) 04/10/2017 1708   CREATININE 1.19 10/24/2014 1114   CALCIUM 9.2 04/10/2017 1708   GFRNONAA 39 (L) 04/10/2017 1708   GFRNONAA 60 10/24/2014 1114   GFRAA 45 (L) 04/10/2017 1708   GFRAA 69 10/24/2014 1114    A/P: Hypertension longstanding currently uncontrolled on current medications.The first blood pressure that was taken was 132/76 but the cuff was having difficulty inflating. Upon recheck, it was higher. Patient was irritated from recheck so will not make any changes right now and check it again in a few weeks manually to ensure accuracy. Patient will also drink more water in the next two weeks to see if that helps with his dry throat.  Results reviewed and written information provided.   Total time in face-to-face counseling 15 minutes.   F/U Clinic Visit with me in two weeks.  Patient seen with Theodoro Kosasheed Mohammed, PharmD Candidate

## 2017-07-30 NOTE — Patient Instructions (Addendum)
Thanks for coming to see us!  Follow up with me in 2 weeks

## 2017-08-18 ENCOUNTER — Encounter: Payer: Self-pay | Admitting: Pharmacist

## 2017-08-26 ENCOUNTER — Encounter: Payer: Self-pay | Admitting: Pharmacist

## 2017-08-26 ENCOUNTER — Ambulatory Visit: Payer: Medicare Other | Attending: Family Medicine | Admitting: Pharmacist

## 2017-08-26 VITALS — BP 127/74 | HR 72

## 2017-08-26 DIAGNOSIS — I10 Essential (primary) hypertension: Secondary | ICD-10-CM | POA: Insufficient documentation

## 2017-08-26 NOTE — Progress Notes (Signed)
   S:    Patient arrives in good spirits with his wife.  Presents to the clinic for hypertension evaluation. Patient was referred on 07/13/17.  Patient was last seen by Primary Care Provider on 07/13/17.   Patient reports adherence with medications.  Current BP Medications include:  Amlodipine 5 mg daily, HCTZ 12.5 mg daily   His wife requests the contact information for his neurologist to discuss his memory loss.  O:   Last 3 Office BP readings: BP Readings from Last 3 Encounters:  08/26/17 127/74  07/30/17 (!) 153/81  07/13/17 (!) 183/91    BMET    Component Value Date/Time   NA 143 04/10/2017 1708   K 4.1 04/10/2017 1708   CL 106 04/10/2017 1708   CO2 25 04/10/2017 1708   GLUCOSE 86 04/10/2017 1708   GLUCOSE 133 (H) 02/18/2017 1301   BUN 18 04/10/2017 1708   CREATININE 1.66 (H) 04/10/2017 1708   CREATININE 1.19 10/24/2014 1114   CALCIUM 9.2 04/10/2017 1708   GFRNONAA 39 (L) 04/10/2017 1708   GFRNONAA 60 10/24/2014 1114   GFRAA 45 (L) 04/10/2017 1708   GFRAA 69 10/24/2014 1114    A/P: Hypertension longstanding currently controlled on current medications. No recommendations for any changes. Follow up with PCP in January or sooner if any needs arise.  Contact information provided for neurologist.  Results reviewed and written information provided.   Total time in face-to-face counseling 10 minutes.

## 2017-08-26 NOTE — Patient Instructions (Addendum)
Your blood pressure looks great  Follow up with Brandon Taylor as directed (January 2019) unless you need us sooner  The neurologist you saw was Dr. Patrcia DollyKaren Aquino at [facility].  The number to reach their office is [phone]

## 2017-10-09 ENCOUNTER — Other Ambulatory Visit: Payer: Self-pay | Admitting: Family Medicine

## 2017-10-09 DIAGNOSIS — I10 Essential (primary) hypertension: Secondary | ICD-10-CM

## 2017-12-28 ENCOUNTER — Other Ambulatory Visit: Payer: Self-pay | Admitting: Pharmacist

## 2017-12-28 DIAGNOSIS — I10 Essential (primary) hypertension: Secondary | ICD-10-CM

## 2017-12-28 MED ORDER — HYDROCHLOROTHIAZIDE 12.5 MG PO TABS: 12.5000 mg | ORAL_TABLET | Freq: Every day | ORAL | 0 refills | Status: DC

## 2017-12-28 MED ORDER — AMLODIPINE BESYLATE 5 MG PO TABS: 5.0000 mg | ORAL_TABLET | Freq: Every day | ORAL | 0 refills | Status: DC

## 2018-01-05 DIAGNOSIS — H401233 Low-tension glaucoma, bilateral, severe stage: Secondary | ICD-10-CM | POA: Diagnosis not present

## 2018-01-05 DIAGNOSIS — Z961 Presence of intraocular lens: Secondary | ICD-10-CM | POA: Diagnosis not present

## 2018-01-29 ENCOUNTER — Other Ambulatory Visit: Payer: Self-pay | Admitting: Internal Medicine

## 2018-01-29 DIAGNOSIS — I10 Essential (primary) hypertension: Secondary | ICD-10-CM

## 2018-02-01 ENCOUNTER — Other Ambulatory Visit: Payer: Self-pay | Admitting: Pharmacist

## 2018-02-01 ENCOUNTER — Other Ambulatory Visit: Payer: Self-pay

## 2018-02-01 DIAGNOSIS — E538 Deficiency of other specified B group vitamins: Secondary | ICD-10-CM

## 2018-02-01 MED ORDER — VITAMIN B-12 1000 MCG PO TABS: 1000.0000 ug | ORAL_TABLET | Freq: Every day | ORAL | 0 refills | Status: DC

## 2018-02-02 ENCOUNTER — Other Ambulatory Visit: Payer: Self-pay | Admitting: Internal Medicine

## 2018-02-02 DIAGNOSIS — I10 Essential (primary) hypertension: Secondary | ICD-10-CM

## 2018-02-25 ENCOUNTER — Other Ambulatory Visit: Payer: Self-pay | Admitting: Family Medicine

## 2018-02-25 DIAGNOSIS — I10 Essential (primary) hypertension: Secondary | ICD-10-CM

## 2018-02-25 DIAGNOSIS — E538 Deficiency of other specified B group vitamins: Secondary | ICD-10-CM

## 2018-03-23 ENCOUNTER — Other Ambulatory Visit: Payer: Self-pay | Admitting: Family Medicine

## 2018-03-23 DIAGNOSIS — E538 Deficiency of other specified B group vitamins: Secondary | ICD-10-CM

## 2018-03-23 DIAGNOSIS — I10 Essential (primary) hypertension: Secondary | ICD-10-CM

## 2018-03-29 ENCOUNTER — Other Ambulatory Visit: Payer: Self-pay

## 2018-03-29 DIAGNOSIS — Z760 Encounter for issue of repeat prescription: Secondary | ICD-10-CM

## 2018-03-29 DIAGNOSIS — K219 Gastro-esophageal reflux disease without esophagitis: Secondary | ICD-10-CM

## 2018-03-29 MED ORDER — PANTOPRAZOLE SODIUM 40 MG PO TBEC: 40.0000 mg | DELAYED_RELEASE_TABLET | Freq: Every day | ORAL | 0 refills | Status: DC

## 2018-03-29 MED ORDER — FOLIC ACID 1 MG PO TABS: 1.0000 mg | ORAL_TABLET | Freq: Every day | ORAL | 0 refills | Status: DC

## 2018-03-29 MED ORDER — ASPIRIN 81 MG PO TBEC: 81.0000 mg | DELAYED_RELEASE_TABLET | Freq: Every day | ORAL | 0 refills | Status: DC

## 2018-04-18 ENCOUNTER — Other Ambulatory Visit: Payer: Self-pay | Admitting: Family Medicine

## 2018-04-18 DIAGNOSIS — I10 Essential (primary) hypertension: Secondary | ICD-10-CM

## 2018-04-23 ENCOUNTER — Other Ambulatory Visit: Payer: Self-pay | Admitting: Family Medicine

## 2018-04-23 DIAGNOSIS — E538 Deficiency of other specified B group vitamins: Secondary | ICD-10-CM

## 2018-04-27 ENCOUNTER — Other Ambulatory Visit: Payer: Self-pay | Admitting: Family Medicine

## 2018-04-27 DIAGNOSIS — E538 Deficiency of other specified B group vitamins: Secondary | ICD-10-CM

## 2018-04-27 DIAGNOSIS — I10 Essential (primary) hypertension: Secondary | ICD-10-CM

## 2018-04-28 ENCOUNTER — Other Ambulatory Visit: Payer: Self-pay | Admitting: Family Medicine

## 2018-04-28 DIAGNOSIS — K219 Gastro-esophageal reflux disease without esophagitis: Secondary | ICD-10-CM

## 2018-04-28 DIAGNOSIS — Z760 Encounter for issue of repeat prescription: Secondary | ICD-10-CM

## 2018-05-21 ENCOUNTER — Encounter: Payer: Self-pay | Admitting: Internal Medicine

## 2018-05-21 ENCOUNTER — Ambulatory Visit: Payer: Medicare Other | Attending: Internal Medicine | Admitting: Internal Medicine

## 2018-05-21 VITALS — BP 169/82 | HR 67 | Temp 97.4°F | Resp 16 | Ht 71.0 in | Wt 133.6 lb

## 2018-05-21 DIAGNOSIS — D649 Anemia, unspecified: Secondary | ICD-10-CM

## 2018-05-21 DIAGNOSIS — Z7982 Long term (current) use of aspirin: Secondary | ICD-10-CM | POA: Diagnosis not present

## 2018-05-21 DIAGNOSIS — I10 Essential (primary) hypertension: Secondary | ICD-10-CM

## 2018-05-21 DIAGNOSIS — E785 Hyperlipidemia, unspecified: Secondary | ICD-10-CM

## 2018-05-21 DIAGNOSIS — Z87891 Personal history of nicotine dependence: Secondary | ICD-10-CM | POA: Diagnosis not present

## 2018-05-21 DIAGNOSIS — F0390 Unspecified dementia without behavioral disturbance: Secondary | ICD-10-CM | POA: Insufficient documentation

## 2018-05-21 DIAGNOSIS — E538 Deficiency of other specified B group vitamins: Secondary | ICD-10-CM | POA: Diagnosis not present

## 2018-05-21 DIAGNOSIS — K219 Gastro-esophageal reflux disease without esophagitis: Secondary | ICD-10-CM

## 2018-05-21 DIAGNOSIS — E782 Mixed hyperlipidemia: Secondary | ICD-10-CM | POA: Insufficient documentation

## 2018-05-21 DIAGNOSIS — Z7289 Other problems related to lifestyle: Secondary | ICD-10-CM | POA: Diagnosis not present

## 2018-05-21 MED ORDER — PANTOPRAZOLE SODIUM 40 MG PO TBEC: 40.0000 mg | DELAYED_RELEASE_TABLET | Freq: Every day | ORAL | 3 refills | Status: DC

## 2018-05-21 MED ORDER — AMLODIPINE BESYLATE 5 MG PO TABS: 5.0000 mg | ORAL_TABLET | Freq: Every day | ORAL | 1 refills | Status: DC

## 2018-05-21 MED ORDER — CYANOCOBALAMIN 1000 MCG PO TABS: ORAL_TABLET | ORAL | 1 refills | Status: DC

## 2018-05-21 MED ORDER — PRAVASTATIN SODIUM 20 MG PO TABS: 20.0000 mg | ORAL_TABLET | Freq: Every day | ORAL | 1 refills | Status: DC

## 2018-05-21 NOTE — Progress Notes (Signed)
Patient ID: Brandon Taylor, male    DOB: 31-Aug-1941  MRN: [id number]  CC: re-establish and Hypertension   Subjective: Brandon Taylor is a 77 y.o. male who presents for chronic ds management and to est with me as PCP.  Wife, Brandon Taylor, is with him.  His concerns today include:  Pt with hx of GERD, ETOH use disorder, vit B12 def, HL, HTN, mild dementia  C/o itching when he takes meds so he d/c taking everything 3 wks ago.  Looks like the HCTZ was last refilled in June of this year and the Norvasc was not feeling quite some time so I doubt it was the Norvasc causing the itching. -no device to check BP limits salt No CP/SOB.  + LE edema x 1 wk  GERD: not taking Protonix every day.  Only when he needs to.  Dementia:  Thinks memory "in good shape."  He does not drive.  Sleeping ok.  No falls. He is independent in ADLs.  Wife expresses no concern about his memory.  ETOH:  Quit drinking about 2 yrs ago.     Patient Active Problem List   Diagnosis Date Noted  . Mixed hyperlipidemia 07/20/2017  . Candidate for statin therapy due to risk of future cardiovascular event 07/20/2017  . Mild dementia 05/29/2017  . AKI (acute kidney injury) (HCC) 02/17/2017  . Surgical site reaction 12/06/2015  . Routine general medical examination at a health care facility 10/24/2014  . Vitamin B12 deficiency 10/03/2013  . Loss of weight 10/03/2013  . Dysphagia, unspecified(787.20) 10/03/2013  . Alcohol abuse 10/03/2013  . Nicotine abuse 10/03/2013  . Hypoglycemia 09/22/2013  . General weakness 09/22/2013  . SIRS (systemic inflammatory response syndrome) (HCC) 09/22/2013  . GERD (gastroesophageal reflux disease)   . ETOH abuse      Current Outpatient Medications on File Prior to Visit  Medication Sig Dispense Refill  . aspirin 81 MG EC tablet Take 1 tablet (81 mg total) by mouth daily. 30 tablet 0  . feeding supplement, ENSURE ENLIVE, (ENSURE ENLIVE) LIQD Take 237 mLs by mouth 2 (two) times daily between  meals. 237 mL 12  . Multiple Vitamin (MULTIVITAMIN WITH MINERALS) TABS tablet Take 1 tablet by mouth daily. 60 tablet 0   No current facility-administered medications on file prior to visit.     No Known Allergies  Social History   Socioeconomic History  . Marital status: Married    Spouse name: Not on file  . Number of children: Not on file  . Years of education: Not on file  . Highest education level: Not on file  Occupational History  . Not on file  Social Needs  . Financial resource strain: Not on file  . Food insecurity:    Worry: Not on file    Inability: Not on file  . Transportation needs:    Medical: Not on file    Non-medical: Not on file  Tobacco Use  . Smoking status: Former Smoker    Packs/day: 2.00    Years: 40.00    Pack years: 80.00    Last attempt to quit: 10/13/2012    Years since quitting: 5.6  . Smokeless tobacco: Never Used  Substance and Sexual Activity  . Alcohol use: No    Comment: fifth daily  . Drug use: No  . Sexual activity: Not on file  Lifestyle  . Physical activity:    Days per week: Not on file    Minutes per session: Not on  file  . Stress: Not on file  Relationships  . Social connections:    Talks on phone: Not on file    Gets together: Not on file    Attends religious service: Not on file    Active member of club or organization: Not on file    Attends meetings of clubs or organizations: Not on file    Relationship status: Not on file  . Intimate partner violence:    Fear of current or ex partner: Not on file    Emotionally abused: Not on file    Physically abused: Not on file    Forced sexual activity: Not on file  Other Topics Concern  . 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 COPD Brother     Past Surgical  History:  Procedure Laterality Date  . NO PAST SURGERIES      ROS: Review of Systems Negative except as stated above PHYSICAL EXAM: BP (!) 169/82   Pulse 67   Temp (!) 97.4 F (36.3 C) (Oral)   Resp 16   Ht 5\' 11"  (1.803 m)   Wt 133 lb 9.6 oz (60.6 kg)   SpO2 98%   BMI 18.63 kg/m   Wt Readings from Last 3 Encounters:  05/21/18 133 lb 9.6 oz (60.6 kg)  07/13/17 131 lb 12.8 oz (59.8 kg)  05/29/17 132 lb (59.9 kg)   Repeat BP 160/72 Physical Exam  General appearance - alert, well appearing, and in no distress Mental status - normal mood, behavior, speech, dress, motor activity, and thought processes Eyes: Positive arcus senilis.  Slightly pale conjunctiva Neck - supple, no significant adenopathy Chest - clear to auscultation, no wheezes, rales or rhonchi, symmetric air entry Heart - normal rate, regular rhythm, normal S1, S2, no murmurs, rubs, clicks or gallops Abdomen - soft, nontender, nondistended, no masses or organomegaly Extremities - peripheral pulses normal, no pedal edema, no clubbing or cyanosis   ASSESSMENT AND PLAN: 1. Essential hypertension Not at goal.  Restart amlodipine.  Stop HCTZ as the possible culprit of his itching. - CBC - Comprehensive metabolic panel - amLODipine (NORVASC) 5 MG tablet; Take 1 tablet (5 mg total) by mouth daily.  Dispense: 90 tablet; Refill: 1  2. Hyperlipidemia, unspecified hyperlipidemia type Decrease Pravachol to 20 mg daily. - Lipid panel - pravastatin (PRAVACHOL) 20 MG tablet; Take 1 tablet (20 mg total) by mouth daily.  Dispense: 90 tablet; Refill: 1  3. Vitamin B12 deficiency - cyanocobalamin ([facility]S VITAMIN B12) 1000 MCG tablet; TAKE 1 TABLET BY MOUTH EVERY Day  Dispense: 90 tablet; Refill: 1 - Vitamin B12  4. Gastroesophageal reflux disease, esophagitis presence not specified Protonix as needed - pantoprazole (PROTONIX) 40 MG tablet; Take 1 tablet (40 mg total) by mouth daily.  Dispense: 30 tablet; Refill:  3   Patient was given the opportunity to ask questions.  Patient verbalized understanding of the plan and was able to repeat key elements of the plan.   Orders Placed This Encounter  Procedures  . CBC  . Comprehensive metabolic panel  . Lipid panel  . Vitamin B12     Requested Prescriptions   Signed Prescriptions Disp Refills  . pantoprazole (PROTONIX) 40 MG tablet 30  tablet 3    Sig: Take 1 tablet (40 mg total) by mouth daily.  . pravastatin (PRAVACHOL) 20 MG tablet 90 tablet 1    Sig: Take 1 tablet (20 mg total) by mouth daily.  . cyanocobalamin (CVS VITAMIN B12) 1000 MCG tablet 90 tablet 1    Sig: TAKE 1 TABLET BY MOUTH EVERY Day  . amLODipine (NORVASC) 5 MG tablet 90 tablet 1    Sig: Take 1 tablet (5 mg total) by mouth daily.    Return in about 4 months (around 09/20/2018).  Jonah Blueeborah Markies Mowatt, MD, FACP

## 2018-05-21 NOTE — Progress Notes (Signed)
Pt states he feel like he is going to throw up

## 2018-05-22 LAB — CBC
HEMATOCRIT: 34.7 % — AB (ref 37.5–51.0)
HEMOGLOBIN: 11.1 g/dL — AB (ref 13.0–17.7)
MCH: 28.4 pg (ref 26.6–33.0)
MCHC: 32 g/dL (ref 31.5–35.7)
MCV: 89 fL (ref 79–97)
Platelets: 245 10*3/uL (ref 150–450)
RBC: 3.91 x10E6/uL — AB (ref 4.14–5.80)
RDW: 15.2 % (ref 12.3–15.4)
WBC: 6.2 10*3/uL (ref 3.4–10.8)

## 2018-05-22 LAB — COMPREHENSIVE METABOLIC PANEL
ALBUMIN: 4.1 g/dL (ref 3.5–4.8)
ALK PHOS: 73 IU/L (ref 39–117)
ALT: 10 IU/L (ref 0–44)
AST: 17 IU/L (ref 0–40)
Albumin/Globulin Ratio: 1.7 (ref 1.2–2.2)
BUN / CREAT RATIO: 14 (ref 10–24)
BUN: 17 mg/dL (ref 8–27)
Bilirubin Total: 0.2 mg/dL (ref 0.0–1.2)
CALCIUM: 9.4 mg/dL (ref 8.6–10.2)
CO2: 26 mmol/L (ref 20–29)
CREATININE: 1.23 mg/dL (ref 0.76–1.27)
Chloride: 104 mmol/L (ref 96–106)
GFR calc Af Amer: 65 mL/min/{1.73_m2} (ref >59)
GFR, EST NON AFRICAN AMERICAN: 56 mL/min/{1.73_m2} — AB (ref >59)
GLUCOSE: 80 mg/dL (ref 65–99)
Globulin, Total: 2.4 g/dL (ref 1.5–4.5)
Potassium: 4.5 mmol/L (ref 3.5–5.2)
Sodium: 146 mmol/L — ABNORMAL HIGH (ref 134–144)
Total Protein: 6.5 g/dL (ref 6.0–8.5)

## 2018-05-22 LAB — LIPID PANEL
CHOL/HDL RATIO: 3 ratio (ref 0.0–5.0)
CHOLESTEROL TOTAL: 191 mg/dL (ref 100–199)
HDL: 63 mg/dL (ref >39)
LDL CALC: 102 mg/dL — AB (ref 0–99)
Triglycerides: 132 mg/dL (ref 0–149)
VLDL Cholesterol Cal: 26 mg/dL (ref 5–40)

## 2018-05-22 LAB — VITAMIN B12: Vitamin B-12: 539 pg/mL (ref 232–1245)

## 2018-05-24 ENCOUNTER — Encounter: Payer: Self-pay | Admitting: Neurology

## 2018-05-24 ENCOUNTER — Ambulatory Visit: Payer: Medicare Other | Admitting: Neurology

## 2018-05-24 ENCOUNTER — Telehealth: Payer: Self-pay

## 2018-05-24 ENCOUNTER — Other Ambulatory Visit: Payer: Self-pay

## 2018-05-24 VITALS — BP 132/76 | HR 72 | Ht 71.0 in | Wt 133.0 lb

## 2018-05-24 DIAGNOSIS — F03A0 Unspecified dementia, mild, without behavioral disturbance, psychotic disturbance, mood disturbance, and anxiety: Secondary | ICD-10-CM

## 2018-05-24 DIAGNOSIS — F0390 Unspecified dementia without behavioral disturbance: Secondary | ICD-10-CM | POA: Diagnosis not present

## 2018-05-24 NOTE — Addendum Note (Signed)
Addended by: Jonah BlueJOHNSON, Zephan Beauchaine B on: 05/24/2018 08:59 PM   Modules accepted: Orders

## 2018-05-24 NOTE — Progress Notes (Signed)
NEUROLOGY FOLLOW UP OFFICE NOTE  Brandon Taylor [id number] 04/15/1941  HISTORY OF PRESENT ILLNESS: I had the pleasure of seeing Brandon Taylor in follow-up in the neurology clinic on 05/24/2018.  The patient was last seen a year ago for mild dementia and is again accompanied by his wife who helps supplement the history today.  Records and images were personally reviewed where available. MMSE in July 2018 was 17/28 (patient illiterate). Head CT in 05/2017 showed mild age-related volume loss, no acute changes, atherosclerotic calcification of the major vessels at the base of the brain. He reports doing well and states his memory is fine. His wife has not noticed any significant changes since his last visit, he is forgetful. He continues to manage his own medications and wife states he has no difficulties with this. He does not drive. Wife is in charge of finances. He is able to bathe and dress independently. He denies any headaches, dizziness, vision changes, focal numbness/tingling/weakness, no falls.   History on Initial Assessment 05/29/2018: This is a 77 year old right-handed man with a history of alcohol abuse (stopped in 2014), presenting for evaluation of mental confusion. He feels his memory "sometimes does good, sometimes it don't." His wife started noticing changes around 5 years ago, but states "it's just age," she states "its' not like he does not know where he is, he does not pay attention if it doesn't concern him." She reports he does not repeat himself, then later on tells me he is not good with dates, she had to tell him his birthday repeatedly and he is just now getting that. He has never kept up with doctor appointments, she would manage this for him. She manages finances. She reports he is good with taking his medications. He stopped driving years ago. No difficulties with ADLs. He has difficulties following complicated instructions. He gets more irritable, but no significant personality  changes, paranoia, or hallucinations. His wife denies any confusion, stating he is very alert. When asked why he is here today, they report that "he does not want to get questioned," and that when he was unable to perform MMSE at PCP office ("made me mad"), he was referred for evaluation.  They deny any family history of dementia, no significant head injuries. He drank heavily ("beer, liquor, wine, all of them") until 2014. His wife has noticed his speech has been more nasal for the past year, she feels after he stopped smoking. He reports some difficulties swallowing liquids and had 2 swallow tests that were normal. He has occasional numbness in his calves, "they swell up." He denies any headaches, dizziness, diplopia, dysarthria, neck/back pain, bowel/bladder dysfunction, anosmia, or tremors.    PAST MEDICAL HISTORY: Past Medical History:  Diagnosis Date  . ETOH abuse   . GERD (gastroesophageal reflux disease)     MEDICATIONS: Current Outpatient Medications on File Prior to Visit  Medication Sig Dispense Refill  . amLODipine (NORVASC) 5 MG tablet Take 1 tablet (5 mg total) by mouth daily. 90 tablet 1  . aspirin 81 MG EC tablet Take 1 tablet (81 mg total) by mouth daily. 30 tablet 0  . cyanocobalamin ([facility] VITAMIN B12) 1000 MCG tablet TAKE 1 TABLET BY MOUTH EVERY Day 90 tablet 1  . feeding supplement, ENSURE ENLIVE, (ENSURE ENLIVE) LIQD Take 237 mLs by mouth 2 (two) times daily between meals. 237 mL 12  . Multiple Vitamin (MULTIVITAMIN WITH MINERALS) TABS tablet Take 1 tablet by mouth daily. 60 tablet 0  .  pantoprazole (PROTONIX) 40 MG tablet Take 1 tablet (40 mg total) by mouth daily. 30 tablet 3  . pravastatin (PRAVACHOL) 20 MG tablet Take 1 tablet (20 mg total) by mouth daily. 90 tablet 1   No current facility-administered medications on file prior to visit.     ALLERGIES: No Known Allergies  FAMILY HISTORY: Family History  Problem Relation Age of Onset  . COPD Brother      SOCIAL HISTORY: Social History   Socioeconomic History  . Marital status: Married    Spouse name: Not on file  . Number of children: Not on file  . Years of education: Not on file  . Highest education level: Not on file  Occupational History  . Not on file  Social Needs  . Financial resource strain: Not on file  . Food insecurity:    Worry: Not on file    Inability: Not on file  . Transportation needs:    Medical: Not on file    Non-medical: Not on file  Tobacco Use  . Smoking status: Former Smoker    Packs/day: 2.00    Years: 40.00    Pack years: 80.00    Last attempt to quit: 10/13/2012    Years since quitting: 5.6  . Smokeless tobacco: Never Used  Substance and Sexual Activity  . Alcohol use: No    Comment: fifth daily  . Drug use: No  . Sexual activity: Not on file  Lifestyle  . Physical activity:    Days per week: Not on file    Minutes per session: Not on file  . Stress: Not on file  Relationships  . Social connections:    Talks on phone: Not on file    Gets together: Not on file    Attends religious service: Not on file    Active member of club or organization: Not on file    Attends meetings of clubs or organizations: Not on file    Relationship status: Not on file  . Intimate partner violence:    Fear of current or ex partner: Not on file    Emotionally abused: Not on file    Physically abused: Not on file    Forced sexual activity: Not on file  Other Topics Concern  . 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[employer] (?)    REVIEW OF SYSTEMS: Constitutional: No fevers, chills, or sweats, no generalized fatigue, change in appetite Eyes: No visual changes, double vision, eye pain Ear, nose and throat: No hearing loss, ear pain, nasal congestion, sore throat Cardiovascular: No chest pain,  palpitations Respiratory:  No shortness of breath at rest or with exertion, wheezes GastrointestinaI: No nausea, vomiting, diarrhea, abdominal pain, fecal incontinence Genitourinary:  No dysuria, urinary retention or frequency Musculoskeletal:  No neck pain, back pain Integumentary: No rash, pruritus, skin lesions Neurological: as above Psychiatric: No depression, insomnia, anxiety Endocrine: No palpitations, fatigue, diaphoresis, mood swings, change in appetite, change in weight, increased thirst Hematologic/Lymphatic:  No anemia, purpura, petechiae. Allergic/Immunologic: no itchy/runny eyes, nasal congestion, recent allergic reactions, rashes  PHYSICAL EXAM: Vitals:   05/24/18 1008  BP: 132/76  Pulse: 72  SpO2: 97%   General: No acute distress Head:  Normocephalic/atraumatic Neck: supple, no paraspinal tenderness, full range of motion Heart:  Regular rate and  rhythm Lungs:  Clear to auscultation bilaterally Back: No paraspinal tenderness Skin/Extremities: No rash, no edema Neurological Exam: alert and oriented to person, knows [place] in [place]. No aphasia or dysarthria. Fund of knowledge is reduced.  Recent and remote memory are impaired.  Attention and concentration are reduced, he can give days of week, refuses to give months of year, spell WORLD or do serial 7s. Able to name objects and repeat phrases. CDT 4/5 MMSE - Mini Mental State Exam 05/24/2018 05/29/2017 04/10/2017  Not completed: - - Unable to complete  Orientation to time 0 2 2  Orientation to Place 3 4 4   Registration 3 3 3   Attention/ Calculation 0 0 0  Recall 0 2 2  Language- name 2 objects 2 2 2   Language- repeat 1 1 1   Language- follow 3 step command 2 3 2   Language- read & follow direction 0 0 -  Write a sentence 0 0 -  Copy design 0 0 -  Total score 11 17 -   Cranial nerves: Pupils equal, round, reactive to light.  Extraocular movements intact with no nystagmus. Visual fields full.  Facial sensation intact. No facial asymmetry. Tongue, uvula, palate midline.  Motor: Bulk and tone normal, muscle strength 5/5 throughout with no pronator drift.  Sensation to light touch intact.  No extinction to double simultaneous stimulation.  Finger to nose testing intact.  Gait narrow-based and steady, able to tandem walk adequately.  Romberg negative.  IMPRESSION: This is a 77 yo RH man with a history of alcohol abuse (none since 2014) with mild dementia, likely Alzheimer's type. Head CT no acute changes. His MMSE today is 11/28 (illiterate), a decline from prior testing. He and his wife report he continues to manage medications independently, continue to monitor. He does not drive. We again discussed starting Aricept, which he declines today. His wife expressed interest and will speak to him after the visit. They know to call if they would like to proceed. We again discussed the importance of control of vascular risk factors, physical exercise, and brain stimulation exercises for brain health. He agrees to follow-up in 1 year and knows to call for any changes  Thank you for allowing me to participate in his care.  Please do not hesitate to call for any questions or concerns.  The duration of this appointment visit was 30 minutes of face-to-face time with the patient.  Greater than 50% of this time was spent in counseling, explanation of diagnosis, planning of further management, and coordination of care.   Patrcia DollyKaren Charma Mocarski, M.D.   CC: Arrie SenateMandesia Hairston, FNP

## 2018-05-24 NOTE — Telephone Encounter (Signed)
Contacted pt wife to go over lab results pt wife is aware of results  Dr. Laural BenesJohnson pt wife is wanting to know would you be able to send something to the pharmacy for pt anemia

## 2018-05-24 NOTE — Patient Instructions (Signed)
Follow-up in 1 year. If you would like to proceed with starting a medication to potentially help slow down the worsening of memory, please call our office.  FALL PRECAUTIONS: Be cautious when walking. Scan the area for obstacles that may increase the risk of trips and falls. When getting up in the mornings, sit up at the edge of the bed for a few minutes before getting out of bed. Consider elevating the bed at the head end to avoid drop of blood pressure when getting up. Walk always in a well-lit room (use night lights in the walls). Avoid area rugs or power cords from appliances in the middle of the walkways. Use a walker or a cane if necessary and consider physical therapy for balance exercise. Get your eyesight checked regularly.  HOME SAFETY: Consider the safety of the kitchen when operating appliances like stoves, microwave oven, and blender. Consider having supervision and share cooking responsibilities until no longer able to participate in those. Accidents with firearms and other hazards in the house should be identified and addressed as well.  ABILITY TO BE LEFT ALONE: If patient is unable to contact 911 operator, consider using LifeLine, or when the need is there, arrange for someone to stay with patients. Smoking is a fire hazard, consider supervision or cessation. Risk of wandering should be assessed by caregiver and if detected at any point, supervision and safe proof recommendations should be instituted.  MEDICATION SUPERVISION: Inability to self-administer medication needs to be constantly addressed. Implement a mechanism to ensure safe administration of the medications.  RECOMMENDATIONS FOR ALL PATIENTS WITH MEMORY PROBLEMS: 1. Continue to exercise (Recommend 30 minutes of walking everyday, or 3 hours every week) 2. Increase social interactions - continue going to [place] and enjoy social gatherings with friends and family 3. Eat healthy, avoid fried foods and eat more fruits and  vegetables 4. Maintain adequate blood pressure, blood sugar, and blood cholesterol level. Reducing the risk of stroke and cardiovascular disease also helps promoting better memory. 5. Avoid stressful situations. Live a simple life and avoid aggravations. Organize your time and prepare for the next day in anticipation. 6. Sleep well, avoid any interruptions of sleep and avoid any distractions in the bedroom that may interfere with adequate sleep quality 7. Avoid sugar, avoid sweets as there is a strong link between excessive sugar intake, diabetes, and cognitive impairment The Mediterranean diet has been shown to help patients reduce the risk of progressive memory disorders and reduces cardiovascular risk. This includes eating fish, eat fruits and green leafy vegetables, nuts like almonds and hazelnuts, walnuts, and also use olive oil. Avoid fast foods and fried foods as much as possible. Avoid sweets and sugar as sugar use has been linked to worsening of memory function.  There is always a concern of gradual progression of memory problems. If this is the case, then we may need to adjust level of care according to patient needs. Support, both to the patient and caregiver, should then be put into place.

## 2018-05-25 NOTE — Telephone Encounter (Signed)
Returned a call to pt's wife and made aware of Dr. Laural BenesJohnson response pt wife doesn't have any questions or concerns

## 2018-06-10 DIAGNOSIS — N200 Calculus of kidney: Secondary | ICD-10-CM | POA: Diagnosis not present

## 2018-06-22 DIAGNOSIS — Z961 Presence of intraocular lens: Secondary | ICD-10-CM | POA: Diagnosis not present

## 2018-06-22 DIAGNOSIS — H401232 Low-tension glaucoma, bilateral, moderate stage: Secondary | ICD-10-CM | POA: Diagnosis not present

## 2018-08-04 ENCOUNTER — Other Ambulatory Visit: Payer: Self-pay | Admitting: Family Medicine

## 2018-08-18 ENCOUNTER — Other Ambulatory Visit: Payer: Self-pay | Admitting: Internal Medicine

## 2018-08-18 DIAGNOSIS — K219 Gastro-esophageal reflux disease without esophagitis: Secondary | ICD-10-CM

## 2018-09-20 ENCOUNTER — Encounter: Payer: Self-pay | Admitting: Internal Medicine

## 2018-09-20 ENCOUNTER — Ambulatory Visit: Payer: Medicare Other | Attending: Internal Medicine | Admitting: Internal Medicine

## 2018-09-20 VITALS — BP 144/69 | HR 74 | Temp 98.1°F | Resp 16 | Wt 134.2 lb

## 2018-09-20 DIAGNOSIS — Z79899 Other long term (current) drug therapy: Secondary | ICD-10-CM | POA: Insufficient documentation

## 2018-09-20 DIAGNOSIS — E785 Hyperlipidemia, unspecified: Secondary | ICD-10-CM | POA: Diagnosis not present

## 2018-09-20 DIAGNOSIS — G309 Alzheimer's disease, unspecified: Secondary | ICD-10-CM

## 2018-09-20 DIAGNOSIS — F0280 Dementia in other diseases classified elsewhere without behavioral disturbance: Secondary | ICD-10-CM

## 2018-09-20 DIAGNOSIS — Z23 Encounter for immunization: Secondary | ICD-10-CM | POA: Diagnosis not present

## 2018-09-20 DIAGNOSIS — D649 Anemia, unspecified: Secondary | ICD-10-CM | POA: Diagnosis not present

## 2018-09-20 DIAGNOSIS — L853 Xerosis cutis: Secondary | ICD-10-CM

## 2018-09-20 DIAGNOSIS — Z87891 Personal history of nicotine dependence: Secondary | ICD-10-CM | POA: Insufficient documentation

## 2018-09-20 DIAGNOSIS — I10 Essential (primary) hypertension: Secondary | ICD-10-CM

## 2018-09-20 DIAGNOSIS — E782 Mixed hyperlipidemia: Secondary | ICD-10-CM | POA: Insufficient documentation

## 2018-09-20 DIAGNOSIS — Z7982 Long term (current) use of aspirin: Secondary | ICD-10-CM | POA: Insufficient documentation

## 2018-09-20 DIAGNOSIS — L309 Dermatitis, unspecified: Secondary | ICD-10-CM | POA: Insufficient documentation

## 2018-09-20 DIAGNOSIS — K219 Gastro-esophageal reflux disease without esophagitis: Secondary | ICD-10-CM | POA: Insufficient documentation

## 2018-09-20 MED ORDER — TETANUS-DIPHTH-ACELL PERTUSSIS 5-2.5-18.5 LF-MCG/0.5 IM SUSP: 0.5000 mL | INTRAMUSCULAR | 0 refills | Status: DC

## 2018-09-20 NOTE — Progress Notes (Signed)
Patient ID: Stanford BreedHarry L Vasallo, male    DOB: 06/11/1941  MRN: [id number]  CC: Follow-up (4 month)   Subjective: Brandon Taylor is a 77 y.o. male who presents for chronic disease management.  Wife is with him.  His concerns today include:  Pt with hx of GERD, ETOH use disorder, vit B12 def, HL, HTN, mild dementia  Dementia: Patient was seen by his neurologist Dr. Karel JarvisAquino in August shortly after our last visit.  His MMSE score had declined from 17 to 11/28 from the year prior.  Patient declined Aricept.  Patient reports that he stays busy working in his yard cutting wood, raking leaves and just taking care of the yard in general.  He is still independent in his ADLs.  Reports that his appetite is good and he is sleeping well.  Wife gives him his medications every day.  She denies any safety issues or concerns.  HTN: On last visit we discontinued HCTZ as he thought it may have been contributing to her rash.  He is on Norvasc 5 mg but did not take it as yet for the morning.  He plans to take it when he returns home.  He denies any chest pains, shortness of breath, headaches, dizziness or lower extremity edema.    HL: Compliant with Pravachol.  Denies any muscle aches when he takes the medication.  Health maintenance: Reportedly had flu shot and Shingrix last mth at [facility].  Agreeable to tdap.   Patient Active Problem List   Diagnosis Date Noted  . Mixed hyperlipidemia 07/20/2017  . Candidate for statin therapy due to risk of future cardiovascular event 07/20/2017  . Mild dementia (HCC) 05/29/2017  . AKI (acute kidney injury) (HCC) 02/17/2017  . Surgical site reaction 12/06/2015  . Routine general medical examination at a health care facility 10/24/2014  . Vitamin B12 deficiency 10/03/2013  . Loss of weight 10/03/2013  . Dysphagia, unspecified(787.20) 10/03/2013  . Alcohol abuse 10/03/2013  . Nicotine abuse 10/03/2013  . Hypoglycemia 09/22/2013  . General weakness 09/22/2013  . SIRS  (systemic inflammatory response syndrome) (HCC) 09/22/2013  . GERD (gastroesophageal reflux disease)   . ETOH abuse      Current Outpatient Medications on File Prior to Visit  Medication Sig Dispense Refill  . amLODipine (NORVASC) 5 MG tablet Take 1 tablet (5 mg total) by mouth daily. 90 tablet 1  . aspirin 81 MG EC tablet TAKE 1 TABLET BY MOUTH EVERY DAY 30 tablet 2  . cyanocobalamin ([facility]S VITAMIN B12) 1000 MCG tablet TAKE 1 TABLET BY MOUTH EVERY Day 90 tablet 1  . feeding supplement, ENSURE ENLIVE, (ENSURE ENLIVE) LIQD Take 237 mLs by mouth 2 (two) times daily between meals. (Patient not taking: Reported on 09/20/2018) 237 mL 12  . Multiple Vitamin (MULTIVITAMIN WITH MINERALS) TABS tablet Take 1 tablet by mouth daily. 60 tablet 0  . pantoprazole (PROTONIX) 40 MG tablet TAKE 1 TABLET BY MOUTH EVERY DAY 30 tablet 0  . pravastatin (PRAVACHOL) 20 MG tablet Take 1 tablet (20 mg total) by mouth daily. 90 tablet 1   No current facility-administered medications on file prior to visit.     No Known Allergies  Social History   Socioeconomic History  . Marital status: Married    Spouse name: Not on file  . Number of children: Not on file  . Years of education: Not on file  . Highest education level: Not on file  Occupational History  . Not on file  Social  Needs  . Financial resource strain: Not on file  . Food insecurity:    Worry: Not on file    Inability: Not on file  . Transportation needs:    Medical: Not on file    Non-medical: Not on file  Tobacco Use  . Smoking status: Former Smoker    Packs/day: 2.00    Years: 40.00    Pack years: 80.00    Last attempt to quit: 10/13/2012    Years since quitting: 5.9  . Smokeless tobacco: Never Used  Substance and Sexual Activity  . Alcohol use: No    Comment: fifth daily  . Drug use: No  . Sexual activity: Not on file  Lifestyle  . Physical activity:    Days per week: Not on file    Minutes per session: Not on file  . Stress: Not on  file  Relationships  . Social connections:    Talks on phone: Not on file    Gets together: Not on file    Attends religious service: Not on file    Active member of club or organization: Not on file    Attends meetings of clubs or organizations: Not on file    Relationship status: Not on file  . Intimate partner violence:    Fear of current or ex partner: Not on file    Emotionally abused: Not on file    Physically abused: Not on file    Forced sexual activity: Not on file  Other Topics Concern  . 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 COPD Brother     Past Surgical History:  Procedure Laterality Date  . NO PAST SURGERIES      ROS: Review of Systems  Gastrointestinal: Negative for abdominal pain and blood in stool.  Genitourinary: Negative for difficulty urinating and hematuria.  Psychiatric/Behavioral: Negative for dysphoric mood.    PHYSICAL EXAM: BP (!) 144/69   Pulse 74   Temp 98.1 F (36.7 C) (Oral)   Resp 16   Wt 134 lb 3.2 oz (60.9 kg)   SpO2 97%   BMI 18.72 kg/m   Wt Readings from Last 3 Encounters:  09/20/18 134 lb 3.2 oz (60.9 kg)  05/24/18 133 lb (60.3 kg)  05/21/18 133 lb 9.6 oz (60.6 kg)  BP 144/72  Physical Exam  General appearance - alert, well appearing, and in no distress Mental status -quiet elderly African-American male.  He answers questions appropriately. Mouth - mucous membranes moist, pharynx normal without lesions Neck - supple, no significant adenopathy Lymphatics -no cervical or axillary lymphadenopathy Chest - clear to auscultation, no wheezes, rales or rhonchi, symmetric air entry Heart - normal rate, regular rhythm, normal S1, S2, no murmurs, rubs, clicks or gallops Extremities -no lower extremity edema. Skin: mild  varicose veins on lower legs.  Moderately dry skin with excoriation of legs.  Results for orders placed or performed in visit on 05/21/18  CBC  Result Value Ref Range   WBC 6.2 3.4 - 10.8 x10E3/uL   RBC 3.91 (L) 4.14 - 5.80 x10E6/uL   Hemoglobin 11.1 (L) 13.0 - 17.7 g/dL   Hematocrit 16.1 (L) 09.6 - 51.0 %   MCV 89 79 - 97 fL  MCH 28.4 26.6 - 33.0 pg   MCHC 32.0 31.5 - 35.7 g/dL   RDW 16.1 09.6 - 04.5 %   Platelets 245 150 - 450 x10E3/uL  Comprehensive metabolic panel  Result Value Ref Range   Glucose 80 65 - 99 mg/dL   BUN 17 8 - 27 mg/dL   Creatinine, Ser 4.09 0.76 - 1.27 mg/dL   GFR calc non Af Amer 56 (L) >59 mL/min/1.73   GFR calc Af Amer 65 >59 mL/min/1.73   BUN/Creatinine Ratio 14 10 - 24   Sodium 146 (H) 134 - 144 mmol/L   Potassium 4.5 3.5 - 5.2 mmol/L   Chloride 104 96 - 106 mmol/L   CO2 26 20 - 29 mmol/L   Calcium 9.4 8.6 - 10.2 mg/dL   Total Protein 6.5 6.0 - 8.5 g/dL   Albumin 4.1 3.5 - 4.8 g/dL   Globulin, Total 2.4 1.5 - 4.5 g/dL   Albumin/Globulin Ratio 1.7 1.2 - 2.2   Bilirubin Total 0.2 0.0 - 1.2 mg/dL   Alkaline Phosphatase 73 39 - 117 IU/L   AST 17 0 - 40 IU/L   ALT 10 0 - 44 IU/L  Lipid panel  Result Value Ref Range   Cholesterol, Total 191 100 - 199 mg/dL   Triglycerides 811 0 - 149 mg/dL   HDL 63 >91 mg/dL   VLDL Cholesterol Cal 26 5 - 40 mg/dL   LDL Calculated 478 (H) 0 - 99 mg/dL   Chol/HDL Ratio 3.0 0.0 - 5.0 ratio  Vitamin B12  Result Value Ref Range   Vitamin B-12 539 232 - 1,245 pg/mL    ASSESSMENT AND PLAN: 1. Essential hypertension Not at goal.  Patient has not taken Norvasc as yet for the day.  He will continue current medication at the 5 mg dose.  2. Hyperlipidemia, unspecified hyperlipidemia type Continue Pravachol.  3. Anemia, unspecified type I went over blood test from last visit that showed mild stable anemia.  4. Dry skin dermatitis Recommend using a moisturizing lotion like Curel which can be purchased  over-the-counter.  5. Dementia due to Alzheimer's disease [facility]) Patient with decrease in MMSE score from last year.  However, he still seems to be able to function around the home and is independent in his basic ADLs.  6. Need for Tdap vaccination Given today. I have updated his health maintenance to reflect that he received the flu vaccine and first shot of Shingrix last month at his outside pharmacy.   Patient was given the opportunity to ask questions.  Patient verbalized understanding of the plan and was able to repeat key elements of the plan.   No orders of the defined types were placed in this encounter.    Requested Prescriptions    No prescriptions requested or ordered in this encounter    No follow-ups on file.  [name], MD, FACP

## 2018-09-20 NOTE — Patient Instructions (Addendum)
Purchase Curel lotion from over-the-counter and use twice a day for dry skin on the legs.  Your blood pressure today is slightly elevated.  Please take your blood pressure medicine when you return home.  Td Vaccine (Tetanus and Diphtheria): What You Need to Know 1. Why get vaccinated? Tetanus  and diphtheria are very serious diseases. They are rare in the [place] today, but people who do become infected often have severe complications. Td vaccine is used to protect adolescents and adults from both of these diseases. Both tetanus and diphtheria are infections caused by bacteria. Diphtheria spreads from person to person through coughing or sneezing. Tetanus-causing bacteria enter the body through cuts, scratches, or wounds. TETANUS (lockjaw) causes painful muscle tightening and stiffness, usually all over the body.  It can lead to tightening of muscles in the head and neck so you can't open your mouth, swallow, or sometimes even breathe. Tetanus kills about 1 out of every 10 people who are infected even after receiving the best medical care.  DIPHTHERIA can cause a thick coating to form in the back of the throat.  It can lead to breathing problems, paralysis, heart failure, and death.  Before vaccines, as many as 200,000 cases of diphtheria and hundreds of cases of tetanus were reported in the [place] each year. Since vaccination began, reports of cases for both diseases have dropped by about 99%. 2. Td vaccine Td vaccine can protect adolescents and adults from tetanus and diphtheria. Td is usually given as a booster dose every 10 years but it can also be given earlier after a severe and dirty wound or burn. Another vaccine, called Tdap, which protects against pertussis in addition to tetanus and diphtheria, is sometimes recommended instead of Td vaccine. Your doctor or the person giving you the vaccine can give you more information. Td may safely be given at the same time as other  vaccines. 3. Some people should not get this vaccine  A person who has ever had a life-threatening allergic reaction after a previous dose of any tetanus or diphtheria containing vaccine, OR has a severe allergy to any part of this vaccine, should not get Td vaccine. Tell the person giving the vaccine about any severe allergies.  Talk to your doctor if you: ? had severe pain or swelling after any vaccine containing diphtheria or tetanus, ? ever had a condition called Guillain Barre Syndrome (GBS), ? aren't feeling well on the day the shot is scheduled. 4. What are the risks from Td vaccine? With any medicine, including vaccines, there is a chance of side effects. These are usually mild and go away on their own. Serious reactions are also possible but are rare. Most people who get Td vaccine do not have any problems with it. Mild problems following Td vaccine: (Did not interfere with activities)  Pain where the shot was given (about 8 people in 10)  Redness or swelling where the shot was given (about 1 person in 4)  Mild fever (rare)  Headache (about 1 person in 4)  Tiredness (about 1 person in 4)  Moderate problems following Td vaccine: (Interfered with activities, but did not require medical attention)  Fever over 102F (rare)  Severe problems following Td vaccine: (Unable to perform usual activities; required medical attention)  Swelling, severe pain, bleeding and/or redness in the arm where the shot was given (rare).  Problems that could happen after any vaccine:  People sometimes faint after a medical procedure, including vaccination. Sitting or  lying down for about 15 minutes can help prevent fainting, and injuries caused by a fall. Tell your doctor if you feel dizzy, or have vision changes or ringing in the ears.  Some people get severe pain in the shoulder and have difficulty moving the arm where a shot was given. This happens very rarely.  Any medication can cause a  severe allergic reaction. Such reactions from a vaccine are very rare, estimated at fewer than 1 in a million doses, and would happen within a few minutes to a few hours after the vaccination. As with any medicine, there is a very remote chance of a vaccine causing a serious injury or death. The safety of vaccines is always being monitored. For more information, visit: http://floyd.org/www.cdc.gov/vaccinesafety/ 5. What if there is a serious reaction? What should I look for? Look for anything that concerns you, such as signs of a severe allergic reaction, very high fever, or unusual behavior. Signs of a severe allergic reaction can include hives, swelling of the face and throat, difficulty breathing, a fast heartbeat, dizziness, and weakness. These would usually start a few minutes to a few hours after the vaccination. What should I do?  If you think it is a severe allergic reaction or other emergency that can't wait, call 9-1-1 or get the person to the nearest hospital. Otherwise, call your doctor.  Afterward, the reaction should be reported to the Vaccine Adverse Event Reporting System (VAERS). Your doctor might file this report, or you can do it yourself through the VAERS web site at www.vaers.LAgents.nohhs.gov, or by calling 1-(239)560-2836. ? VAERS does not give medical advice. 6. The National Vaccine Injury Compensation Program The [employer] Vaccine Injury Compensation Program (VICP) is a federal program that was created to compensate people who may have been injured by certain vaccines. Persons who believe they may have been injured by a vaccine can learn about the program and about filing a claim by calling 1-(234) 378-5656 or visiting the VICP website at SpiritualWord.atwww.hrsa.gov/vaccinecompensation. There is a time limit to file a claim for compensation. 7. How can I learn more?  Ask your doctor. He or she can give you the vaccine package insert or suggest other sources of information.  Call your local or state health  department.  Contact the Centers for Disease Control and Prevention (CDC): ? Call (908)404-31701-825-421-1624 (1-800-CDC-INFO) ? Visit CDC's website at PicCapture.uywww.cdc.gov/vaccines CDC Td Vaccine VIS (01/22/16) This information is not intended to replace advice given to you by your health care provider. Make sure you discuss any questions you have with your health care provider. Document Released: 07/27/2006 Document Revised: 06/19/2016 Document Reviewed: 06/19/2016 Elsevier Interactive Patient Education  2017 [employer]

## 2018-10-27 ENCOUNTER — Other Ambulatory Visit: Payer: Self-pay | Admitting: Family Medicine

## 2018-11-17 ENCOUNTER — Other Ambulatory Visit: Payer: Self-pay | Admitting: Internal Medicine

## 2018-11-17 DIAGNOSIS — E785 Hyperlipidemia, unspecified: Secondary | ICD-10-CM

## 2018-11-17 DIAGNOSIS — I10 Essential (primary) hypertension: Secondary | ICD-10-CM

## 2018-12-22 DIAGNOSIS — Z961 Presence of intraocular lens: Secondary | ICD-10-CM | POA: Diagnosis not present

## 2018-12-22 DIAGNOSIS — H401233 Low-tension glaucoma, bilateral, severe stage: Secondary | ICD-10-CM | POA: Diagnosis not present

## 2019-01-20 ENCOUNTER — Ambulatory Visit: Payer: Self-pay | Admitting: Internal Medicine

## 2019-01-23 ENCOUNTER — Other Ambulatory Visit: Payer: Self-pay | Admitting: Family Medicine

## 2019-01-25 ENCOUNTER — Encounter: Payer: Self-pay | Admitting: Internal Medicine

## 2019-01-25 ENCOUNTER — Other Ambulatory Visit: Payer: Self-pay

## 2019-01-25 ENCOUNTER — Ambulatory Visit: Payer: Medicare Other | Attending: Internal Medicine | Admitting: Internal Medicine

## 2019-01-25 DIAGNOSIS — F0280 Dementia in other diseases classified elsewhere without behavioral disturbance: Secondary | ICD-10-CM

## 2019-01-25 DIAGNOSIS — I10 Essential (primary) hypertension: Secondary | ICD-10-CM | POA: Diagnosis not present

## 2019-01-25 DIAGNOSIS — E785 Hyperlipidemia, unspecified: Secondary | ICD-10-CM | POA: Diagnosis not present

## 2019-01-25 DIAGNOSIS — G309 Alzheimer's disease, unspecified: Secondary | ICD-10-CM

## 2019-01-25 DIAGNOSIS — E538 Deficiency of other specified B group vitamins: Secondary | ICD-10-CM | POA: Diagnosis not present

## 2019-01-25 NOTE — Progress Notes (Signed)
Virtual Visit via Telephone Note  I connected with Brandon Taylor on 01/25/19 at 3:52 p.m by telephone from my office and verified that I am speaking with the correct person using two identifiers.  Patient is at home.  His line was placed on speaker phone and his wife shared in on this encounter providing some of the history.     I discussed the limitations, risks, security and privacy concerns of performing an evaluation and management service by telephone and the availability of in person appointments. I also discussed with the patient that there may be a patient responsible charge related to this service. The patient expressed understanding and agreed to proceed.   History of Present Illness: Pt with hx of GERD, ETOH use disorder, vit B12 def, HL, HTN, mild dementia.  Patient was last seen in December.   Dementia: Patient reports no major issues at this time.  He states that he is sleeping well and has good appetite.  He remains independent in ADLs.  His wife verified that this information is correct.  Wife reports that overall he does okay with no major safety concerns except for 1 time he forgot and left a piece of wood on the stove.  He has not had any incidence of wandering off outside the house and getting lost.  HTN: reports compliance with Norvasc.  Does not check blood pressure No CP/SOB/HA/dizziness.  Occasional lower extremity edema.  Vitamin B12 deficiency: Reports that he is taking the B12 supplement.  Denies any numbness in the extremities.  HL:  Tolerating Pravachol  Observations/Objective: This was a telephone encounter.  No direct observation done.   Assessment and Plan: 1. Essential hypertension No device to check blood pressure.  Continue amlodipine and low-salt diet.  2. Dementia due to Alzheimer's disease (HCC) Overall patient still functional at home.  He has good support of his wife.  I gave some advice on safety recommending that he not be allowed to use the  stove.  3. Vitamin B12 deficiency Continue supplement  4. Hyperlipidemia, unspecified hyperlipidemia type Tolerating Pravachol.  On follow-up visit we may consider discontinuing this due to his age.   Follow Up Instructions: F/u in 3 mths   I discussed the assessment and treatment plan with the patient. The patient was provided an opportunity to ask questions and all were answered. The patient agreed with the plan and demonstrated an understanding of the instructions.   The patient was advised to call back or seek an in-person evaluation if the symptoms worsen or if the condition fails to improve as anticipated.  I provided 9 minutes of non-face-to-face time during this encounter.   [name], MD

## 2019-01-25 NOTE — Progress Notes (Signed)
Does not have any concerns -

## 2019-02-15 ENCOUNTER — Other Ambulatory Visit: Payer: Self-pay | Admitting: Internal Medicine

## 2019-02-15 DIAGNOSIS — I10 Essential (primary) hypertension: Secondary | ICD-10-CM

## 2019-02-16 ENCOUNTER — Other Ambulatory Visit: Payer: Self-pay | Admitting: Internal Medicine

## 2019-02-16 DIAGNOSIS — E785 Hyperlipidemia, unspecified: Secondary | ICD-10-CM

## 2019-04-18 ENCOUNTER — Other Ambulatory Visit: Payer: Self-pay | Admitting: Internal Medicine

## 2019-05-23 ENCOUNTER — Other Ambulatory Visit: Payer: Self-pay

## 2019-05-23 ENCOUNTER — Telehealth: Payer: Medicare Other | Admitting: Neurology

## 2019-07-11 DIAGNOSIS — R3129 Other microscopic hematuria: Secondary | ICD-10-CM | POA: Diagnosis not present

## 2019-07-11 DIAGNOSIS — N200 Calculus of kidney: Secondary | ICD-10-CM | POA: Diagnosis not present

## 2019-07-13 DIAGNOSIS — H401233 Low-tension glaucoma, bilateral, severe stage: Secondary | ICD-10-CM | POA: Diagnosis not present

## 2019-08-06 ENCOUNTER — Other Ambulatory Visit: Payer: Self-pay | Admitting: Internal Medicine

## 2019-08-06 DIAGNOSIS — I10 Essential (primary) hypertension: Secondary | ICD-10-CM

## 2019-08-06 DIAGNOSIS — E785 Hyperlipidemia, unspecified: Secondary | ICD-10-CM

## 2019-08-29 ENCOUNTER — Encounter (HOSPITAL_COMMUNITY): Payer: Self-pay | Admitting: Emergency Medicine

## 2019-08-29 ENCOUNTER — Other Ambulatory Visit: Payer: Self-pay

## 2019-08-29 ENCOUNTER — Ambulatory Visit (HOSPITAL_COMMUNITY)
Admission: EM | Admit: 2019-08-29 | Discharge: 2019-08-29 | Disposition: A | Discharge status: Home / Self Care | Payer: Medicare Other | Attending: Internal Medicine | Admitting: Internal Medicine

## 2019-08-29 DIAGNOSIS — R310 Gross hematuria: Secondary | ICD-10-CM | POA: Diagnosis not present

## 2019-08-29 NOTE — ED Provider Notes (Signed)
[facility]    CSN: [id number] Arrival date & time: 08/29/19  1725      History   Chief Complaint Chief Complaint  Patient presents with  . Hematuria    HPI Brandon Taylor is a 78 y.o. male with remote history of alcohol abuse comes to urgent care after he was noted to have bloody urine.  Patient is accompanied by family member.  Patient is unable to give a timeline for the bloody urine.  Family member noticed it recommended follow-up in the urgent care.  Patient denies any dysuria, urgency or frequency.  He also denies any hesitancy, feeling of incomplete bladder emptying or post void dribbling.  He wakes up once in the bathroom at night.  He confirmed losing weight but is not sure how much weight he is lost.  Patient was recently evaluated by his urologist and assessed to be doing well Past Medical History:  Diagnosis Date  . ETOH abuse   . GERD (gastroesophageal reflux disease)     Patient Active Problem List   Diagnosis Date Noted  . Mixed hyperlipidemia 07/20/2017  . Candidate for statin therapy due to risk of future cardiovascular event 07/20/2017  . Mild dementia (HCC) 05/29/2017  . AKI (acute kidney injury) (HCC) 02/17/2017  . Surgical site reaction 12/06/2015  . Routine general medical examination at a health care facility 10/24/2014  . Vitamin B12 deficiency 10/03/2013  . Loss of weight 10/03/2013  . Dysphagia, unspecified(787.20) 10/03/2013  . Alcohol abuse 10/03/2013  . Nicotine abuse 10/03/2013  . Hypoglycemia 09/22/2013  . General weakness 09/22/2013  . SIRS (systemic inflammatory response syndrome) (HCC) 09/22/2013  . GERD (gastroesophageal reflux disease)   . ETOH abuse     Past Surgical History:  Procedure Laterality Date  . NO PAST SURGERIES         Home Medications    Prior to Admission medications   Medication Sig Start Date End Date Taking? Authorizing Provider  amLODipine (NORVASC) 5 MG tablet Take 1 tablet (5 mg total) by mouth  daily. Must have office visit for refills 08/08/19  Yes Marcine Matar, MD  ASPIRIN ADULT LOW STRENGTH 81 MG EC tablet TAKE 1 TABLET BY MOUTH EVERY DAY 04/19/19  Yes Marcine Matar, MD  cyanocobalamin ([facility] VITAMIN B12) 1000 MCG tablet TAKE 1 TABLET BY MOUTH EVERY Day 05/21/18  Yes Marcine Matar, MD  Multiple Vitamin (MULTIVITAMIN WITH MINERALS) TABS tablet Take 1 tablet by mouth daily. 04/10/17  Yes Arrie Senate R, FNP  pantoprazole (PROTONIX) 40 MG tablet TAKE 1 TABLET BY MOUTH EVERY DAY 08/18/18  Yes Marcine Matar, MD  pravastatin (PRAVACHOL) 20 MG tablet Take 1 tablet (20 mg total) by mouth daily. Must have office visit for refills 08/08/19  Yes Marcine Matar, MD  feeding supplement, ENSURE ENLIVE, (ENSURE ENLIVE) LIQD Take 237 mLs by mouth 2 (two) times daily between meals. 02/19/17   Arnetha Courser, MD  Tdap Leda Min) 5-2.5-18.5 LF-MCG/0.5 injection Inject 0.5 mLs into the muscle as directed. 09/20/18   Marcine Matar, MD    Family History Family History  Problem Relation Age of Onset  . COPD Brother     Social History Social History   Tobacco Use  . Smoking status: Former Smoker    Packs/day: 2.00    Years: 40.00    Pack years: 80.00    Quit date: 10/13/2012    Years since quitting: 6.8  . Smokeless tobacco: Never Used  Substance Use Topics  .  Alcohol use: Not Currently    Comment: fifth daily  . Drug use: No     Allergies   Patient has no known allergies.   Review of Systems Review of Systems  Constitutional: Negative.   Gastrointestinal: Negative.  Negative for nausea and vomiting.  Genitourinary: Positive for hematuria. Negative for dysuria, penile pain, penile swelling, scrotal swelling, testicular pain and urgency.  Musculoskeletal: Negative.  Negative for arthralgias and back pain.  Skin: Negative.  Negative for rash and wound.  Neurological: Negative.   Psychiatric/Behavioral: Positive for confusion. Negative for decreased  concentration.     Physical Exam Triage Vital Signs ED Triage Vitals  Enc Vitals Group     BP 08/29/19 1914 138/76     Pulse Rate 08/29/19 1914 79     Resp 08/29/19 1914 18     Temp 08/29/19 1914 98.5 F (36.9 C)     Temp Source 08/29/19 1914 Oral     SpO2 08/29/19 1914 100 %     Weight --      Height --      Head Circumference --      Peak Flow --      Pain Score 08/29/19 1911 0     Pain Loc --      Pain Edu? --      Excl. in [place]? --    No data found.  Updated Vital Signs BP 138/76 (BP Location: Right Arm)   Pulse 79   Temp 98.5 F (36.9 C) (Oral)   Resp 18   SpO2 100%   Visual Acuity Right Eye Distance:   Left Eye Distance:   Bilateral Distance:    Right Eye Near:   Left Eye Near:    Bilateral Near:     Physical Exam Vitals signs and nursing note reviewed.  Constitutional:      General: He is not in acute distress.    Appearance: Normal appearance. He is ill-appearing. He is not toxic-appearing or diaphoretic.     Comments: Signs of weight loss  HENT:     Right Ear: Tympanic membrane normal.     Left Ear: Tympanic membrane normal.  Cardiovascular:     Rate and Rhythm: Normal rate and regular rhythm.     Pulses: Normal pulses.     Heart sounds: No murmur. No friction rub.  Pulmonary:     Effort: Pulmonary effort is normal. No respiratory distress.     Breath sounds: No rhonchi or rales.  Abdominal:     General: Bowel sounds are normal.     Palpations: Abdomen is soft.     Tenderness: There is no abdominal tenderness. There is no rebound.     Hernia: No hernia is present.  Musculoskeletal: Normal range of motion.        General: No swelling or signs of injury.  Skin:    General: Skin is warm.     Capillary Refill: Capillary refill takes less than 2 seconds.  Neurological:     General: No focal deficit present.     Mental Status: He is alert and oriented to person, place, and time.      UC Treatments / Results  Labs (all labs ordered are  listed, but only abnormal results are displayed) Labs Reviewed  URINE CULTURE - Abnormal; Notable for the following components:      Result Value   Culture   (*)    Value: >=100,000 COLONIES/mL GRAM NEGATIVE RODS IDENTIFICATION AND SUSCEPTIBILITIES TO FOLLOW Performed  at [facility], [address] [place], [place] [address]    All other components within normal limits    EKG   Radiology No results found.  Procedures Procedures (including critical care time)  Medications Ordered in UC Medications - No data to display  Initial Impression / Assessment and Plan / UC Course  I have reviewed the triage vital signs and the nursing notes.  Pertinent labs & imaging results that were available during my care of the patient were reviewed by me and considered in my medical decision making (see chart for details).     1.  Hematuria without dysuria: Point-of-care urinalysis was impossible because of the nature of the sample Urine cultures were sent No antibiotics started at this time Patient is encouraged to reach out to the urologist for reevaluation Last PSA level was 1.4 Review of records did not reveal recurrent UTIs If urine culture results are abnormal, patient will be called with appropriate treatment. Final Clinical Impressions(s) / UC Diagnoses   Final diagnoses:  Gross hematuria   Discharge Instructions   None    ED Prescriptions    None     PDMP not reviewed this encounter.   Merrilee JanskyLamptey, Philip O, MD 08/31/19 [phone]

## 2019-08-29 NOTE — ED Triage Notes (Signed)
Wife has noticed blood in urine for 2 days.  Patient denies pain

## 2019-08-29 NOTE — ED Notes (Signed)
Unable to run UA due to urine quality

## 2019-08-31 DIAGNOSIS — R310 Gross hematuria: Secondary | ICD-10-CM | POA: Diagnosis not present

## 2019-09-01 LAB — URINE CULTURE
Culture: >=100000 — AB
Special Requests: NORMAL

## 2019-09-02 ENCOUNTER — Telehealth: Payer: Self-pay | Admitting: Emergency Medicine

## 2019-09-02 MED ORDER — CEPHALEXIN 500 MG PO CAPS: 500.0000 mg | ORAL_CAPSULE | Freq: Four times a day (QID) | ORAL | 0 refills | Status: AC

## 2019-09-02 NOTE — Telephone Encounter (Signed)
Per natalie, pt needs keflex 500mg  oral 4 times a day x 10 days, spoke to wife on the phone, per record, okay to disclose all health information to wife. Sent medication to preferred pharmacy, all questions answered.

## 2019-09-09 ENCOUNTER — Other Ambulatory Visit: Payer: Self-pay | Admitting: Internal Medicine

## 2019-09-09 DIAGNOSIS — I10 Essential (primary) hypertension: Secondary | ICD-10-CM

## 2019-09-09 DIAGNOSIS — E785 Hyperlipidemia, unspecified: Secondary | ICD-10-CM

## 2019-09-12 ENCOUNTER — Telehealth: Payer: Self-pay | Admitting: Internal Medicine

## 2019-09-12 NOTE — Telephone Encounter (Signed)
Made in error

## 2019-09-13 DIAGNOSIS — R310 Gross hematuria: Secondary | ICD-10-CM | POA: Diagnosis not present

## 2019-09-13 DIAGNOSIS — N2889 Other specified disorders of kidney and ureter: Secondary | ICD-10-CM | POA: Diagnosis not present

## 2019-09-23 ENCOUNTER — Ambulatory Visit: Payer: Medicare Other | Attending: Internal Medicine | Admitting: Internal Medicine

## 2019-09-23 ENCOUNTER — Encounter: Payer: Self-pay | Admitting: Internal Medicine

## 2019-09-23 ENCOUNTER — Other Ambulatory Visit: Payer: Self-pay

## 2019-09-23 DIAGNOSIS — E538 Deficiency of other specified B group vitamins: Secondary | ICD-10-CM | POA: Diagnosis not present

## 2019-09-23 DIAGNOSIS — I10 Essential (primary) hypertension: Secondary | ICD-10-CM | POA: Diagnosis not present

## 2019-09-23 DIAGNOSIS — G309 Alzheimer's disease, unspecified: Secondary | ICD-10-CM | POA: Diagnosis not present

## 2019-09-23 DIAGNOSIS — R310 Gross hematuria: Secondary | ICD-10-CM | POA: Diagnosis not present

## 2019-09-23 DIAGNOSIS — F0280 Dementia in other diseases classified elsewhere without behavioral disturbance: Secondary | ICD-10-CM

## 2019-09-23 DIAGNOSIS — D649 Anemia, unspecified: Secondary | ICD-10-CM

## 2019-09-23 DIAGNOSIS — E785 Hyperlipidemia, unspecified: Secondary | ICD-10-CM | POA: Diagnosis not present

## 2019-09-23 MED ORDER — PRAVASTATIN SODIUM 20 MG PO TABS: 20.0000 mg | ORAL_TABLET | Freq: Every day | ORAL | 6 refills | Status: DC

## 2019-09-23 MED ORDER — AMLODIPINE BESYLATE 5 MG PO TABS: 5.0000 mg | ORAL_TABLET | Freq: Every day | ORAL | 12 refills | Status: DC

## 2019-09-23 NOTE — Progress Notes (Addendum)
Virtual Visit via Telephone Note Due to current restrictions/limitations of in-office visits due to the COVID-19 pandemic, this scheduled clinical appointment was converted to a telehealth visit  I connected with Brandon Taylor on 09/23/19 at 2:28 p.m by telephone and verified that I am speaking with the correct person using two identifiers. I am in my office.  The patient is at home.  Only the patient, his wife, myself participated in this encounter.  I discussed the limitations, risks, security and privacy concerns of performing an evaluation and management service by telephone and the availability of in person appointments. I also discussed with the patient that there may be a patient responsible charge related to this service. The patient expressed understanding and agreed to proceed.   History of Present Illness: Pt with hx of GERD, ETOH use disorder, vit B12 def, HL, HTN, mild dementia.  Last evaluation was 01/2019.  Purpose of today's visit is chronic disease management.  Pt seen in ER 08/29/2019 for gross hematuria.  UTI on urine culture. Subsequently saw urologist, Dr. Alyson Ingles and had work-up done including CAT scan which his wife said came back okay.  He has not had any further hematuria.    HTN: no device to check BP.  He will limit salt in the foods.  Wife tells me that blood pressure was checked when he went to see the urologist and it was good.  I see blood pressure done in the emergency room on 08/29/2019 and it was 138/76. No CP/SOB/LE edema/dizziness/HA  HL:  Tolerating Pravachol.  Denies any muscle cramps  Dementia: Wife tells me that patient has not had any significant decline or any behavioral issues since we last spoke.  No safety issues at home.  He has not had any falls.  He remains independent in feeding, bathing, clothing and using the toilet.   HM:  Had flu vaccine late Oct at [place] Encounter Medications as of 09/23/2019  Medication Sig  . amLODipine  (NORVASC) 5 MG tablet Take 1 tablet (5 mg total) by mouth daily. Must have office visit for refills  . ASPIRIN ADULT LOW STRENGTH 81 MG EC tablet TAKE 1 TABLET BY MOUTH EVERY DAY  . cyanocobalamin ([employer] VITAMIN B12) 1000 MCG tablet TAKE 1 TABLET BY MOUTH EVERY Day  . feeding supplement, ENSURE ENLIVE, (ENSURE ENLIVE) LIQD Take 237 mLs by mouth 2 (two) times daily between meals.  . Multiple Vitamin (MULTIVITAMIN WITH MINERALS) TABS tablet Take 1 tablet by mouth daily.  . pantoprazole (PROTONIX) 40 MG tablet TAKE 1 TABLET BY MOUTH EVERY DAY  . pravastatin (PRAVACHOL) 20 MG tablet Take 1 tablet (20 mg total) by mouth daily. Must have office visit for refills  . Tdap (BOOSTRIX) 5-2.5-18.5 LF-MCG/0.5 injection Inject 0.5 mLs into the muscle as directed.   No facility-administered encounter medications on file as of 09/23/2019.    Observations/Objective: No direct observation done as this was a telephone encounter  Assessment and Plan: 1. Essential hypertension Blood pressure from ER was slightly above 130/80 but acceptable given patient's age.  Continue current dose of Norvasc.  Wife will bring him to the lab next week to have his blood test done. - CBC; Future - Comprehensive metabolic panel; Future - amLODipine (NORVASC) 5 MG tablet; Take 1 tablet (5 mg total) by mouth daily. Must have office visit for refills  Dispense: 30 tablet; Refill: 12  2. Hyperlipidemia, unspecified hyperlipidemia type - Lipid panel; Future - pravastatin (PRAVACHOL) 20 MG tablet; Take 1 tablet (20  mg total) by mouth daily. Must have office visit for refills  Dispense: 30 tablet; Refill: 6  3. Dementia due to Alzheimer's disease [facility]) Patient stable.  Wife reports no major concerns at this time in terms of safety or behavioral issues.  He is still independent in some of his activities of daily living  4. Vitamin B12 deficiency Continue B12 supplement which he gets over-the-counter. - Vitamin B12; Future  5. Gross  hematuria -Urology work-up per patient's wife was negative.  Follow Up Instructions: 4 mths   I discussed the assessment and treatment plan with the patient. The patient was provided an opportunity to ask questions and all were answered. The patient agreed with the plan and demonstrated an understanding of the instructions.   The patient was advised to call back or seek an in-person evaluation if the symptoms worsen or if the condition fails to improve as anticipated.  Addendum 09/28/2019:  Pt with mild stable anemia on recent blood test.  Will add iron studies.  Up to date with colon cancer screening. Pt with worsening eGFR.  See lab note sent to [place].  Plan to recheck on f/u visit.  I provided 14 minutes of non-face-to-face time during this encounter.   Karle Plumber, MD

## 2019-09-26 ENCOUNTER — Other Ambulatory Visit: Payer: Self-pay

## 2019-09-26 ENCOUNTER — Ambulatory Visit: Payer: Medicare Other | Attending: Internal Medicine

## 2019-09-26 DIAGNOSIS — D649 Anemia, unspecified: Secondary | ICD-10-CM | POA: Diagnosis not present

## 2019-09-26 DIAGNOSIS — I10 Essential (primary) hypertension: Secondary | ICD-10-CM | POA: Diagnosis not present

## 2019-09-26 DIAGNOSIS — E538 Deficiency of other specified B group vitamins: Secondary | ICD-10-CM | POA: Diagnosis not present

## 2019-09-26 DIAGNOSIS — E785 Hyperlipidemia, unspecified: Secondary | ICD-10-CM

## 2019-09-27 LAB — CBC
Hematocrit: 34.3 % — ABNORMAL LOW (ref 37.5–51.0)
Hemoglobin: 11.9 g/dL — ABNORMAL LOW (ref 13.0–17.7)
MCH: 30.3 pg (ref 26.6–33.0)
MCHC: 34.7 g/dL (ref 31.5–35.7)
MCV: 87 fL (ref 79–97)
Platelets: 232 10*3/uL (ref 150–450)
RBC: 3.93 x10E6/uL — ABNORMAL LOW (ref 4.14–5.80)
RDW: 12.3 % (ref 11.6–15.4)
WBC: 5 10*3/uL (ref 3.4–10.8)

## 2019-09-27 LAB — COMPREHENSIVE METABOLIC PANEL
ALT: 8 IU/L (ref 0–44)
AST: 17 IU/L (ref 0–40)
Albumin/Globulin Ratio: 1.7 (ref 1.2–2.2)
Albumin: 4.2 g/dL (ref 3.7–4.7)
Alkaline Phosphatase: 83 IU/L (ref 39–117)
BUN/Creatinine Ratio: 13 (ref 10–24)
BUN: 20 mg/dL (ref 8–27)
Bilirubin Total: 0.3 mg/dL (ref 0.0–1.2)
CO2: 27 mmol/L (ref 20–29)
Calcium: 9.8 mg/dL (ref 8.6–10.2)
Chloride: 104 mmol/L (ref 96–106)
Creatinine, Ser: 1.51 mg/dL — ABNORMAL HIGH (ref 0.76–1.27)
GFR calc Af Amer: 50 mL/min/{1.73_m2} — ABNORMAL LOW (ref >59)
GFR calc non Af Amer: 43 mL/min/{1.73_m2} — ABNORMAL LOW (ref >59)
Globulin, Total: 2.5 g/dL (ref 1.5–4.5)
Glucose: 73 mg/dL (ref 65–99)
Potassium: 4.2 mmol/L (ref 3.5–5.2)
Sodium: 144 mmol/L (ref 134–144)
Total Protein: 6.7 g/dL (ref 6.0–8.5)

## 2019-09-27 LAB — VITAMIN B12: Vitamin B-12: 783 pg/mL (ref 232–1245)

## 2019-09-27 LAB — LIPID PANEL
Chol/HDL Ratio: 2.4 ratio (ref 0.0–5.0)
Cholesterol, Total: 187 mg/dL (ref 100–199)
HDL: 77 mg/dL (ref >39)
LDL Chol Calc (NIH): 98 mg/dL (ref 0–99)
Triglycerides: 61 mg/dL (ref 0–149)
VLDL Cholesterol Cal: 12 mg/dL (ref 5–40)

## 2019-09-28 ENCOUNTER — Encounter: Payer: Self-pay | Admitting: Internal Medicine

## 2019-09-28 ENCOUNTER — Other Ambulatory Visit: Payer: Self-pay | Admitting: Internal Medicine

## 2019-09-28 DIAGNOSIS — D638 Anemia in other chronic diseases classified elsewhere: Secondary | ICD-10-CM | POA: Insufficient documentation

## 2019-09-28 DIAGNOSIS — N289 Disorder of kidney and ureter, unspecified: Secondary | ICD-10-CM | POA: Insufficient documentation

## 2019-09-28 DIAGNOSIS — K219 Gastro-esophageal reflux disease without esophagitis: Secondary | ICD-10-CM

## 2019-09-28 NOTE — Addendum Note (Signed)
Addended by: Karle Plumber B on: 09/28/2019 11:39 AM   Modules accepted: Orders

## 2019-10-01 ENCOUNTER — Encounter: Payer: Self-pay | Admitting: Internal Medicine

## 2019-10-01 LAB — IRON,TIBC AND FERRITIN PANEL
Ferritin: 210 ng/mL (ref 30–400)
Iron Saturation: 24 % (ref 15–55)
Iron: 65 ug/dL (ref 38–169)
Total Iron Binding Capacity: 266 ug/dL (ref 250–450)
UIBC: 201 ug/dL (ref 111–343)

## 2019-10-01 LAB — SPECIMEN STATUS REPORT

## 2019-10-22 ENCOUNTER — Other Ambulatory Visit: Payer: Self-pay | Admitting: Internal Medicine

## 2019-10-22 DIAGNOSIS — K219 Gastro-esophageal reflux disease without esophagitis: Secondary | ICD-10-CM

## 2019-11-09 ENCOUNTER — Other Ambulatory Visit: Payer: Self-pay | Admitting: Internal Medicine

## 2019-11-09 DIAGNOSIS — K219 Gastro-esophageal reflux disease without esophagitis: Secondary | ICD-10-CM

## 2019-11-25 ENCOUNTER — Ambulatory Visit: Payer: Medicare Other | Attending: Internal Medicine

## 2019-11-25 DIAGNOSIS — Z23 Encounter for immunization: Secondary | ICD-10-CM

## 2019-11-25 NOTE — Progress Notes (Signed)
   Covid-19 Vaccination Clinic  Name:  MAKS CAVALLERO    MRN: [id number] DOB: 1941/07/15  11/25/2019  Mr. Orrego was observed post Covid-19 immunization for 15 minutes without incidence. He was provided with Vaccine Information Sheet and instruction to access the V-Safe system.   Mr. Iversen was instructed to call 911 with any severe reactions post vaccine: Marland Kitchen Difficulty breathing  . Swelling of your face and throat  . A fast heartbeat  . A bad rash all over your body  . Dizziness and weakness    Immunizations Administered    Name Date Dose VIS Date Route   Moderna COVID-19 Vaccine 11/25/2019 11:10 AM 0.5 mL 09/13/2019 Intramuscular   Manufacturer: Moderna   Lot: [id number]   NDC: 88301-415-97

## 2019-12-26 ENCOUNTER — Ambulatory Visit: Payer: Medicare Other | Attending: Internal Medicine

## 2019-12-26 DIAGNOSIS — Z23 Encounter for immunization: Secondary | ICD-10-CM

## 2019-12-26 NOTE — Progress Notes (Signed)
   Covid-19 Vaccination Clinic  Name:  Brandon Taylor    MRN: [id number] DOB: January 28, 1941  12/26/2019  [name] was observed post Covid-19 immunization for 15 minutes without incident. He was provided with Vaccine Information Sheet and instruction to access the V-Safe system.   [name] was instructed to call 911 with any severe reactions post vaccine: Marland Kitchen Difficulty breathing  . Swelling of face and throat  . A fast heartbeat  . A bad rash all over body  . Dizziness and weakness   Immunizations Administered    Name Date Dose VIS Date Route   Moderna COVID-19 Vaccine 12/26/2019 10:50 AM 0.5 mL 09/13/2019 Intramuscular   Manufacturer: Moderna   Lot: [id number]   NDC: 91504-136-43

## 2020-01-10 DIAGNOSIS — Z961 Presence of intraocular lens: Secondary | ICD-10-CM | POA: Diagnosis not present

## 2020-01-10 DIAGNOSIS — H401233 Low-tension glaucoma, bilateral, severe stage: Secondary | ICD-10-CM | POA: Diagnosis not present

## 2020-02-06 ENCOUNTER — Other Ambulatory Visit: Payer: Self-pay | Admitting: Internal Medicine

## 2020-02-06 DIAGNOSIS — K219 Gastro-esophageal reflux disease without esophagitis: Secondary | ICD-10-CM

## 2020-02-25 ENCOUNTER — Encounter (HOSPITAL_COMMUNITY): Payer: Self-pay | Admitting: Emergency Medicine

## 2020-02-25 ENCOUNTER — Emergency Department (HOSPITAL_COMMUNITY)
Admission: EM | Admit: 2020-02-25 | Discharge: 2020-02-25 | Disposition: A | Discharge status: Home / Self Care | Payer: Medicare Other | Attending: Emergency Medicine | Admitting: Emergency Medicine

## 2020-02-25 DIAGNOSIS — R55 Syncope and collapse: Secondary | ICD-10-CM

## 2020-02-25 DIAGNOSIS — D649 Anemia, unspecified: Secondary | ICD-10-CM | POA: Diagnosis not present

## 2020-02-25 DIAGNOSIS — F1010 Alcohol abuse, uncomplicated: Secondary | ICD-10-CM | POA: Insufficient documentation

## 2020-02-25 LAB — URINALYSIS, ROUTINE W REFLEX MICROSCOPIC
Bilirubin Urine: NEGATIVE
Glucose, UA: NEGATIVE mg/dL
Ketones, ur: NEGATIVE mg/dL
Leukocytes,Ua: NEGATIVE
Nitrite: NEGATIVE
Protein, ur: NEGATIVE mg/dL
Specific Gravity, Urine: 1.005 (ref 1.005–1.030)
pH: 6 (ref 5.0–8.0)

## 2020-02-25 LAB — BASIC METABOLIC PANEL
Anion gap: 10 (ref 5–15)
BUN: 16 mg/dL (ref 8–23)
CO2: 27 mmol/L (ref 22–32)
Calcium: 9 mg/dL (ref 8.9–10.3)
Chloride: 103 mmol/L (ref 98–111)
Creatinine, Ser: 1.95 mg/dL — ABNORMAL HIGH (ref 0.61–1.24)
GFR calc Af Amer: 37 mL/min — ABNORMAL LOW (ref >60)
GFR calc non Af Amer: 32 mL/min — ABNORMAL LOW (ref >60)
Glucose, Bld: 126 mg/dL — ABNORMAL HIGH (ref 70–99)
Potassium: 3.9 mmol/L (ref 3.5–5.1)
Sodium: 140 mmol/L (ref 135–145)

## 2020-02-25 LAB — CBC
HCT: 37.3 % — ABNORMAL LOW (ref 39.0–52.0)
Hemoglobin: 11.6 g/dL — ABNORMAL LOW (ref 13.0–17.0)
MCH: 28.8 pg (ref 26.0–34.0)
MCHC: 31.1 g/dL (ref 30.0–36.0)
MCV: 92.6 fL (ref 80.0–100.0)
Platelets: 230 10*3/uL (ref 150–400)
RBC: 4.03 MIL/uL — ABNORMAL LOW (ref 4.22–5.81)
RDW: 13.9 % (ref 11.5–15.5)
WBC: 9.6 10*3/uL (ref 4.0–10.5)
nRBC: 0 % (ref 0.0–0.2)

## 2020-02-25 LAB — MAGNESIUM: Magnesium: 2.1 mg/dL (ref 1.7–2.4)

## 2020-02-25 MED ORDER — SODIUM CHLORIDE 0.9 % IV BOLUS
1000.0000 mL | Freq: Once | INTRAVENOUS | Status: AC
  Administered 2020-02-25: 1000 mL via INTRAVENOUS

## 2020-02-25 MED ORDER — SODIUM CHLORIDE 0.9% FLUSH: 3.0000 mL | Freq: Once | INTRAVENOUS | Status: DC

## 2020-02-25 NOTE — ED Triage Notes (Signed)
Pt reports approx 1 hour ago he had what appeared to be a syncopal episode while fishing (was standing up) granddaughter unsure how long patient was unconscious but reports he was incontinent during episode, he states he recalls the event and heard family talking to him. A/ox4, resp e/u, nad.

## 2020-02-25 NOTE — ED Notes (Signed)
Pt discharged home per MD order. Discharge summary reviewed, pt verbalizes understanding. Off unit via WC. No s/s of acute distress noted. Voicing no complaints, pt granddaughter discharge ride home.

## 2020-02-25 NOTE — ED Provider Notes (Signed)
[facility] EMERGENCY DEPARTMENT Provider Note   CSN: [id number] Arrival date & time: 02/25/20  1330     History Chief Complaint  Patient presents with  . Loss of Consciousness    Brandon Taylor is a 79 y.o. male.  80 yo M with a chief complaint of a syncopal event.  Patient states that he was fishing [place] a [place] of [address] and he suddenly felt like he may pass out.  He started walking to a truck and he ended up collapsing to the ground.  Was lifted up by his body was fishing with him and he collapsed again.  Now he feels great.  States he has had no chest pain no trouble breathing no headache or neck pain.  He thinks he has been eating and drinking a little bit less but this been an ongoing issue for him.  He denies dark stool or blood in his stool.  Had a little bit of a cough last night but otherwise denies coughing.  He thought he had something in the back of his throat.     The history is provided by the patient.  Illness Severity:  Moderate Onset quality:  Gradual Duration:  1 day Timing:  Rare Progression:  Resolved Chronicity:  New Associated symptoms: no abdominal pain, no chest pain, no congestion, no diarrhea, no fever, no headaches, no myalgias, no rash, no shortness of breath and no vomiting        Past Medical History:  Diagnosis Date  . ETOH abuse   . GERD (gastroesophageal reflux disease)     Patient Active Problem List   Diagnosis Date Noted  . Anemia of chronic disease 09/28/2019  . Renal insufficiency 09/28/2019  . Mixed hyperlipidemia 07/20/2017  . Mild dementia (HCC) 05/29/2017  . Surgical site reaction 12/06/2015  . Routine general medical examination at a health care facility 10/24/2014  . Vitamin B12 deficiency 10/03/2013  . Loss of weight 10/03/2013  . Alcohol abuse 10/03/2013  . Nicotine abuse 10/03/2013  . Hypoglycemia 09/22/2013  . General weakness 09/22/2013  . GERD (gastroesophageal reflux disease)   . ETOH abuse      Past Surgical History:  Procedure Laterality Date  . NO PAST SURGERIES         Family History  Problem Relation Age of Onset  . COPD Brother     Social History   Tobacco Use  . Smoking status: Former Smoker    Packs/day: 2.00    Years: 40.00    Pack years: 80.00    Quit date: 10/13/2012    Years since quitting: 7.3  . Smokeless tobacco: Never Used  Substance Use Topics  . Alcohol use: Not Currently    Comment: fifth daily  . Drug use: No    Home Medications Prior to Admission medications   Medication Sig Start Date End Date Taking? Authorizing Provider  amLODipine (NORVASC) 5 MG tablet Take 1 tablet (5 mg total) by mouth daily. Must have office visit for refills 09/23/19  Yes Marcine Matar, MD  ASPIRIN ADULT LOW STRENGTH 81 MG EC tablet TAKE 1 TABLET BY MOUTH EVERY DAY Patient taking differently: Take 81 mg by mouth daily.  04/19/19  Yes Marcine Matar, MD  cyanocobalamin ([facility] VITAMIN B12) 1000 MCG tablet TAKE 1 TABLET BY MOUTH EVERY Day Patient taking differently: Take 1,000 mcg by mouth daily.  05/21/18  Yes Marcine Matar, MD  pantoprazole (PROTONIX) 40 MG tablet Take 1 tablet (40  mg total) by mouth daily. 02/06/20  Yes Marcine Matar, MD  pravastatin (PRAVACHOL) 20 MG tablet Take 1 tablet (20 mg total) by mouth daily. Must have office visit for refills 09/23/19  Yes Marcine Matar, MD  feeding supplement, ENSURE ENLIVE, (ENSURE ENLIVE) LIQD Take 237 mLs by mouth 2 (two) times daily between meals. Patient not taking: Reported on 02/25/2020 02/19/17   Arnetha Courser, MD  Multiple Vitamin (MULTIVITAMIN WITH MINERALS) TABS tablet Take 1 tablet by mouth daily. Patient not taking: Reported on 02/25/2020 04/10/17   Lizbeth Bark, FNP  Tdap Leda Min) 5-2.5-18.5 LF-MCG/0.5 injection Inject 0.5 mLs into the muscle as directed. Patient not taking: Reported on 02/25/2020 09/20/18   Marcine Matar, MD    Allergies    Patient has no known  allergies.  Review of Systems   Review of Systems  Constitutional: Negative for chills and fever.  HENT: Negative for congestion and facial swelling.   Eyes: Negative for discharge and visual disturbance.  Respiratory: Negative for shortness of breath.   Cardiovascular: Negative for chest pain and palpitations.  Gastrointestinal: Negative for abdominal pain, diarrhea and vomiting.  Musculoskeletal: Negative for arthralgias and myalgias.  Skin: Negative for color change and rash.  Neurological: Positive for syncope. Negative for tremors and headaches.  Psychiatric/Behavioral: Negative for confusion and dysphoric mood.    Physical Exam Updated Vital Signs BP (!) 157/75 (BP Location: Right Arm)   Pulse 78   Temp 97.6 F (36.4 C) (Oral)   Resp 15   SpO2 100%   Physical Exam Vitals and nursing note reviewed.  Constitutional:      Appearance: He is well-developed.     Comments: Cachectic  HENT:     Head: Normocephalic and atraumatic.  Eyes:     Pupils: Pupils are equal, round, and reactive to light.  Neck:     Vascular: No JVD.  Cardiovascular:     Rate and Rhythm: Normal rate and regular rhythm.     Heart sounds: No murmur. No friction rub. No gallop.   Pulmonary:     Effort: No respiratory distress.     Breath sounds: No wheezing.  Abdominal:     General: There is no distension.     Tenderness: There is no abdominal tenderness. There is no guarding or rebound.  Musculoskeletal:        General: Normal range of motion.     Cervical back: Normal range of motion and neck supple.  Skin:    Coloration: Skin is not pale.     Findings: No rash.  Neurological:     Mental Status: He is alert and oriented to person, place, and time.  Psychiatric:        Behavior: Behavior normal.     ED Results / Procedures / Treatments   Labs (all labs ordered are listed, but only abnormal results are displayed) Labs Reviewed  BASIC METABOLIC PANEL - Abnormal; Notable for the following  components:      Result Value   Glucose, Bld 126 (*)    Creatinine, Ser 1.95 (*)    GFR calc non Af Amer 32 (*)    GFR calc Af Amer 37 (*)    All other components within normal limits  CBC - Abnormal; Notable for the following components:   RBC 4.03 (*)    Hemoglobin 11.6 (*)    HCT 37.3 (*)    All other components within normal limits  URINALYSIS, ROUTINE W REFLEX MICROSCOPIC - Abnormal;  Notable for the following components:   Color, Urine STRAW (*)    Hgb urine dipstick MODERATE (*)    Bacteria, UA RARE (*)    All other components within normal limits  MAGNESIUM  CBG MONITORING, ED    EKG EKG Interpretation  Date/Time:  Saturday Feb 25 2020 13:33:29 EDT Ventricular Rate:  70 PR Interval:  190 QRS Duration: 80 QT Interval:  426 QTC Calculation: 460 R Axis:   45 Text Interpretation: Normal sinus rhythm Septal infarct , age undetermined Abnormal ECG No significant change from May 2018 ecg No STEMI Confirmed by Octaviano Glow [phone]) on 02/25/2020 3:11:04 PM   Radiology No results found.  Procedures Procedures (including critical care time)  Medications Ordered in ED Medications  sodium chloride flush (NS) 0.9 % injection 3 mL (has no administration in time range)  sodium chloride 0.9 % bolus 1,000 mL (0 mLs Intravenous Stopped 02/25/20 1730)    ED Course  I have reviewed the triage vital signs and the nursing notes.  Pertinent labs & imaging results that were available during my care of the patient were reviewed by me and considered in my medical decision making (see chart for details).    MDM Rules/Calculators/A&P                      79 yo M with a chief complaint of a syncopal event.  Sounds vasovagal by history.  He is currently back to his baseline and denies complaints.  Lab work thus far with may be mild worsening renal dysfunction.  We will give a bolus of IV fluids here.  Oral trial.  Reassess.  Feeling better requesting d/c.   Marland Kitchen 10:17 PM:  I have  discussed the diagnosis/risks/treatment options with the patient and family and believe the pt to be eligible for discharge home to follow-up with PCP. We also discussed returning to the ED immediately if new or worsening sx occur. We discussed the sx which are most concerning (e.g., sudden worsening pain, fever, inability to tolerate by mouth) that necessitate immediate return. Medications administered to the patient during their visit and any new prescriptions provided to the patient are listed below.  Medications given during this visit Medications  sodium chloride flush (NS) 0.9 % injection 3 mL (has no administration in time range)  sodium chloride 0.9 % bolus 1,000 mL (0 mLs Intravenous Stopped 02/25/20 1730)     The patient appears reasonably screen and/or stabilized for discharge and I doubt any other medical condition or other [facility] requiring further screening, evaluation, or treatment in the ED at this time prior to discharge.   Final Clinical Impression(s) / ED Diagnoses Final diagnoses:  Syncope and collapse    Rx / DC Orders ED Discharge Orders    None       Deno Etienne, DO 02/25/20 2217

## 2020-02-25 NOTE — Discharge Instructions (Signed)
Please return for chest pain shortness of breath headache neck pain or recurrent event where you pass out.  Please eat and drink as well as you can for the next 48 hours.  Discuss your visit here with your family doctor they may want to do further work-up or refer you to a specialist.

## 2020-02-27 ENCOUNTER — Telehealth: Payer: Self-pay | Admitting: *Deleted

## 2020-02-27 NOTE — Telephone Encounter (Signed)
over due for f/u visit. also needs ER f/u visit. Please schedule   Called patient to schedule him an appt and he stated he wants to wait on scheduling due to wife being in the hospital.

## 2020-03-09 ENCOUNTER — Other Ambulatory Visit: Payer: Self-pay | Admitting: Internal Medicine

## 2020-03-09 DIAGNOSIS — K219 Gastro-esophageal reflux disease without esophagitis: Secondary | ICD-10-CM

## 2020-03-17 ENCOUNTER — Other Ambulatory Visit: Payer: Self-pay | Admitting: Internal Medicine

## 2020-03-17 DIAGNOSIS — E785 Hyperlipidemia, unspecified: Secondary | ICD-10-CM

## 2020-05-11 ENCOUNTER — Other Ambulatory Visit: Payer: Self-pay | Admitting: Internal Medicine

## 2020-05-11 DIAGNOSIS — E785 Hyperlipidemia, unspecified: Secondary | ICD-10-CM

## 2020-05-16 ENCOUNTER — Other Ambulatory Visit: Payer: Self-pay | Admitting: Internal Medicine

## 2020-05-16 DIAGNOSIS — E785 Hyperlipidemia, unspecified: Secondary | ICD-10-CM

## 2020-05-16 NOTE — Telephone Encounter (Signed)
Requested medication (s) are due for refill today: yes  Requested medication (s) are on the active medication list: yes   Future visit scheduled: no  Notes to clinic:  called patient to schedule follow up Wife states that patient was not home and would have him contact office   Requested Prescriptions  Pending Prescriptions Disp Refills   pravastatin (PRAVACHOL) 20 MG tablet 90 tablet 0    Sig: Take 1 tablet (20 mg total) by mouth daily. Must have office visit for refills      Cardiovascular:  Antilipid - Statins Failed - 05/16/2020 12:47 PM      Failed - LDL in normal range and within 360 days    LDL Chol Calc (NIH)  Date Value Ref Range Status  09/26/2019 98 0 - 99 mg/dL Final          Passed - Total Cholesterol in normal range and within 360 days    Cholesterol, Total  Date Value Ref Range Status  09/26/2019 187 100 - 199 mg/dL Final          Passed - HDL in normal range and within 360 days    HDL  Date Value Ref Range Status  09/26/2019 77 >39 mg/dL Final          Passed - Triglycerides in normal range and within 360 days    Triglycerides  Date Value Ref Range Status  09/26/2019 61 0 - 149 mg/dL Final          Passed - Patient is not pregnant      Passed - Valid encounter within last 12 months    Recent Outpatient Visits           7 months ago Essential hypertension   [facility] Marcine Matar, MD   1 year ago Essential hypertension   [facility] Marcine Matar, MD   1 year ago Essential hypertension   [facility] Marcine Matar, MD   1 year ago Anemia, unspecified type   [facility] Marcine Matar, MD   2 years ago Essential hypertension   [facility] [place], [place], [place]

## 2020-05-16 NOTE — Telephone Encounter (Signed)
Medication Refill - Medication: pravastatin (PRAVACHOL) 20 MG tablet (Patient is requesting 90 day supply   Has the patient contacted their pharmacy? yes (Agent: If no, request that the patient contact the pharmacy for the refill.) (Agent: If yes, when and what did the pharmacy advise?)Contact PCP  Preferred Pharmacy (with phone number or street name):  [id number] [place], [place] - [address] Phone:  [phone]  Fax:  [phone]       Agent: Please be advised that RX refills may take up to 3 business days. We ask that you follow-up with your pharmacy.

## 2020-05-31 ENCOUNTER — Other Ambulatory Visit: Payer: Self-pay | Admitting: Internal Medicine

## 2020-05-31 DIAGNOSIS — E785 Hyperlipidemia, unspecified: Secondary | ICD-10-CM

## 2020-05-31 NOTE — Telephone Encounter (Signed)
Requested Prescriptions  Pending Prescriptions Disp Refills  . pravastatin (PRAVACHOL) 20 MG tablet [Pharmacy Med Name: PRAVASTATIN SODIUM 20 MG TAB] 90 tablet 1    Sig: TAKE 1 TABLET BY MOUTH EVERY DAY NEEDS APPT FOR REFILLS     Cardiovascular:  Antilipid - Statins Failed - 05/31/2020  1:43 AM      Failed - LDL in normal range and within 360 days    LDL Chol Calc (NIH)  Date Value Ref Range Status  09/26/2019 98 0 - 99 mg/dL Final         Passed - Total Cholesterol in normal range and within 360 days    Cholesterol, Total  Date Value Ref Range Status  09/26/2019 187 100 - 199 mg/dL Final         Passed - HDL in normal range and within 360 days    HDL  Date Value Ref Range Status  09/26/2019 77 >39 mg/dL Final         Passed - Triglycerides in normal range and within 360 days    Triglycerides  Date Value Ref Range Status  09/26/2019 61 0 - 149 mg/dL Final         Passed - Patient is not pregnant      Passed - Valid encounter within last 12 months    Recent Outpatient Visits          8 months ago Essential hypertension   [facility] Marcine Matar, MD   1 year ago Essential hypertension   [facility] Marcine Matar, MD   1 year ago Essential hypertension    [facility]   2 years ago Anemia, unspecified type   [facility] Marcine Matar, MD   2 years ago Essential hypertension   [facility] [place], [place], [place]

## 2020-09-14 ENCOUNTER — Other Ambulatory Visit: Payer: Self-pay | Admitting: Internal Medicine

## 2020-09-14 DIAGNOSIS — I10 Essential (primary) hypertension: Secondary | ICD-10-CM

## 2020-09-14 NOTE — Telephone Encounter (Signed)
Requested medications are due for refill today yes  Requested medications are on the active medication list yes  Last refill 9/3  Last visit 09/2019  Future visit scheduled no  Notes to clinic Failed protocol due to no valid visit within 6  months.

## 2020-10-11 ENCOUNTER — Other Ambulatory Visit: Payer: Self-pay | Admitting: Internal Medicine

## 2020-10-11 DIAGNOSIS — I10 Essential (primary) hypertension: Secondary | ICD-10-CM

## 2020-10-11 NOTE — Telephone Encounter (Signed)
Phone call to pt's. Wife.  Appt. Scheduled for next available Monday with Dr. Laural Benes on 12/03/20; wife specifically requested a Monday, due to having to rely on other means of transportation. Courtesy refill #60 given until appt. 12/03/20.  Requested Prescriptions  Pending Prescriptions Disp Refills  . amLODipine (NORVASC) 5 MG tablet [Pharmacy Med Name: AMLODIPINE BESYLATE 5 MG TAB] 60 tablet 0    Sig: TAKE 1 TABLET (5 MG TOTAL) BY MOUTH DAILY. MUST HAVE OFFICE VISIT FOR REFILLS     Cardiovascular:  Calcium Channel Blockers Failed - 10/11/2020  8:30 AM      Failed - Last BP in normal range    BP Readings from Last 1 Encounters:  02/25/20 (!) 157/75         Failed - Valid encounter within last 6 months    Recent Outpatient Visits          1 year ago Essential hypertension   [facility] Marcine Matar, MD   1 year ago Essential hypertension   [facility] Marcine Matar, MD   2 years ago Essential hypertension   [facility] Marcine Matar, MD   2 years ago Anemia, unspecified type   [facility] Marcine Matar, MD   3 years ago Essential hypertension   [facility] [place], [place], FNP      Future Appointments            In 1 month Laural Benes Binnie Rail, MD [facility]

## 2020-11-21 ENCOUNTER — Other Ambulatory Visit: Payer: Self-pay | Admitting: Internal Medicine

## 2020-11-21 DIAGNOSIS — E785 Hyperlipidemia, unspecified: Secondary | ICD-10-CM

## 2020-12-03 ENCOUNTER — Encounter: Payer: Self-pay | Admitting: Internal Medicine

## 2020-12-03 ENCOUNTER — Ambulatory Visit: Payer: Medicare Other | Attending: Internal Medicine | Admitting: Internal Medicine

## 2020-12-03 ENCOUNTER — Other Ambulatory Visit: Payer: Self-pay

## 2020-12-03 VITALS — BP 155/78 | HR 79 | Resp 16 | Ht 70.5 in | Wt 133.4 lb

## 2020-12-03 DIAGNOSIS — I10 Essential (primary) hypertension: Secondary | ICD-10-CM

## 2020-12-03 DIAGNOSIS — E538 Deficiency of other specified B group vitamins: Secondary | ICD-10-CM | POA: Diagnosis not present

## 2020-12-03 DIAGNOSIS — F0280 Dementia in other diseases classified elsewhere without behavioral disturbance: Secondary | ICD-10-CM

## 2020-12-03 DIAGNOSIS — K219 Gastro-esophageal reflux disease without esophagitis: Secondary | ICD-10-CM | POA: Diagnosis not present

## 2020-12-03 DIAGNOSIS — Z289 Immunization not carried out for unspecified reason: Secondary | ICD-10-CM

## 2020-12-03 DIAGNOSIS — Z23 Encounter for immunization: Secondary | ICD-10-CM | POA: Insufficient documentation

## 2020-12-03 DIAGNOSIS — G309 Alzheimer's disease, unspecified: Secondary | ICD-10-CM

## 2020-12-03 DIAGNOSIS — E785 Hyperlipidemia, unspecified: Secondary | ICD-10-CM | POA: Diagnosis not present

## 2020-12-03 MED ORDER — PRAVASTATIN SODIUM 20 MG PO TABS: 20.0000 mg | ORAL_TABLET | Freq: Every day | ORAL | 1 refills | Status: DC

## 2020-12-03 MED ORDER — AMLODIPINE BESYLATE 10 MG PO TABS: 10.0000 mg | ORAL_TABLET | Freq: Every day | ORAL | 1 refills | Status: DC

## 2020-12-03 MED ORDER — PANTOPRAZOLE SODIUM 40 MG PO TBEC: 40.0000 mg | DELAYED_RELEASE_TABLET | Freq: Every day | ORAL | 1 refills | Status: DC

## 2020-12-03 MED ORDER — CYANOCOBALAMIN 1000 MCG PO TABS: 1000.0000 ug | ORAL_TABLET | Freq: Every day | ORAL | 1 refills | Status: DC

## 2020-12-03 NOTE — Patient Instructions (Addendum)
Recommend trying to get in 3 meals a day instead of 2.  You can supplement your meals with Ensure or boost shakes.  Remember to get your COVID-19 booster vaccine.  You can get this at your local pharmacy or the health department.  Influenza Virus Vaccine injection (Fluarix) What is this medicine? INFLUENZA VIRUS VACCINE (in floo EN zuh VAHY ruhs vak SEEN) helps to reduce the risk of getting influenza also known as the flu. This medicine may be used for other purposes; ask your health care provider or pharmacist if you have questions. COMMON BRAND NAME(S): Fluarix, Fluzone What should I tell my health care provider before I take this medicine? They need to know if you have any of these conditions:  bleeding disorder like hemophilia  fever or infection  Guillain-Barre syndrome or other neurological problems  immune system problems  infection with the human immunodeficiency virus (HIV) or AIDS  low blood platelet counts  multiple sclerosis  an unusual or allergic reaction to influenza virus vaccine, eggs, chicken proteins, latex, gentamicin, other medicines, foods, dyes or preservatives  pregnant or trying to get pregnant  breast-feeding How should I use this medicine? This vaccine is for injection into a muscle. It is given by a health care professional. A copy of Vaccine Information Statements will be given before each vaccination. Read this sheet carefully each time. The sheet may change frequently. Talk to your pediatrician regarding the use of this medicine in children. Special care may be needed. Overdosage: If you think you have taken too much of this medicine contact a poison control center or emergency room at once. NOTE: This medicine is only for you. Do not share this medicine with others. What if I miss a dose? This does not apply. What may interact with this medicine?  chemotherapy or radiation therapy  medicines that lower your immune system like etanercept,  anakinra, infliximab, and adalimumab  medicines that treat or prevent blood clots like warfarin  phenytoin  steroid medicines like prednisone or cortisone  theophylline  vaccines This list may not describe all possible interactions. Give your health care provider a list of all the medicines, herbs, non-prescription drugs, or dietary supplements you use. Also tell them if you smoke, drink alcohol, or use illegal drugs. Some items may interact with your medicine. What should I watch for while using this medicine? Report any side effects that do not go away within 3 days to your doctor or health care professional. Call your health care provider if any unusual symptoms occur within 6 weeks of receiving this vaccine. You may still catch the flu, but the illness is not usually as bad. You cannot get the flu from the vaccine. The vaccine will not protect against colds or other illnesses that may cause fever. The vaccine is needed every year. What side effects may I notice from receiving this medicine? Side effects that you should report to your doctor or health care professional as soon as possible:  allergic reactions like skin rash, itching or hives, swelling of the face, lips, or tongue Side effects that usually do not require medical attention (report to your doctor or health care professional if they continue or are bothersome):  fever  headache  muscle aches and pains  pain, tenderness, redness, or swelling at site where injected  weak or tired This list may not describe all possible side effects. Call your doctor for medical advice about side effects. You may report side effects to FDA at 1-800-FDA-1088. Where  should I keep my medicine? This vaccine is only given in a clinic, pharmacy, doctor's office, or other health care setting and will not be stored at home. NOTE: This sheet is a summary. It may not cover all possible information. If you have questions about this medicine, talk  to your doctor, pharmacist, or health care provider.  2021 Elsevier/Gold Standard (2008-04-26 09:30:40)

## 2020-12-03 NOTE — Progress Notes (Signed)
Patient ID: Brandon Taylor, male    DOB: 1941-07-30  MRN: [id number]  CC: Hypertension and Medication Refill   Subjective: Brandon Taylor is a 80 y.o. male who presents for chronic ds management.  Wife is with him. His concerns today include:  Pt with hx of GERD, ETOH use disorder, vit B12 def, HL, HTN, mild dementia.   Patient's last evaluation with me was 09/2019.  Dementia: Patient has been living alone for the past year.  Wife has been living with her grand-daughter because the house that she and her husband lives in has only wood-burning heat and it has been adversely affecting her breathing.  She checks in on him and does grocery shopping.  She has no concern of him living by himself.  She does not feel that he has had any significant decline in his memory.  Patient also feels that he has not had a significant decline in his memory. Patient reports independence in cooking for himself, bathing, toileting.  States that he cooks 2 meals a day.  He denies any problems with getting lost or forgetting to turn the stove off.  He does not drive. -Reports good appetite.  States that he is sleeping well. -He manages his own medications.  Wife picks them up for him.  He is on amlodipine and pravastatin.  He reports compliance with medications and took them already for today.  He denies any headaches or dizziness.  No chest pains or shortness of breath.  Vit B12 def:  Wife was buying OTC but she is not sure that he has any more.  Patient denies any numbness or tingling in the extremities.    He has been out of pantoprazole.  His wife has been sharing hers with him until he got in today to have refills.  Patient Active Problem List   Diagnosis Date Noted  . Influenza vaccine needed 12/03/2020  . Anemia of chronic disease 09/28/2019  . Renal insufficiency 09/28/2019  . Mixed hyperlipidemia 07/20/2017  . Mild dementia (HCC) 05/29/2017  . Surgical site reaction 12/06/2015  . Routine general medical  examination at a health care facility 10/24/2014  . Vitamin B12 deficiency 10/03/2013  . Loss of weight 10/03/2013  . Alcohol abuse 10/03/2013  . Nicotine abuse 10/03/2013  . Hypoglycemia 09/22/2013  . General weakness 09/22/2013  . GERD (gastroesophageal reflux disease)   . ETOH abuse      Current Outpatient Medications on File Prior to Visit  Medication Sig Dispense Refill  . ASPIRIN ADULT LOW STRENGTH 81 MG EC tablet TAKE 1 TABLET BY MOUTH EVERY DAY (Patient taking differently: Take 81 mg by mouth daily. ) 90 tablet 0  . feeding supplement, ENSURE ENLIVE, (ENSURE ENLIVE) LIQD Take 237 mLs by mouth 2 (two) times daily between meals. (Patient not taking: Reported on 02/25/2020) 237 mL 12  . Multiple Vitamin (MULTIVITAMIN WITH MINERALS) TABS tablet Take 1 tablet by mouth daily. (Patient not taking: Reported on 02/25/2020) 60 tablet 0  . Tdap (BOOSTRIX) 5-2.5-18.5 LF-MCG/0.5 injection Inject 0.5 mLs into the muscle as directed. (Patient not taking: Reported on 02/25/2020) 0.5 mL 0   No current facility-administered medications on file prior to visit.    No Known Allergies  Social History   Socioeconomic History  . Marital status: Married    Spouse name: Not on file  . Number of children: Not on file  . Years of education: Not on file  . Highest education level: Not on file  Occupational  History  . Not on file  Tobacco Use  . Smoking status: Former Smoker    Packs/day: 2.00    Years: 40.00    Pack years: 80.00    Quit date: 10/13/2012    Years since quitting: 8.1  . Smokeless tobacco: Never Used  Substance and Sexual Activity  . Alcohol use: Not Currently    Comment: fifth daily  . Drug use: No  . Sexual activity: Not on file  Other Topics Concern  . 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 COPD Brother     Past Surgical History:  Procedure Laterality Date  . NO PAST SURGERIES      ROS: Review of Systems  Respiratory: Negative for cough and shortness of breath.   Cardiovascular: Negative for chest pain.  Gastrointestinal: Negative for blood in stool.  Genitourinary: Negative for difficulty urinating and hematuria.  Neurological: Negative for dizziness.    PHYSICAL EXAM: BP (!) 155/78   Pulse 79   Resp 16   Ht 5' 10.5" (1.791 m)   Wt 133 lb 6.4 oz (60.5 kg)   SpO2 98%   BMI 18.87 kg/m   Wt Readings from Last 3 Encounters:  12/03/20 133 lb 6.4 oz (60.5 kg)  09/20/18 134 lb 3.2 oz (60.9 kg)  05/24/18 133 lb (60.3 kg)  BP 160/80  Physical Exam  General appearance -patient is alert pleasant African-American male.  He appears slightly underweight for height.  He is wearing a few layers of clothing.   Mental status - normal mood, behavior, speech, dress, motor activity, and thought processes.  He is unable to tell me the day of the week, the month or the year. Eyes -positive arcus analysis.   Mouth - mucous membranes moist, pharynx normal without lesions Neck - supple, no significant adenopathy Chest - clear to auscultation, no wheezes, rales or rhonchi, symmetric air entry Heart - normal rate, regular rhythm, normal S1, S2, no murmurs, rubs, clicks or gallops Abdomen - soft, nontender, nondistended, no masses or organomegaly Musculoskeletal -patient ambulates independently  extremities -no lower extremity edema. Depression screen [facility] 2/9 12/03/2020 01/25/2019 09/20/2018  Decreased Interest 0 0 0  Down, Depressed, Hopeless 0 0 0  PHQ - 2 Score 0 0 0  Altered sleeping  - 0 -  Tired, decreased energy - 0 -  Change in appetite - 0 -  Feeling bad or failure about yourself  - 0 -  Trouble concentrating - 0 -  Moving slowly or fidgety/restless - 0 -  Suicidal thoughts - 0 -  PHQ-9 Score - 0 -    CMP Latest Ref Rng & Units 02/25/2020 09/26/2019 05/21/2018  Glucose 70 - 99 mg/dL 147(W) 73 80  BUN 8 - 23 mg/dL 16 20 17   Creatinine 0.61 - 1.24 mg/dL ) 2.95(A) 2.13(Y  Sodium 135 - 145 mmol/L 140 144 146(H)  Potassium 3.5 - 5.1 mmol/L 3.9 4.2 4.5  Chloride 98 - 111 mmol/L 103 104 104  CO2 22 - 32 mmol/L 27 27 26   Calcium 8.9 - 10.3 mg/dL 9.0 9.8 9.4  Total Protein 6.0 - 8.5 g/dL - 6.7 6.5  Total Bilirubin 0.0 - 1.2 mg/dL - 0.3 0.2  Alkaline Phos 39 - 117 IU/L - 83 73  AST 0 - 40 IU/L - 17 17  ALT 0 - 44 IU/L - 8 10   Lipid Panel     Component Value Date/Time   CHOL 187 09/26/2019 1121   TRIG 61 09/26/2019 1121   HDL 77 09/26/2019 1121   CHOLHDL 2.4 09/26/2019 1121   CHOLHDL 2.5 01/02/2014 0947   VLDL 14 01/02/2014 0947   LDLCALC 98 09/26/2019 1121    CBC    Component Value Date/Time   WBC 9.6 02/25/2020 1345   RBC 4.03 (L) 02/25/2020 1345   HGB 11.6 (L) 02/25/2020 1345   HGB 11.9 (L) 09/26/2019 1121   HCT 37.3 (L) 02/25/2020 1345   HCT 34.3 (L) 09/26/2019 1121   PLT 230 02/25/2020 1345   PLT 232 09/26/2019 1121   MCV 92.6 02/25/2020 1345   MCV 87 09/26/2019 1121   MCH 28.8 02/25/2020 1345   MCHC 31.1 02/25/2020 1345   RDW 13.9 02/25/2020 1345   RDW 12.3 09/26/2019 1121   LYMPHSABS 1.7 04/10/2017 1708   MONOABS 0.3 10/24/2014 1114   EOSABS 0.1 04/10/2017 1708   BASOSABS 0.0 04/10/2017 1708   MMSE - Mini Mental State Exam 12/03/2020 05/24/2018 05/29/2017  Not completed: - - -  Orientation to time 1 0 2  Orientation to Place 5 3 4   Registration 3 3 3   Attention/ Calculation 0 0 0  Recall 3 0 2  Language- name 2 objects 2 2 2   Language- repeat 1 1 1   Language- follow 3 step command 3 2 3   Language- read & follow direction 1 0 0   Write a sentence 1 0 0  Copy design 0 0 0  Total score 20 11 17     ASSESSMENT AND PLAN:  1. Essential hypertension Not at goal.  Increase amlodipine to 10 mg daily.  Recommend that he get a med box and fill it once a week. - CBC - Comprehensive metabolic panel - amLODipine (NORVASC) 10 MG tablet; Take 1 tablet (10 mg total) by mouth daily.  Dispense: 90 tablet; Refill: 1  2. Dementia due to Alzheimer's disease (HCC) His Mini-Mental score today is actually improved from 3 years ago.  He does however has some mild cognitive deficit even though he feels that his memory is good.  However he seems to be still able to function well.  Encouraged family to check on him regularly.  3. Vitamin B12 deficiency - Vitamin B12 - cyanocobalamin ([facility] VITAMIN B12) 1000 MCG tablet; Take 1 tablet (1,000 mcg total) by mouth daily.  Dispense: 100 tablet; Refill: 1  4. Hyperlipidemia, unspecified hyperlipidemia type - pravastatin (PRAVACHOL) 20 MG tablet; Take 1 tablet (20 mg total) by mouth daily.  Dispense: 90 tablet; Refill: 1 - Lipid panel  5. Gastroesophageal reflux disease - pantoprazole (PROTONIX) 40 MG tablet; Take 1 tablet (40 mg total) by mouth daily.  Dispense: 90 tablet; Refill: 1  6. Influenza vaccine needed Given  7. COVID-19 vaccine series not completed Needs a booster shot.  Wife plans to take him to local pharmacy to get his booster shot.   Patient was given the opportunity to ask questions.  Patient verbalized understanding of the  plan and was able to repeat key elements of the plan.   Orders Placed This Encounter  Procedures  . CBC  . Comprehensive metabolic panel  . Vitamin B12  . Lipid panel     Requested Prescriptions   Signed Prescriptions Disp Refills  . amLODipine (NORVASC) 10 MG tablet 90 tablet 1    Sig: Take 1 tablet (10 mg total) by mouth daily.  . pravastatin (PRAVACHOL) 20 MG tablet 90 tablet 1    Sig: Take 1 tablet (20 mg total) by mouth daily.  .  pantoprazole (PROTONIX) 40 MG tablet 90 tablet 1    Sig: Take 1 tablet (40 mg total) by mouth daily.  . cyanocobalamin ([facility] VITAMIN B12) 1000 MCG tablet 100 tablet 1    Sig: Take 1 tablet (1,000 mcg total) by mouth daily.    Return in about 5 months (around 05/02/2021) for Give appt with Franky MachoLuke in 4 wks for [facility].  Jonah Blueeborah Talha Iser, MD, FACP

## 2020-12-04 ENCOUNTER — Encounter: Payer: Self-pay | Admitting: Internal Medicine

## 2020-12-04 DIAGNOSIS — N183 Chronic kidney disease, stage 3 unspecified: Secondary | ICD-10-CM | POA: Insufficient documentation

## 2020-12-04 DIAGNOSIS — N1832 Chronic kidney disease, stage 3b: Secondary | ICD-10-CM | POA: Insufficient documentation

## 2020-12-04 LAB — COMPREHENSIVE METABOLIC PANEL
ALT: 8 IU/L (ref 0–44)
AST: 19 IU/L (ref 0–40)
Albumin/Globulin Ratio: 1.5 (ref 1.2–2.2)
Albumin: 4.4 g/dL (ref 3.6–4.6)
Alkaline Phosphatase: 67 IU/L (ref 44–121)
BUN/Creatinine Ratio: 11 (ref 10–24)
BUN: 18 mg/dL (ref 8–27)
Bilirubin Total: 0.3 mg/dL (ref 0.0–1.2)
CO2: 23 mmol/L (ref 20–29)
Calcium: 8.8 mg/dL (ref 8.6–10.2)
Chloride: 105 mmol/L (ref 96–106)
Creatinine, Ser: 1.61 mg/dL — ABNORMAL HIGH (ref 0.76–1.27)
GFR calc Af Amer: 46 mL/min/{1.73_m2} — ABNORMAL LOW (ref >59)
GFR calc non Af Amer: 40 mL/min/{1.73_m2} — ABNORMAL LOW (ref >59)
Globulin, Total: 2.9 g/dL (ref 1.5–4.5)
Glucose: 88 mg/dL (ref 65–99)
Potassium: 4.2 mmol/L (ref 3.5–5.2)
Sodium: 144 mmol/L (ref 134–144)
Total Protein: 7.3 g/dL (ref 6.0–8.5)

## 2020-12-04 LAB — LIPID PANEL
Chol/HDL Ratio: 2.4 ratio (ref 0.0–5.0)
Cholesterol, Total: 201 mg/dL — ABNORMAL HIGH (ref 100–199)
HDL: 83 mg/dL (ref >39)
LDL Chol Calc (NIH): 105 mg/dL — ABNORMAL HIGH (ref 0–99)
Triglycerides: 72 mg/dL (ref 0–149)
VLDL Cholesterol Cal: 13 mg/dL (ref 5–40)

## 2020-12-04 LAB — CBC
Hematocrit: 36.1 % — ABNORMAL LOW (ref 37.5–51.0)
Hemoglobin: 12.6 g/dL — ABNORMAL LOW (ref 13.0–17.7)
MCH: 30.8 pg (ref 26.6–33.0)
MCHC: 34.9 g/dL (ref 31.5–35.7)
MCV: 88 fL (ref 79–97)
Platelets: 234 10*3/uL (ref 150–450)
RBC: 4.09 x10E6/uL — ABNORMAL LOW (ref 4.14–5.80)
RDW: 13.1 % (ref 11.6–15.4)
WBC: 6.7 10*3/uL (ref 3.4–10.8)

## 2020-12-04 LAB — VITAMIN B12: Vitamin B-12: 432 pg/mL (ref 232–1245)

## 2020-12-04 NOTE — Progress Notes (Signed)
Let patient and his wife know that he has some mild but stable anemia.. His kidney function is not 100% but improved compared to 9 months ago.  He should avoid taking any over-the-counter pain medications like ibuprofen Aleve, Advil, Motrin and Naprosyn as these can make kidney function worse.  Liver function tests normal.  Vitamin B12 level normal.  However I recommend that he continue to take vitamin B12 1000 mcg daily which can be purchased over-the-counter.  LDL cholesterol is 105 with goal being less than 100.  Continue pravastatin.

## 2020-12-06 ENCOUNTER — Telehealth: Payer: Self-pay

## 2020-12-06 NOTE — Telephone Encounter (Signed)
Contacted pt to go over lab results spoke with pt wife and provided lab results pt wife doesn't have any questions or concerns

## 2020-12-07 ENCOUNTER — Other Ambulatory Visit: Payer: Self-pay | Admitting: Internal Medicine

## 2020-12-07 DIAGNOSIS — I10 Essential (primary) hypertension: Secondary | ICD-10-CM

## 2020-12-31 ENCOUNTER — Ambulatory Visit: Payer: Medicare Other | Attending: Internal Medicine | Admitting: Pharmacist

## 2020-12-31 ENCOUNTER — Other Ambulatory Visit: Payer: Self-pay

## 2020-12-31 ENCOUNTER — Encounter: Payer: Self-pay | Admitting: Pharmacist

## 2020-12-31 DIAGNOSIS — Z0000 Encounter for general adult medical examination without abnormal findings: Secondary | ICD-10-CM

## 2020-12-31 DIAGNOSIS — Z23 Encounter for immunization: Secondary | ICD-10-CM | POA: Diagnosis not present

## 2020-12-31 NOTE — Progress Notes (Signed)
Subjective:   Brandon Taylor is a 80 y.o. male who presents for Medicare Annual/Subsequent preventive examination.  Objective:    Today's Vitals   12/31/20 1138 12/31/20 1141  BP: (!) 152/80   Pulse: 86   Temp: 98.3 F (36.8 C)   SpO2: 98%   Weight: 135 lb 12.8 oz (61.6 kg)   Height: 5\' 10"  (1.778 m)   PainSc: 0-No pain 0-No pain   Body mass index is 19.49 kg/m.  Advanced Directives 12/31/2020 05/06/2017 02/17/2017 02/17/2017 12/06/2015 10/24/2014 09/22/2013  Does Patient Have a Medical Advance Directive? No No No No No No Patient does not have advance directive;Patient would not like information  Would patient like information on creating a medical advance directive? No - Patient declined - No - Patient declined No - Patient declined No - patient declined information No - patient declined information -  Pre-existing out of facility DNR order (yellow form or pink MOST form) - - - - - - No    Current Medications (verified) Outpatient Encounter Medications as of 12/31/2020  Medication Sig  . amLODipine (NORVASC) 10 MG tablet Take 1 tablet (10 mg total) by mouth daily.  . ASPIRIN ADULT LOW STRENGTH 81 MG EC tablet TAKE 1 TABLET BY MOUTH EVERY DAY (Patient taking differently: Take 81 mg by mouth daily.)  . cyanocobalamin ([facility] VITAMIN B12) 1000 MCG tablet Take 1 tablet (1,000 mcg total) by mouth daily.  . pantoprazole (PROTONIX) 40 MG tablet Take 1 tablet (40 mg total) by mouth daily.  . pravastatin (PRAVACHOL) 20 MG tablet Take 1 tablet (20 mg total) by mouth daily.  . [DISCONTINUED] feeding supplement, ENSURE ENLIVE, (ENSURE ENLIVE) LIQD Take 237 mLs by mouth 2 (two) times daily between meals. (Patient not taking: Reported on 02/25/2020)  . [DISCONTINUED] Multiple Vitamin (MULTIVITAMIN WITH MINERALS) TABS tablet Take 1 tablet by mouth daily. (Patient not taking: Reported on 02/25/2020)  . [DISCONTINUED] Tdap (BOOSTRIX) 5-2.5-18.5 LF-MCG/0.5 injection Inject 0.5 mLs into the muscle as  directed. (Patient not taking: Reported on 02/25/2020)   No facility-administered encounter medications on file as of 12/31/2020.    Allergies (verified) Patient has no known allergies.   History: Past Medical History:  Diagnosis Date  . ETOH abuse   . GERD (gastroesophageal reflux disease)    Past Surgical History:  Procedure Laterality Date  . NO PAST SURGERIES     Family History  Problem Relation Age of Onset  . COPD Brother    Social History   Socioeconomic History  . Marital status: Married    Spouse name: Not on file  . Number of children: Not on file  . Years of education: Not on file  . Highest education level: Not on file  Occupational History  . Not on file  Tobacco Use  . Smoking status: Former Smoker    Packs/day: 2.00    Years: 40.00    Pack years: 80.00    Quit date: 10/13/2012    Years since quitting: 8.2  . Smokeless tobacco: Never Used  Substance and Sexual Activity  . Alcohol use: Not Currently    Comment: fifth daily  . Drug use: No  . Sexual activity: Not on file  Other Topics Concern  . 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 Difficulty of Paying Living Expenses: Not very hard  Food Insecurity: No Food Insecurity  . Worried About Programme researcher, broadcasting/film/videounning Out of Food in the Last Year: Never true  . Ran Out of Food in the Last Year: Never true  Transportation Needs: No Transportation Needs  . Lack of Transportation (Medical): No  . Lack of Transportation (Non-Medical): No  Physical Activity: Sufficiently Active  . Days of Exercise per Week: 7 days  . Minutes of Exercise per Session: 30 min  Stress: No Stress Concern Present  . Feeling of Stress : Not at all  Social Connections: Socially Integrated  . Frequency of  Communication with Friends and Family: Three times a week  . Frequency of Social Gatherings with Friends and Family: Three times a week  . Attends Religious Services: More than 4 times per year  . Active Member of Clubs or Organizations: Yes  . Attends BankerClub or Organization Meetings: More than 4 times per year  . Marital Status: Married    Tobacco Counseling Counseling given: Not Answered   Clinical Intake:  Pre-visit preparation completed: No  Pain : No/denies pain Pain Score: 0-No pain     Nutritional Risks: None Diabetes: No  How often do you need to have someone help you when you read instructions, pamphlets, or other written materials from your doctor or pharmacy?: 4 - Often  Diabetic? No  Interpreter Needed?: No      Activities of Daily Living In your present state of health, do you have any difficulty performing the following activities: 12/31/2020  Hearing? N  Vision? N  Difficulty concentrating or making decisions? N  Walking or climbing stairs? N  Dressing or bathing? N  Doing errands, shopping? N  Preparing Food and eating ? N  Using the Toilet? N  In the past six months, have you accidently leaked urine? N  Do you have problems with loss of bowel control? N  Managing your Medications? N  Managing your Finances? N  Housekeeping or managing your Housekeeping? N  Some recent data might be hidden    Patient Care Team: Marcine MatarJohnson, [name], MD as PCP - General (Internal Medicine)  Indicate any recent Medical Services you may have received from other than [facility] providers in the past year (date may be approximate).     Assessment:   This is a routine wellness examination for Brandon Taylor.  Hearing/Vision screen No exam data present  Dietary issues and exercise activities discussed: Current Exercise Habits: The patient has a physically strenuous job, but has no regular exercise apart from work. Pincus Badder(Yard work)  Goals   None    Depression Screen PHQ 2/9  Scores 12/31/2020 12/31/2020 12/03/2020 01/25/2019 09/20/2018 05/21/2018 07/13/2017  PHQ - 2 Score 0 0 0 0 0 0 0  PHQ- 9 Score - - - 0 - - 0    Fall Risk Fall Risk  12/31/2020 12/03/2020 01/25/2019 05/24/2018 05/21/2018  Falls in the past year? 0 0 0 No No  Number falls in past yr: 0 0 - - -  Injury with Fall? 0 0 - - -  Follow up Falls evaluation completed;Education provided;Falls prevention discussed - - - -    FALL RISK PREVENTION PERTAINING TO THE HOME:  Any stairs in or around the home? Yes - front porch steps If so, are there any without handrails? No  Home free of loose throw rugs in walkways, pet beds, electrical cords,  etc? Yes  Adequate lighting in your home to reduce risk of falls? Yes   ASSISTIVE DEVICES UTILIZED TO PREVENT FALLS:  Life alert? No  Use of a cane, walker or w/c? No  Grab bars in the bathroom? No  Shower chair or bench in shower? No  Elevated toilet seat or a handicapped toilet? No   TIMED UP AND GO:  Was the test performed? Yes .  Length of time to ambulate 10 feet: <5 sec.   Gait steady and fast without use of assistive device  Cognitive Function: MMSE - Mini Mental State Exam 12/31/2020 12/03/2020 05/24/2018 05/29/2017 04/10/2017  Not completed: - - - - Unable to complete  Orientation to time 1 1 0 2 2  Orientation to Place 5 5 3 4 4   Registration 3 3 3 3 3   Attention/ Calculation 0 0 0 0 0  Recall 3 3 0 2 2  Language- name 2 objects 2 2 2 2 2   Language- repeat 1 1 1 1 1   Language- follow 3 step command 3 3 2 3 2   Language- read & follow direction 1 1 0 0 -  Write a sentence 1 1 0 0 -  Copy design 0 0 0 0 -  Total score 20 20 11 17  -   Immunizations Immunization History  Administered Date(s) Administered  . Influenza, High Dose Seasonal PF 08/03/2018, 08/09/2019  . Influenza,inj,Quad PF,6+ Mos 09/23/2013, 10/24/2014, 07/13/2017  . Influenza-Unspecified 08/13/2019  . Moderna Sars-Covid-2 Vaccination 11/25/2019, 12/26/2019  . Pneumococcal Conjugate-13  12/06/2015, 07/13/2017  . Pneumococcal Polysaccharide-23 09/23/2013  . Tdap 09/20/2018  . Zoster Recombinat (Shingrix) 08/03/2018, 11/09/2018   TDAP status: Up to date  Flu Vaccine status: Up to date - Completed today.   Pneumococcal vaccine status: Up to date  Covid-19 vaccine status: Completed vaccines - Completed primary series. Needs his booster.   Qualifies for Shingles Vaccine? Already has taken these.    Screening Tests Health Maintenance  Topic Date Due  . INFLUENZA VACCINE  05/13/2020  . COVID-19 Vaccine (3 - Booster for Moderna series) 06/27/2020  . TETANUS/TDAP  09/20/2028  . PNA vac Low Risk Adult  Completed  . HPV VACCINES  Aged Out   Health Maintenance  Health Maintenance Due  Topic Date Due  . INFLUENZA VACCINE  05/13/2020  . COVID-19 Vaccine (3 - Booster for Moderna series) 06/27/2020   Colorectal cancer screening: No longer required.   Additional Screening:  Vision Screening: Recommended annual ophthalmology exams for early detection of glaucoma and other disorders of the eye. Is the patient up to date with their annual eye exam?  Yes - upcoming appt with Dr. 11/11/2018 later this month  Who is the provider or what is the name of the office in which the patient attends annual eye exams? 07/13/2020  Dental Screening: Recommended annual dental exams for proper oral hygiene  Community Resource Referral / Chronic Care Management: CRR required this visit?  No   CCM required this visit?  No      Plan:     I have personally reviewed and noted the following in the patient's chart:   . Medical and social history . Use of alcohol, tobacco or illicit drugs  . Current medications and supplements . Functional ability and status . Nutritional status . Physical activity . Advanced directives . List of other physicians . Hospitalizations, surgeries, and ER visits in previous 12 months . Vitals . Screenings to include cognitive, depression, and  falls .  Referrals and appointments  In addition, I have reviewed and discussed with patient certain preventive protocols, quality metrics, and best practice recommendations. A written personalized care plan for preventive services as well as general preventive health recommendations were provided to patient.   Drucilla Chalet, RPH-CPP   12/31/2020

## 2021-01-10 DIAGNOSIS — H401233 Low-tension glaucoma, bilateral, severe stage: Secondary | ICD-10-CM | POA: Diagnosis not present

## 2021-01-10 DIAGNOSIS — Z961 Presence of intraocular lens: Secondary | ICD-10-CM | POA: Diagnosis not present

## 2021-02-27 ENCOUNTER — Ambulatory Visit: Payer: Medicare Other | Admitting: Physician Assistant

## 2021-02-27 ENCOUNTER — Other Ambulatory Visit: Payer: Self-pay

## 2021-02-27 VITALS — BP 128/69 | HR 70 | Temp 98.2°F | Resp 18 | Ht 70.0 in | Wt 132.0 lb

## 2021-02-27 DIAGNOSIS — R195 Other fecal abnormalities: Secondary | ICD-10-CM

## 2021-02-27 DIAGNOSIS — F0390 Unspecified dementia without behavioral disturbance: Secondary | ICD-10-CM | POA: Diagnosis not present

## 2021-02-27 DIAGNOSIS — F03A0 Unspecified dementia, mild, without behavioral disturbance, psychotic disturbance, mood disturbance, and anxiety: Secondary | ICD-10-CM

## 2021-02-27 NOTE — Patient Instructions (Signed)
To start evaluating the reason for your green stool, we are going to check your kidney and liver function.  I do not feel that this is infectious since you are only having 1 episode a day and it is a loose stool and not diarrhea.  I do encourage you to increase your fiber intake to see if this helps firm of your stools.  I also encourage you to pay attention to the foods and beverages you are consuming to make sure there is not anything else that may be causing the color change.  We will call you with the lab results  Roney Jaffe, PA-C Physician Assistant [facility] [email or web address]    Fiber Content in Foods Fiber is a substance that is found in plant foods, such as fruits, vegetables, whole grains, nuts, seeds, and beans. As part of your treatment and recovery plan, your health care provider may recommend that you eat foods that have specific amounts of dietary fiber. Some conditions may require a high-fiber diet while others may require a low-fiber diet. This sheet gives you information about the dietary fiber content of some common foods. Your health care provider will tell you how much fiber you need in your diet. If you have problems or questions, contact your health care provider or dietitian. What foods are high in fiber? Fruits  Blackberries or raspberries (fresh) --  cup (75 g) has 4 g of fiber.  Pear (fresh) -- 1 medium (180 g) has 5.5 g of fiber.  Prunes (dried) -- 6 to 8 pieces (57-76 g) has 5 g of fiber.  Apple with skin -- 1 medium (182 g) has 4.8 g of fiber.  Guava -- 1 cup (128 g) has 8.9 g of fiber. Vegetables  Peas (frozen) --  cup (80 g) has 4.4 g of fiber.  Potato with skin (baked) -- 1 medium (173 g) has 4.4 g of fiber.  Pumpkin (canned) --  cup (122 g) has 5 g of fiber.  Brussels sprouts (cooked) --  cup (78 g) has 4 g of fiber.  Sweet potato --  cup mashed (124 g) has 4 g of fiber.  Winter  squash -- 1 cup cooked (205 g) has 5.7 g of fiber. Grains  Bran cereal --  cup (31 g) has 8.6 g of fiber.  Bulgur (cooked) --  cup (70 g) has 4 g of fiber.  Quinoa (cooked) -- 1 cup (185 g) has 5.2 g of fiber.  Popcorn -- 3 cups (375 g) popped has 5.8 g of fiber.  Spaghetti, whole wheat -- 1 cup (140 g) has 6 g of fiber. Meats and other proteins  Pinto beans (cooked) --  cup (90 g) has 7.7 g of fiber.  Lentils (cooked) --  cup (90 g) has 7.8 g of fiber.  Kidney beans (canned) --  cup (92.5 g) has 5.7 g of fiber.  Soybeans (canned, frozen, or fresh) --  cup (92.5 g) has 5.2 g of fiber.  Baked beans, plain or vegetarian (canned) --  cup (130 g) has 5.2 g of fiber.  Garbanzo beans or chickpeas (canned) --  cup (90 g) has 6.6 g of fiber.  Black beans (cooked) --  cup (86 g) has 7.5 g of fiber.  White beans or navy beans (cooked) --  cup (91 g) has 9.3 g of fiber. The items listed above may not be a complete list of foods with high fiber. Actual amounts of fiber may be different  depending on processing. Contact a dietitian for more information.   What foods are moderate in fiber? Fruits  Banana -- 1 medium (126 g) has 3.2 g of fiber.  Melon -- 1 cup (155 g) has 1.4 g of fiber.  Orange -- 1 small (154 g) has 3.7 g of fiber.  Raisins --  cup (40 g) has 1.8 g of fiber.  Applesauce, sweetened --  cup (125 g) has 1.5 g of fiber.  Blueberries (fresh) --  cup (75 g) has 1.8 g of fiber.  Strawberries (fresh, sliced) -- 1 cup (150 g) has 3 g of fiber.  Cherries -- 1 cup (140 g) has 2.9 g of fiber. Vegetables  Broccoli (cooked) --  cup (77.5 g) has 2.1 g of fiber.  Carrots (cooked) --  cup (77.5 g) has 2.2 g of fiber.  Corn (canned or frozen) --  cup (82.5 g) has 2.1 g of fiber.  Potatoes, mashed --  cup (105 g) has 1.6 g of fiber.  Tomato -- 1 medium (62 g) has 1.5 g of fiber.  Green beans (canned) --  cup (83 g) has 2 g of fiber.  Squash, winter --   cup (58 g) has 1 g of fiber.  Sweet potato, baked -- 1 medium (150 g) has 3 g of fiber.  Cauliflower (cooked) -- 1/2 cup (90 g) has 2.3 g of fiber. Grains  Long-grain brown rice (cooked) -- 1 cup (196 g) has 3.5 g of fiber.  Bagel, plain -- one 4-inch (10 cm) bagel has 2 g of fiber.  Instant oatmeal --  cup (120 g) has about 2 g of fiber.  Macaroni noodles, enriched (cooked) -- 1 cup (140 g) has 2.5 g of fiber.  Multigrain cereal --  cup (15 g) has about 2-4 g of fiber.  Whole-wheat bread -- 1 slice (26 g) has 2 g of fiber.  Whole-wheat spaghetti noodles --  cup (70 g) has 3.2 g of fiber.  Corn tortilla -- one 6-inch (15 cm) tortilla has 1.5 g of fiber. Meats and other proteins  Almonds --  cup or 1 oz (28 g) has 3.5 g of fiber.  Sunflower seeds in shell --  cup or  oz (11.5 g) has 1.1 g of fiber.  Vegetable or soy patty -- 1 patty (70 g) has 3.4 g of fiber.  Walnuts --  cup or 1 oz (30 g) has 2 g of fiber.  Flax seed -- 1 Tbsp (7 g) has 2.8 g of fiber. The items listed above may not be a complete list of foods that have moderate amounts of fiber. Actual amounts of fiber may be different depending on processing. Contact a dietitian for more information.   What foods are low in fiber? Low-fiber foods contain less than 1 g of fiber per serving. They include: Fruits  Fruit juice --  cup or 4 fl oz (118 mL) has 0.5 g of fiber. Vegetables  Lettuce -- 1 cup (35 g) has 0.5 g of fiber.  Cucumber (slices) --  cup (60 g) has 0.3 g of fiber.  Celery -- 1 stalk (40 g) has 0.1 g of fiber. Grains  Flour tortilla -- one 6-inch (15 cm) tortilla has 0.5 g of fiber.  White rice (cooked) --  cup (81.5 g) has 0.3 g of fiber. Meats and other proteins  Egg -- 1 large (50 g) has 0 g of fiber.  Meat, poultry, or fish -- 3 oz (85 g)  has 0 g of fiber. Dairy  Milk -- 1 cup or 8 fl oz (237 mL) has 0 g of fiber.  Yogurt -- 1 cup (245 g) has 0 g of fiber. The items listed  above may not be a complete list of foods that are low in fiber. Actual amounts of fiber may be different depending on processing. Contact a dietitian for more information.   Summary  Fiber is a substance that is found in plant foods, such as fruits, vegetables, whole grains, nuts, seeds, and beans.  As part of your treatment and recovery plan, your health care provider may recommend that you eat foods that have specific amounts of dietary fiber. This information is not intended to replace advice given to you by your health care provider. Make sure you discuss any questions you have with your health care provider. Document Revised: 02/02/2020 Document Reviewed: 02/02/2020 Elsevier Patient Education  2021 [employer].

## 2021-02-27 NOTE — Progress Notes (Signed)
Established Patient Office Visit  Subjective:  Patient ID: Brandon Taylor, male    DOB: [birth year]  Age: 80 y.o. MRN: [id number]  CC:  Chief Complaint  Patient presents with  . Stool Color Change    Green for 3 weeks per patient    HPI Brandon Taylor reports that he has been experiencing loose stools for the past 3 weeks, describes them as green in color.  Reports he is having 1 bowel movement a day, denies abdominal pain or cramping, nighttime awakenings, recent antibiotic use, blood in the stool or on the tissue.  States that he is currently living by himself, wife is living with granddaughter due to her health condition.  Wife does add to history that she is not with him to see his bowel movements or his oral intake.    Patient states that he is drinking approximately 12 bottles of water a day, will only drink an occasional [facility] and is only had 1 yellow Gatorade in the last 3 weeks.  Reports he is only eating [place] fries and not eating any dark greens.  Is not drinking alcohol.   Past Medical History:  Diagnosis Date  . ETOH abuse   . GERD (gastroesophageal reflux disease)     Past Surgical History:  Procedure Laterality Date  . NO PAST SURGERIES      Family History  Problem Relation Age of Onset  . COPD Brother     Social History   Socioeconomic History  . Marital status: Married    Spouse name: Not on file  . Number of children: Not on file  . Years of education: Not on file  . Highest education level: Not on file  Occupational History  . Not on file  Tobacco Use  . Smoking status: Former Smoker    Packs/day: 2.00    Years: 40.00    Pack years: 80.00    Quit date: 10/13/2012    Years since quitting: 8.3  . Smokeless tobacco: Never Used  Substance and Sexual Activity  . Alcohol use: Not Currently    Comment: fifth daily  . Drug use: No  . Sexual activity: Not on file  Other Topics Concern  . 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 Difficulty of Paying Living Expenses: Not very hard  Food Insecurity: No Food Insecurity  . Worried About Charity fundraiser in the Last Year: Never true  . Ran Out of Food in the Last Year: Never true  Transportation Needs: No Transportation Needs  . Lack of Transportation (Medical): No  . Lack of Transportation (Non-Medical): No  Physical Activity: Sufficiently Active  . Days of Exercise per Week: 7 days  . Minutes of Exercise per Session: 30 min  Stress: No Stress Concern Present  . Feeling of Stress : Not at all  Social Connections: Socially Integrated  . Frequency of Communication with Friends and Family: Three times a week  . Frequency of Social Gatherings with Friends and Family: Three times a week  . Attends Religious Services: More than 4 times per year  . Active Member of Clubs or Organizations: Yes  . Attends Archivist Meetings: More  than 4 times per year  . Marital Status: Married  Human resources officer Violence: Not At Risk  . Fear of Current or Ex-Partner: No  . Emotionally Abused: No  . Physically Abused: No  . Sexually Abused: No    Outpatient Medications Prior to Visit  Medication Sig Dispense Refill  . amLODipine (NORVASC) 10 MG tablet Take 1 tablet (10 mg total) by mouth daily. 90 tablet 1  . ASPIRIN ADULT LOW STRENGTH 81 MG EC tablet TAKE 1 TABLET BY MOUTH EVERY DAY (Patient taking differently: Take 81 mg by mouth daily.) 90 tablet 0  . cyanocobalamin ([facility] VITAMIN B12) 1000 MCG tablet Take 1 tablet (1,000 mcg total) by mouth daily. 100 tablet 1  . pantoprazole (PROTONIX) 40 MG tablet Take 1 tablet (40 mg total) by mouth daily. 90 tablet 1  . pravastatin (PRAVACHOL) 20 MG tablet Take 1 tablet (20 mg total) by mouth daily. 90 tablet 1    No facility-administered medications prior to visit.    No Known Allergies  ROS Review of Systems  Constitutional: Negative for chills and fever.  HENT: Negative.   Eyes: Negative.   Respiratory: Negative for shortness of breath.   Cardiovascular: Negative for chest pain.  Gastrointestinal: Negative for abdominal pain, blood in stool, constipation, diarrhea, nausea and vomiting.  Endocrine: Negative.   Genitourinary: Negative.   Musculoskeletal: Negative.   Skin: Negative.   Allergic/Immunologic: Negative.   Neurological: Negative.   Hematological: Negative.   Psychiatric/Behavioral: Positive for confusion.      Objective:    Physical Exam Vitals and nursing note reviewed.  Constitutional:      Appearance: Normal appearance.  HENT:     Head: Normocephalic and atraumatic.     Right Ear: External ear normal.     Left Ear: External ear normal.     Nose: Nose normal.     Mouth/Throat:     Mouth: Mucous membranes are moist.     Pharynx: Oropharynx is clear.  Eyes:     Extraocular Movements: Extraocular movements intact.     Conjunctiva/sclera: Conjunctivae normal.     Pupils: Pupils are equal, round, and reactive to light.  Cardiovascular:     Rate and Rhythm: Normal rate and regular rhythm.     Pulses: Normal pulses.     Heart sounds: Normal heart sounds.  Pulmonary:     Effort: Pulmonary effort is normal.     Breath sounds: Normal breath sounds.  Abdominal:     General: Abdomen is flat.     Palpations: Abdomen is soft.     Tenderness: There is no abdominal tenderness.  Musculoskeletal:        General: Normal range of motion.     Cervical back: Normal range of motion and neck supple.  Skin:    General: Skin is warm and dry.  Neurological:     General: No focal deficit present.     Mental Status: He is alert and oriented to person, place, and time.  Psychiatric:        Mood and Affect: Mood normal.        Behavior: Behavior normal.        Thought  Content: Thought content normal.        Judgment: Judgment normal.     BP 128/69 (BP Location: Left Arm, Patient Position: Sitting, Cuff Size: Normal)   Pulse 70   Temp 98.2 F (36.8 C) (Oral)   Resp 18   Ht 5' 10"  (1.778 m)  Wt 132 lb (59.9 kg)   SpO2 100%   BMI 18.94 kg/m  Wt Readings from Last 3 Encounters:  02/27/21 132 lb (59.9 kg)  12/31/20 135 lb 12.8 oz (61.6 kg)  12/03/20 133 lb 6.4 oz (60.5 kg)     Health Maintenance Due  Topic Date Due  . COVID-19 Vaccine (3 - Booster for Moderna series) 05/27/2020    There are no preventive care reminders to display for this patient.  Lab Results  Component Value Date   TSH 1.080 05/06/2017   Lab Results  Component Value Date   WBC 6.7 12/03/2020   HGB 12.6 (L) 12/03/2020   HCT 36.1 (L) 12/03/2020   MCV 88 12/03/2020   PLT 234 12/03/2020   Lab Results  Component Value Date   NA 142 02/27/2021   K 5.1 02/27/2021   CO2 23 12/03/2020   GLUCOSE 91 02/27/2021   BUN 15 02/27/2021   CREATININE 1.39 (H) 02/27/2021   BILITOT 0.4 02/27/2021   ALKPHOS 69 02/27/2021   AST 22 02/27/2021   ALT 8 12/03/2020   PROT 7.3 02/27/2021   ALBUMIN 4.6 02/27/2021   CALCIUM 9.4 02/27/2021   ANIONGAP 10 02/25/2020   EGFR 51 (L) 02/27/2021   Lab Results  Component Value Date   CHOL 201 (H) 12/03/2020   Lab Results  Component Value Date   HDL 83 12/03/2020   Lab Results  Component Value Date   LDLCALC 105 (H) 12/03/2020   Lab Results  Component Value Date   TRIG 72 12/03/2020   Lab Results  Component Value Date   CHOLHDL 2.4 12/03/2020   Lab Results  Component Value Date   HGBA1C 5.8 (H) 02/17/2017      Assessment & Plan:   Problem List Items Addressed This Visit      Nervous and Auditory   Mild dementia ([place])    Other Visit Diagnoses    Green stool    -  Primary   Relevant Orders   Comp. Metabolic Panel (12) (Completed)    1. Green stool Encouraged family to help monitor verification of bowel  movements, oral intake.  Evaluate kidney and liver function, consider stool studies if no improvement.  Encouraged patient to increase fiber intake. - Comp. Metabolic Panel (12)  2. Mild dementia ([place])   I have reviewed the patient's medical history (PMH, PSH, Social History, Family History, Medications, and allergies) , and have been updated if relevant. I spent 23 minutes reviewing chart and  face to face time with patient.     No orders of the defined types were placed in this encounter.   Follow-up: Return if symptoms worsen or fail to improve.    Loraine Grip Mayers, PA-C

## 2021-02-27 NOTE — Progress Notes (Signed)
Patient took medication today but has yet to eat. Patient states green stool began 3 weeks ago. Patient reports only eating french fries and denies eating dark leafy greens. Patient denies pain at this time.

## 2021-02-28 DIAGNOSIS — R195 Other fecal abnormalities: Secondary | ICD-10-CM | POA: Insufficient documentation

## 2021-02-28 LAB — COMP. METABOLIC PANEL (12)
AST: 22 IU/L (ref 0–40)
Albumin/Globulin Ratio: 1.7 (ref 1.2–2.2)
Albumin: 4.6 g/dL (ref 3.6–4.6)
Alkaline Phosphatase: 69 IU/L (ref 44–121)
BUN/Creatinine Ratio: 11 (ref 10–24)
BUN: 15 mg/dL (ref 8–27)
Bilirubin Total: 0.4 mg/dL (ref 0.0–1.2)
Calcium: 9.4 mg/dL (ref 8.6–10.2)
Chloride: 105 mmol/L (ref 96–106)
Creatinine, Ser: 1.39 mg/dL — ABNORMAL HIGH (ref 0.76–1.27)
Globulin, Total: 2.7 g/dL (ref 1.5–4.5)
Glucose: 91 mg/dL (ref 65–99)
Potassium: 5.1 mmol/L (ref 3.5–5.2)
Sodium: 142 mmol/L (ref 134–144)
Total Protein: 7.3 g/dL (ref 6.0–8.5)
eGFR: 51 mL/min/{1.73_m2} — ABNORMAL LOW (ref >59)

## 2021-03-07 ENCOUNTER — Telehealth: Payer: Self-pay | Admitting: *Deleted

## 2021-03-07 NOTE — Telephone Encounter (Signed)
-----   Message from Roney Jaffe, [place] sent at 02/28/2021 12:57 PM EDT ----- Please call patient and let him know that his metabolic panel did not show any reason for his changing stool color.  Please encourage him to continue to increase his fiber, if green stool persists, we will order stool studies.

## 2021-03-07 NOTE — Telephone Encounter (Signed)
Patient verified DOB Patients wife is aware of no concerns showing any reasons to support the green stool concern. Patients wife shares she has been taking balanced plates of food to the patient and he has not mentioned any green stool. Patients wife shares in the 12's patient had an intestinal operation which has left the patient with "loose stool" on a daily, patients wife was encouraged to increase patients fiber.

## 2021-05-02 ENCOUNTER — Ambulatory Visit: Payer: Medicare Other | Attending: Internal Medicine | Admitting: Physician Assistant

## 2021-05-02 ENCOUNTER — Other Ambulatory Visit: Payer: Self-pay

## 2021-05-02 ENCOUNTER — Encounter: Payer: Self-pay | Admitting: Physician Assistant

## 2021-05-02 VITALS — BP 106/65 | HR 84 | Resp 16 | Wt 125.4 lb

## 2021-05-02 DIAGNOSIS — K219 Gastro-esophageal reflux disease without esophagitis: Secondary | ICD-10-CM | POA: Diagnosis not present

## 2021-05-02 DIAGNOSIS — E785 Hyperlipidemia, unspecified: Secondary | ICD-10-CM | POA: Diagnosis not present

## 2021-05-02 DIAGNOSIS — I10 Essential (primary) hypertension: Secondary | ICD-10-CM | POA: Diagnosis not present

## 2021-05-02 DIAGNOSIS — E538 Deficiency of other specified B group vitamins: Secondary | ICD-10-CM

## 2021-05-02 MED ORDER — AMLODIPINE BESYLATE 10 MG PO TABS: 10.0000 mg | ORAL_TABLET | Freq: Every day | ORAL | 1 refills | Status: DC

## 2021-05-02 MED ORDER — PANTOPRAZOLE SODIUM 40 MG PO TBEC: 40.0000 mg | DELAYED_RELEASE_TABLET | Freq: Every day | ORAL | 1 refills | Status: DC

## 2021-05-02 MED ORDER — CYANOCOBALAMIN 1000 MCG PO TABS: 1000.0000 ug | ORAL_TABLET | Freq: Every day | ORAL | 1 refills | Status: DC

## 2021-05-02 MED ORDER — PRAVASTATIN SODIUM 20 MG PO TABS: 20.0000 mg | ORAL_TABLET | Freq: Every day | ORAL | 1 refills | Status: DC

## 2021-05-02 NOTE — Progress Notes (Signed)
Patient ID: Brandon Taylor, male   DOB: 07-02-1941, 80 y.o.   MRN: [id number]   Brandon Taylor, is a 80 y.o. male  DDU:[id number]  [id number]:[id number]  DOB - 11-09-1940  Subjective:  Chief Complaint and HPI: Brandon Taylor is a 79 y.o. male here today for med RF.  He has been doing well with the increase of amlodipine from 5 to 10mg  and BP is now controlled.  His wife is here with him.  No new changes in memory or new concerns.    ROS:   Constitutional:  No f/c, No night sweats, No unexplained weight loss. EENT:  No vision changes, No blurry vision, No hearing changes. No mouth, throat, or ear problems.  Respiratory: No cough, No SOB Cardiac: No CP, no palpitations GI:  No abd pain, No N/V/D. GU: No Urinary s/sx Musculoskeletal: No joint pain Neuro: No headache, no dizziness, no motor weakness.  Skin: No rash Endocrine:  No polydipsia. No polyuria.  Psych: Denies SI/HI  No problems updated.  ALLERGIES: No Known Allergies  PAST MEDICAL HISTORY: Past Medical History:  Diagnosis Date   ETOH abuse    GERD (gastroesophageal reflux disease)     MEDICATIONS AT HOME: Prior to Admission medications   Medication Sig Start Date End Date Taking? Authorizing Provider  ASPIRIN ADULT Taylor STRENGTH 81 MG EC tablet TAKE 1 TABLET BY MOUTH EVERY DAY Patient taking differently: Take 81 mg by mouth daily. 04/19/19  Yes 06/20/19, MD  amLODipine (NORVASC) 10 MG tablet Take 1 tablet (10 mg total) by mouth daily. 05/02/21   05/04/21, PA-C  cyanocobalamin ([facility]S VITAMIN B12) 1000 MCG tablet Take 1 tablet (1,000 mcg total) by mouth daily. 05/02/21   05/04/21, PA-C  pantoprazole (PROTONIX) 40 MG tablet Take 1 tablet (40 mg total) by mouth daily. 05/02/21   05/04/21, PA-C  pravastatin (PRAVACHOL) 20 MG tablet Take 1 tablet (20 mg total) by mouth daily. 05/02/21   05/04/21, PA-C     Objective:  EXAM:   Vitals:   05/02/21 1116  BP: 106/65  Pulse: 84  Resp: 16  SpO2: 97%   Weight: 125 lb 6.4 oz (56.9 kg)    General appearance : A&OX3. NAD. Non-toxic-appearing; thin HEENT: Atraumatic and Normocephalic.  PERRLA. EOM intact.  Neck: supple, no JVD. No cervical lymphadenopathy. No thyromegaly Chest/Lungs:  Breathing-non-labored, Good air entry bilaterally, breath sounds normal without rales, rhonchi, or wheezing  CVS: S1 S2 regular, no murmurs, gallops, rubs  Extremities: Bilateral Lower Ext shows no edema, both legs are warm to touch with = pulse throughout Neurology:  CN II-XII grossly intact, Non focal.   Psych:  TP linear. J/I fair. Normal speech. Appropriate eye contact and affect.  Skin:  No Rash  Data Review Lab Results  Component Value Date   HGBA1C 5.8 (H) 02/17/2017   HGBA1C 5.90 10/24/2014   HGBA1C 5.6 09/22/2013     Assessment & Plan   1. Essential hypertension controlled - Basic metabolic panel - amLODipine (NORVASC) 10 MG tablet; Take 1 tablet (10 mg total) by mouth daily.  Dispense: 90 tablet; Refill: 1  2. Hyperlipidemia, unspecified hyperlipidemia type Lipids 11/2020 and LFT ok - pravastatin (PRAVACHOL) 20 MG tablet; Take 1 tablet (20 mg total) by mouth daily.  Dispense: 90 tablet; Refill: 1  3. Vitamin B12 deficiency - B12 and Folate Panel - CBC with Differential/Platelet - cyanocobalamin ([facility]S VITAMIN B12) 1000 MCG tablet; Take 1 tablet (1,000 mcg total)  by mouth daily.  Dispense: 100 tablet; Refill: 1  4. Gastroesophageal reflux disease - CBC with Differential/Platelet - pantoprazole (PROTONIX) 40 MG tablet; Take 1 tablet (40 mg total) by mouth daily.  Dispense: 90 tablet; Refill: 1   Patient have been counseled extensively about nutrition and exercise  Return for 5-6 months with Dr Laural Benes for chrocnic conditions and check up.  The patient was given clear instructions to go to ER or return to medical center if symptoms don't improve, worsen or new problems develop. The patient verbalized understanding. The patient was told  to call to get lab results if they haven't heard anything in the next week.     Georgian Co, PA-C [facility] and [facility] [place], [place] [phone]   05/02/2021, 11:35 AM

## 2021-05-03 LAB — B12 AND FOLATE PANEL
Folate: 16.2 ng/mL (ref >3.0)
Vitamin B-12: 776 pg/mL (ref 232–1245)

## 2021-05-03 LAB — BASIC METABOLIC PANEL
BUN/Creatinine Ratio: 14 (ref 10–24)
BUN: 25 mg/dL (ref 8–27)
CO2: 21 mmol/L (ref 20–29)
Calcium: 9.1 mg/dL (ref 8.6–10.2)
Chloride: 107 mmol/L — ABNORMAL HIGH (ref 96–106)
Creatinine, Ser: 1.81 mg/dL — ABNORMAL HIGH (ref 0.76–1.27)
Glucose: 91 mg/dL (ref 65–99)
Potassium: 4.3 mmol/L (ref 3.5–5.2)
Sodium: 143 mmol/L (ref 134–144)
eGFR: 37 mL/min/{1.73_m2} — ABNORMAL LOW (ref >59)

## 2021-05-03 LAB — CBC WITH DIFFERENTIAL/PLATELET
Basophils Absolute: 0 10*3/uL (ref 0.0–0.2)
Basos: 0 %
EOS (ABSOLUTE): 0 10*3/uL (ref 0.0–0.4)
Eos: 0 %
Hematocrit: 33.2 % — ABNORMAL LOW (ref 37.5–51.0)
Hemoglobin: 11.6 g/dL — ABNORMAL LOW (ref 13.0–17.7)
Immature Grans (Abs): 0 10*3/uL (ref 0.0–0.1)
Immature Granulocytes: 0 %
Lymphocytes Absolute: 1.2 10*3/uL (ref 0.7–3.1)
Lymphs: 27 %
MCH: 30.1 pg (ref 26.6–33.0)
MCHC: 34.9 g/dL (ref 31.5–35.7)
MCV: 86 fL (ref 79–97)
Monocytes Absolute: 0.4 10*3/uL (ref 0.1–0.9)
Monocytes: 9 %
Neutrophils Absolute: 2.9 10*3/uL (ref 1.4–7.0)
Neutrophils: 64 %
Platelets: 216 10*3/uL (ref 150–450)
RBC: 3.85 x10E6/uL — ABNORMAL LOW (ref 4.14–5.80)
RDW: 12 % (ref 11.6–15.4)
WBC: 4.5 10*3/uL (ref 3.4–10.8)

## 2021-05-09 ENCOUNTER — Ambulatory Visit: Payer: Self-pay | Admitting: *Deleted

## 2021-05-09 NOTE — Telephone Encounter (Signed)
Pt's wife Alyus Mofield called in for his lab results.   She is on the [facility].   I read her the message from Georgian Co , PA-C dated 05/08/2021 at 10:42 AM.    She did not have any questions and said she would relay the message to her husband.

## 2021-10-10 ENCOUNTER — Ambulatory Visit: Payer: Medicare Other | Attending: Internal Medicine | Admitting: Internal Medicine

## 2021-10-10 ENCOUNTER — Other Ambulatory Visit: Payer: Self-pay

## 2021-10-10 VITALS — BP 146/64 | HR 74 | Ht 70.0 in | Wt 129.8 lb

## 2021-10-10 DIAGNOSIS — I10 Essential (primary) hypertension: Secondary | ICD-10-CM

## 2021-10-10 DIAGNOSIS — E785 Hyperlipidemia, unspecified: Secondary | ICD-10-CM | POA: Diagnosis not present

## 2021-10-10 DIAGNOSIS — F0280 Dementia in other diseases classified elsewhere without behavioral disturbance: Secondary | ICD-10-CM

## 2021-10-10 DIAGNOSIS — G309 Alzheimer's disease, unspecified: Secondary | ICD-10-CM | POA: Diagnosis not present

## 2021-10-10 DIAGNOSIS — N1832 Chronic kidney disease, stage 3b: Secondary | ICD-10-CM

## 2021-10-10 DIAGNOSIS — Z23 Encounter for immunization: Secondary | ICD-10-CM | POA: Diagnosis not present

## 2021-10-10 NOTE — Patient Instructions (Signed)
Prescription to get home blood pressure monitoring device.  Try to check your blood pressure at least once a week with goal being 130/80.

## 2021-10-10 NOTE — Progress Notes (Signed)
Patient ID: Brandon Taylor, male    DOB: April 26, 1941  MRN: [id number]  CC: Hypertension   Subjective: Brandon Taylor is a 80 y.o. male who presents for chronic ds management.  Wife is with him. His concerns today include:  Pt with hx of GERD, ETOH use disorder, vit B12 def, HL, HTN, mild dementia, CKD 3, anemia chronic ds.     CKD 3: Patient with CKD stage III.  GFR since May of last year has ranged from 37-51.  Most recent level was 37 with creatinine of 1.81.  He is not on any NSAIDs. -drinks water throughout the day  HYPERTENSION Currently taking: see medication list.  He is on amlodipine 10 mg daily.  He took medicine already this morning. Med Adherence: [x]  Yes    []  No Medication side effects: []  Yes    [x]  No Adherence with salt restriction: [x]  Yes    []  No Home Monitoring?: []  Yes    [x]  No Monitoring Frequency:  Home BP results range:  SOB? []  Yes    [x]  No Chest Pain?: []  Yes    [x]  No Leg swelling?: []  Yes    [x]  No Headaches?: []  Yes    [x]  No Dizziness? []  Yes    [x]  No Comments:   HL:  taking and tolerating Pravachol  Dementia:  wife reports his memory is stable.  Independent in bathing, clothing, feeding, toileting and transfers. No incontinence.  Moving bowels regularly.  Reports good appetite.  Weight has stayed fairly stable.  HM:  due for flu shot for 2022/23 flu season.  He has had 2 COVID-19 vaccines. Patient Active Problem List   Diagnosis Date Noted   Green stool 02/28/2021   CKD (chronic kidney disease), stage III (HCC) 12/04/2020   Influenza vaccine needed 12/03/2020   Anemia, chronic disease 09/28/2019   Renal insufficiency 09/28/2019   Mixed hyperlipidemia 07/20/2017   Mild dementia 05/29/2017   Surgical site reaction 12/06/2015   Routine general medical examination at a health care facility 10/24/2014   Vitamin B12 deficiency 10/03/2013   Loss of weight 10/03/2013   Alcohol abuse 10/03/2013   Nicotine abuse 10/03/2013   Hypoglycemia  09/22/2013   General weakness 09/22/2013   GERD (gastroesophageal reflux disease)    ETOH abuse      Current Outpatient Medications on File Prior to Visit  Medication Sig Dispense Refill   amLODipine (NORVASC) 10 MG tablet Take 1 tablet (10 mg total) by mouth daily. 90 tablet 1   ASPIRIN ADULT LOW STRENGTH 81 MG EC tablet TAKE 1 TABLET BY MOUTH EVERY DAY (Patient taking differently: Take 81 mg by mouth daily.) 90 tablet 0   cyanocobalamin ([facility]S VITAMIN B12) 1000 MCG tablet Take 1 tablet (1,000 mcg total) by mouth daily. 100 tablet 1   pantoprazole (PROTONIX) 40 MG tablet Take 1 tablet (40 mg total) by mouth daily. 90 tablet 1   pravastatin (PRAVACHOL) 20 MG tablet Take 1 tablet (20 mg total) by mouth daily. 90 tablet 1   No current facility-administered medications on file prior to visit.    No Known Allergies  Social History   Socioeconomic History   Marital status: Married    Spouse name: Not on file   Number of children: Not on file   Years of education: Not on file   Highest education level: Not on file  Occupational History   Not on file  Tobacco Use   Smoking status: Former    Packs/day: 2.00  Years: 40.00    Pack years: 80.00    Types: Cigarettes    Quit date: 10/13/2012    Years since quitting: 8.9   Smokeless tobacco: Never  Substance and Sexual Activity   Alcohol use: Not Currently    Comment: fifth daily   Drug use: No   Sexual activity: Not on file  Other Topics Concern   Not on file  Social History Narrative   Pt lives in 1 story home with his wife   Has 2 adult daughters   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[employer] (?)   Social Determinants of Radio broadcast assistant Strain: Low Risk    Difficulty of Paying Living Expenses: Not very hard  Food Insecurity: No Food Insecurity   Worried About Charity fundraiser in the Last Year: Never true   Arboriculturist in the Last Year: Never true   Transportation Needs: No Transportation Needs   Lack of Transportation (Medical): No   Lack of Transportation (Non-Medical): No  Physical Activity: Sufficiently Active   Days of Exercise per Week: 7 days   Minutes of Exercise per Session: 30 min  Stress: No Stress Concern Present   Feeling of Stress : Not at all  Social Connections: Socially Integrated   Frequency of Communication with Friends and Family: Three times a week   Frequency of Social Gatherings with Friends and Family: Three times a week   Attends Religious Services: More than 4 times per year   Active Member of Clubs or Organizations: Yes   Attends Music therapist: More than 4 times per year   Marital Status: Married  Human resources officer Violence: Not At Risk   Fear of Current or Ex-Partner: No   Emotionally Abused: No   Physically Abused: No   Sexually Abused: No    Family History  Problem Relation Age of Onset   COPD Brother     Past Surgical History:  Procedure Laterality Date   NO PAST SURGERIES      ROS: Review of Systems Negative except as stated above  PHYSICAL EXAM: BP (!) 146/64    Pulse 74    Ht 5\' 10"  (1.778 m)    Wt 129 lb 12.8 oz (58.9 kg)    SpO2 99%    BMI 18.62 kg/m   Wt Readings from Last 3 Encounters:  10/10/21 129 lb 12.8 oz (58.9 kg)  05/02/21 125 lb 6.4 oz (56.9 kg)  02/27/21 132 lb (59.9 kg)   Repeat blood pressure 156/77 Physical Exam  General appearance - alert, well appearing, elderly African-American male and in no distress Mental status - normal mood, behavior, speech, dress, motor activity, and thought processes Neck - supple, no significant adenopathy Chest - clear to auscultation, no wheezes, rales or rhonchi, symmetric air entry Heart - normal rate, regular rhythm, normal S1, S2, no murmurs, rubs, clicks or gallops Extremities - peripheral pulses normal, no pedal edema, no clubbing or cyanosis   CMP Latest Ref Rng & Units 05/02/2021 02/27/2021 12/03/2020   Glucose 65 - 99 mg/dL 91 91 88  BUN 8 - 27 mg/dL 25 15 18   Creatinine 0.76 - 1.27 mg/dL 1.81(H) 1.39(H) 1.61(H)  Sodium 134 - 144 mmol/L 143 142 144  Potassium 3.5 - 5.2 mmol/L 4.3 5.1 4.2  Chloride 96 - 106 mmol/L 107(H) 105 105  CO2 20 - 29 mmol/L 21 - 23  Calcium 8.6 - 10.2  mg/dL 9.1 9.4 8.8  Total Protein 6.0 - 8.5 g/dL - 7.3 7.3  Total Bilirubin 0.0 - 1.2 mg/dL - 0.4 0.3  Alkaline Phos 44 - 121 IU/L - 69 67  AST 0 - 40 IU/L - 22 19  ALT 0 - 44 IU/L - - 8   Lipid Panel     Component Value Date/Time   CHOL 201 (H) 12/03/2020 1629   TRIG 72 12/03/2020 1629   HDL 83 12/03/2020 1629   CHOLHDL 2.4 12/03/2020 1629   CHOLHDL 2.5 01/02/2014 0947   VLDL 14 01/02/2014 0947   LDLCALC 105 (H) 12/03/2020 1629    CBC    Component Value Date/Time   WBC 4.5 05/02/2021 1140   WBC 9.6 02/25/2020 1345   RBC 3.85 (L) 05/02/2021 1140   RBC 4.03 (L) 02/25/2020 1345   HGB 11.6 (L) 05/02/2021 1140   HCT 33.2 (L) 05/02/2021 1140   PLT 216 05/02/2021 1140   MCV 86 05/02/2021 1140   MCH 30.1 05/02/2021 1140   MCH 28.8 02/25/2020 1345   MCHC 34.9 05/02/2021 1140   MCHC 31.1 02/25/2020 1345   RDW 12.0 05/02/2021 1140   LYMPHSABS 1.2 05/02/2021 1140   MONOABS 0.3 10/24/2014 1114   EOSABS 0.0 05/02/2021 1140   BASOSABS 0.0 05/02/2021 1140    ASSESSMENT AND PLAN:  1. Essential hypertension Not at goal. Continue amlodipine.  Prescription given for home blood pressure device.  Advised that he check his blood pressure at least once a week with goal being 130/80 or lower. - Basic Metabolic Panel - For home use only DME Other see comment  2. Stage 3b chronic kidney disease ([place]) Need for good blood pressure control. Continue to avoid NSAIDs. Recheck BMP today.  If GFR continues to decline, we will refer him to nephrology.  3. Hyperlipidemia, unspecified hyperlipidemia type Continue pravastatin.  4. Dementia due to Alzheimer's disease ([place]) Stable  5. Need for influenza  vaccination Given flu vaccine today. Encourage patient to get the new COVID 19 booster shot.   Patient was given the opportunity to ask questions.  Patient verbalized understanding of the plan and was able to repeat key elements of the plan.   Orders Placed This Encounter  Procedures   For home use only DME Other see comment   Basic Metabolic Panel     Requested Prescriptions    No prescriptions requested or ordered in this encounter    Return in about 4 months (around 02/08/2022) for Appt with Lurena Joiner after march 21 for Medicare Wellness visit. Karle Plumber, MD, FACP

## 2021-10-11 ENCOUNTER — Telehealth: Payer: Self-pay

## 2021-10-11 LAB — BASIC METABOLIC PANEL
BUN/Creatinine Ratio: 13 (ref 10–24)
BUN: 19 mg/dL (ref 8–27)
CO2: 26 mmol/L (ref 20–29)
Calcium: 9.1 mg/dL (ref 8.6–10.2)
Chloride: 104 mmol/L (ref 96–106)
Creatinine, Ser: 1.44 mg/dL — ABNORMAL HIGH (ref 0.76–1.27)
Glucose: 77 mg/dL (ref 70–99)
Potassium: 4.3 mmol/L (ref 3.5–5.2)
Sodium: 142 mmol/L (ref 134–144)
eGFR: 49 mL/min/{1.73_m2} — ABNORMAL LOW (ref >59)

## 2021-10-11 NOTE — Progress Notes (Signed)
Let patient and his wife know that his kidney function is not 100% but has improved compared to when checked 5 months ago.

## 2021-10-11 NOTE — Telephone Encounter (Signed)
Contacted pt to go over lab results spoke with pt wife and she doesn't have any questions or concerns

## 2021-11-01 ENCOUNTER — Other Ambulatory Visit: Payer: Self-pay | Admitting: Physician Assistant

## 2021-11-01 DIAGNOSIS — K219 Gastro-esophageal reflux disease without esophagitis: Secondary | ICD-10-CM

## 2021-11-01 DIAGNOSIS — I10 Essential (primary) hypertension: Secondary | ICD-10-CM

## 2021-11-01 NOTE — Telephone Encounter (Signed)
Requested Prescriptions  Pending Prescriptions Disp Refills   pantoprazole (PROTONIX) 40 MG tablet [Pharmacy Med Name: PANTOPRAZOLE SOD DR 40 MG TAB] 90 tablet 1    Sig: TAKE 1 TABLET BY MOUTH EVERY DAY     Gastroenterology: Proton Pump Inhibitors Passed - 11/01/2021  1:29 AM      Passed - Valid encounter within last 12 months    Recent Outpatient Visits          3 weeks ago Essential hypertension   [facility]ine Matar, [place]   6 months ago Essential hypertension   [facility] [place], Marylene Land M, [place]   10 months ago Encounter for [employer] annual wellness exam   [facility] [place], Cornelius Moras, RPH-CPP   11 months ago Essential hypertension   [facility] Marcine Matar, MD   2 years ago Essential hypertension   [facility] Marcine Matar, MD              amLODipine (NORVASC) 10 MG tablet [Pharmacy Med Name: AMLODIPINE BESYLATE 10 MG TAB] 90 tablet 1    Sig: TAKE 1 TABLET BY MOUTH EVERY DAY     Cardiovascular:  Calcium Channel Blockers Failed - 11/01/2021  1:29 AM      Failed - Last BP in normal range    BP Readings from Last 1 Encounters:  10/10/21 (!) 146/64         Passed - Valid encounter within last 6 months    Recent Outpatient Visits          3 weeks ago Essential hypertension   [facility] Marcine Matar, MD   6 months ago Essential hypertension   [facility] [place], [place], [place]   10 months ago Encounter for [employer] annual wellness exam   [facility] [place], Cornelius Moras, RPH-CPP   11 months ago Essential hypertension   [facility] Marcine Matar, MD   2 years ago Essential hypertension   [facility] Marcine Matar, MD

## 2021-11-26 ENCOUNTER — Other Ambulatory Visit: Payer: Self-pay | Admitting: Physician Assistant

## 2021-11-26 DIAGNOSIS — E538 Deficiency of other specified B group vitamins: Secondary | ICD-10-CM

## 2022-01-06 ENCOUNTER — Ambulatory Visit: Payer: Medicare Other | Attending: Internal Medicine | Admitting: Pharmacist

## 2022-01-06 ENCOUNTER — Encounter: Payer: Self-pay | Admitting: Pharmacist

## 2022-01-06 ENCOUNTER — Other Ambulatory Visit: Payer: Self-pay

## 2022-01-06 VITALS — Ht 70.0 in | Wt 132.4 lb

## 2022-01-06 DIAGNOSIS — Z0000 Encounter for general adult medical examination without abnormal findings: Secondary | ICD-10-CM

## 2022-01-06 NOTE — Progress Notes (Signed)
? ? ?Subjective:  ? Brandon Taylor is a 81 y.o. male who presents for Medicare Annual/Subsequent preventive examination. ? ?Objective:  ?  ?Today's Vitals  ? 01/06/22 0905  ?Weight: 132 lb 6.4 oz (60.1 kg)  ?Height: 5\' 10"  (1.778 m)  ? ?Body mass index is 19 kg/m?. ? ? ?  01/06/2022  ?  9:13 AM 12/31/2020  ? 11:44 AM 05/06/2017  ?  1:44 PM 02/17/2017  ?  6:00 PM 02/17/2017  ? 10:18 AM 12/06/2015  ? 12:09 PM 10/24/2014  ? 10:28 AM  ?Advanced Directives  ?Does Patient Have a Medical Advance Directive? No No No No No No No  ?Would patient like information on creating a medical advance directive? No - Patient declined No - Patient declined  No - Patient declined No - Patient declined No - patient declined information No - patient declined information  ? ?Current Medications (verified) ?Outpatient Encounter Medications as of 01/06/2022  ?Medication Sig  ? amLODipine (NORVASC) 10 MG tablet TAKE 1 TABLET BY MOUTH EVERY DAY  ? ASPIRIN ADULT LOW STRENGTH 81 MG EC tablet TAKE 1 TABLET BY MOUTH EVERY DAY (Patient taking differently: Take 81 mg by mouth daily.)  ? cyanocobalamin (CVS VITAMIN B12) 1000 MCG tablet TAKE 1 TABLET BY MOUTH EVERY DAY  ? pantoprazole (PROTONIX) 40 MG tablet TAKE 1 TABLET BY MOUTH EVERY DAY  ? pravastatin (PRAVACHOL) 20 MG tablet Take 1 tablet (20 mg total) by mouth daily.  ? ?No facility-administered encounter medications on file as of 01/06/2022.  ? ?Allergies (verified) ?Patient has no known allergies.  ? ?History: ?Past Medical History:  ?Diagnosis Date  ? ETOH abuse   ? GERD (gastroesophageal reflux disease)   ? ?Past Surgical History:  ?Procedure Laterality Date  ? NO PAST SURGERIES    ? ?Family History  ?Problem Relation Age of Onset  ? COPD Brother   ? ?Social History  ? ?Socioeconomic History  ? Marital status: Married  ?  Spouse name: Not on file  ? Number of children: Not on file  ? Years of education: Not on file  ? Highest education level: Not on file  ?Occupational History  ? Not on file  ?Tobacco  Use  ? Smoking status: Former  ?  Packs/day: 2.00  ?  Years: 40.00  ?  Pack years: 80.00  ?  Types: Cigarettes  ?  Quit date: 10/13/2012  ?  Years since quitting: 9.2  ? Smokeless tobacco: Never  ?Substance and Sexual Activity  ? Alcohol use: Not Currently  ?  Comment: fifth daily  ? Drug use: No  ? Sexual activity: Not on file  ?Other Topics Concern  ? Not on file  ?Social History Narrative  ? Pt lives in 1 story home with his wife  ? Has 2 adult daughters - live here in [place]. Attends church weekly.   ? Highest level of education: 12th grade, did not graduate  ? 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 ?Social Determinants of Health  ? ?Financial Resource Strain: Not on file  ?Food Insecurity: Not on file  ?Transportation Needs: Not on file  ?Physical Activity: Not on file  ?Stress: Not on file  ?Social Connections: Not on file  ? ?Tobacco Counseling ?Counseling given: Not Answered ? ?Clinical Intake: ? ?Pre-visit preparation completed: No ? ?Pain : No/denies pain ? ?Nutritional Risks: None, Unintentional weight loss ?Diabetes: No ? ?How often do you need to have someone help you when you read instructions, pamphlets,  or other written materials from your doctor or pharmacy?: 5 - Always ?What is the last grade level you completed in school?: graduated high school ? ?Diabetic? No ? ?Interpreter Needed?: No ? ?Comments: [facility] ? ?Activities of Daily Living ? ?  01/06/2022  ?  9:15 AM  ?In your present state of health, do you have any difficulty performing the following activities:  ?Hearing? 0  ?Vision? 0  ?Difficulty concentrating or making decisions? 1  ?Walking or climbing stairs? 0  ?Dressing or bathing? 0  ?Doing errands, shopping? 0  ?Preparing Food and eating ? N  ?Using the Toilet? N  ?In the past six months, have you accidently leaked urine? N  ?Do you have problems with loss of bowel control? N  ?Managing your Medications? N  ?Managing your Finances? Y  ?Comment Relies on his wife  ?Housekeeping or managing your  Housekeeping? N  ? ? ?Patient Care Team: ?Ladell Pier, MD as PCP - General (Internal Medicine) ? ?Indicate any recent Medical Services you may have received from other than [facility] providers in the past year (date may be approximate). ? ?   ?Assessment:  ? This is a routine wellness examination for Brandon Taylor. ? ?Hearing/Vision screen ?No results found. ? ?Dietary issues and exercise activities discussed: ?Current Exercise Habits: Home exercise routine, Type of exercise: Other - see comments;walking (yard work), Time (Minutes): 60, Frequency (Times/Week): 5, Weekly Exercise (Minutes/Week): 300, Intensity: Mild, Exercise limited by: neurologic condition(s) ? ? Goals   ?None ?  ? ?Depression Screen ? ?  01/06/2022  ?  9:14 AM 10/10/2021  ? 11:10 AM 05/02/2021  ? 11:17 AM 02/27/2021  ? 10:22 AM 12/31/2020  ? 11:45 AM 12/31/2020  ? 11:43 AM 12/03/2020  ?  3:31 PM  ?PHQ 2/9 Scores  ?PHQ - 2 Score 0 0 0 0 0 0 0  ?PHQ- 9 Score  0       ?  ?Fall Risk ? ?  01/06/2022  ?  9:13 AM 10/10/2021  ? 11:01 AM 05/02/2021  ? 11:17 AM 12/31/2020  ? 11:45 AM 12/03/2020  ?  3:31 PM  ?Fall Risk   ?Falls in the past year? 0 0 0 0 0  ?Number falls in past yr: 0  0 0 0  ?Injury with Fall? 0  0 0 0  ?Risk for fall due to :   No Fall Risks    ?Follow up    Falls evaluation completed;Education provided;Falls prevention discussed   ? ? ?FALL RISK PREVENTION PERTAINING TO THE HOME: ? ?Any stairs in or around the home? Yes - front porch steps ?If so, are there any without handrails? No  ?Home free of loose throw rugs in walkways, pet beds, electrical cords, etc? Yes  ?Adequate lighting in your home to reduce risk of falls? Yes  ? ?ASSISTIVE DEVICES UTILIZED TO PREVENT FALLS: ? ?Life alert? No  ?Use of a cane, walker or w/c? No  ?Grab bars in the bathroom? No  ?Shower chair or bench in shower? No  ?Elevated toilet seat or a handicapped toilet? No  ? ?TIMED UP AND GO: ? ?Was the test performed? Yes .  ?Length of time to ambulate 10 feet: <5 sec.  ? ?Gait  steady and fast without use of assistive device ? ?Cognitive Function: ? ?  01/06/2022  ?  9:17 AM 12/31/2020  ? 11:46 AM 12/03/2020  ?  4:28 PM 05/24/2018  ? 10:00 AM 05/29/2017  ? 10:00 AM  ?MMSE -  Mini Mental State Exam  ?Orientation to time 2 1 1  0 2  ?Orientation to Place 4 5 5 3 4   ?Registration 0 3 3 3 3   ?Attention/ Calculation 4 0 0 0 0  ?Recall 3 3 3  0 2  ?Language- name 2 objects 2 2 2 2 2   ?Language- repeat 1 1 1 1 1   ?Language- follow 3 step command 3 3 3 2 3   ?Language- read & follow direction 1 1 1  0 0  ?Write a sentence 0 1 1 0 0  ?Copy design 0 0 0 0 0  ?Total score 20 20 20 11 17   ? ?Immunizations ?Immunization History  ?Administered Date(s) Administered  ? Influenza, High Dose Seasonal PF 08/03/2018, 08/09/2019  ? Influenza,inj,Quad PF,6+ Mos 09/23/2013, 10/24/2014, 07/13/2017, 12/31/2020, 10/10/2021  ? Influenza-Unspecified 08/13/2019  ? Moderna Sars-Covid-2 Vaccination 11/25/2019, 12/26/2019  ? Pneumococcal Conjugate-13 12/06/2015, 07/13/2017  ? Pneumococcal Polysaccharide-23 09/23/2013  ? Tdap 09/20/2018  ? Zoster Recombinat (Shingrix) 08/03/2018, 11/09/2018  ? ?TDAP status: Up to date ? ?Flu Vaccine status: Up to date ? ?Pneumococcal vaccine status: Up to date ? ?Covid-19 vaccine status: Completed vaccines - Completed primary series. Needs his booster.  ? ?Qualifies for Shingles Vaccine?  Already has taken these.    ? ?Screening Tests ?Health Maintenance  ?Topic Date Due  ? COVID-19 Vaccine (3 - Booster for Moderna series) 02/20/2020  ? TETANUS/TDAP  09/20/2028  ? Pneumonia Vaccine 12+ Years old  Completed  ? INFLUENZA VACCINE  Completed  ? Zoster Vaccines- Shingrix  Completed  ? HPV VACCINES  Aged Out  ? ?Health Maintenance ? ?Health Maintenance Due  ?Topic Date Due  ? COVID-19 Vaccine (3 - Booster for Moderna series) 02/20/2020  ? ?Colorectal cancer screening: No longer required.  ? ?Additional Screening: ? ?Vision Screening: Recommended annual ophthalmology exams for early detection of glaucoma  and other disorders of the eye. ?Is the patient up to date with their annual eye exam?  Yes - upcoming appt with Dr. Katy Fitch later this month  ?Who is the provider or what is the name of the office in which t

## 2022-03-04 ENCOUNTER — Other Ambulatory Visit: Payer: Self-pay | Admitting: Internal Medicine

## 2022-03-04 DIAGNOSIS — E538 Deficiency of other specified B group vitamins: Secondary | ICD-10-CM

## 2022-03-05 NOTE — Telephone Encounter (Signed)
Requested Prescriptions  Pending Prescriptions Disp Refills  . [facility]S VITAMIN B12 1000 MCG tablet [Pharmacy Med Name: [facility]S B-12 1,000 MCG TABLET] 100 tablet 0    Sig: TAKE 1 TABLET BY MOUTH EVERY DAY     Endocrinology:  Vitamins - Vitamin B12 Failed - 03/04/2022  2:10 AM      Failed - HCT in normal range and within 360 days    Hematocrit  Date Value Ref Range Status  05/02/2021 33.2 (L) 37.5 - 51.0 % Final         Failed - HGB in normal range and within 360 days    Hemoglobin  Date Value Ref Range Status  05/02/2021 11.6 (L) 13.0 - 17.7 g/dL Final         Passed - B12 Level in normal range and within 360 days    Vitamin B-12  Date Value Ref Range Status  05/02/2021 776 232 - 1,245 pg/mL Final         Passed - Valid encounter within last 12 months    Recent Outpatient Visits          4 months ago Essential hypertension   [facility] Marcine Matar, [place]   10 months ago Essential hypertension   [facility] [place], [place], [place]   1 year ago Encounter for [employer] annual wellness exam   [facility] [place], Cornelius Moras, RPH-CPP   1 year ago Essential hypertension   [facility] Marcine Matar, [place]   2 years ago Essential hypertension   [facility] Marcine Matar, MD

## 2022-05-01 ENCOUNTER — Other Ambulatory Visit: Payer: Self-pay | Admitting: Internal Medicine

## 2022-05-01 DIAGNOSIS — I10 Essential (primary) hypertension: Secondary | ICD-10-CM

## 2022-05-01 DIAGNOSIS — K219 Gastro-esophageal reflux disease without esophagitis: Secondary | ICD-10-CM

## 2022-05-09 ENCOUNTER — Other Ambulatory Visit: Payer: Self-pay | Admitting: Internal Medicine

## 2022-05-09 DIAGNOSIS — E785 Hyperlipidemia, unspecified: Secondary | ICD-10-CM

## 2022-05-09 NOTE — Telephone Encounter (Signed)
Pts wife called and stated that they have requested a refill for pravastatin (PRAVACHOL) 20 MG tablet through the pharmacy several times and the pharmacy advised they have faxed request several times / please advise and send to [id number] [place], [place] - [address]  [address] [place] [address]  Phone:  [phone]  Fax:  [phone]

## 2022-05-09 NOTE — Telephone Encounter (Signed)
Requested medication (s) are due for refill today - expired Rx  Requested medication (s) are on the active medication list -yes  Future visit scheduled -no  Last refill: 05/02/21 #90 1RF  Notes to clinic: expired Rx, fails lab protocol  Requested Prescriptions  Pending Prescriptions Disp Refills   pravastatin (PRAVACHOL) 20 MG tablet 90 tablet 1    Sig: Take 1 tablet (20 mg total) by mouth daily.     Cardiovascular:  Antilipid - Statins Failed - 05/09/2022  9:07 AM      Failed - Lipid Panel in normal range within the last 12 months    Cholesterol, Total  Date Value Ref Range Status  12/03/2020 201 (H) 100 - 199 mg/dL Final   LDL Chol Calc (NIH)  Date Value Ref Range Status  12/03/2020 105 (H) 0 - 99 mg/dL Final   HDL  Date Value Ref Range Status  12/03/2020 83 >39 mg/dL Final   Triglycerides  Date Value Ref Range Status  12/03/2020 72 0 - 149 mg/dL Final         Passed - Patient is not pregnant      Passed - Valid encounter within last 12 months    Recent Outpatient Visits           7 months ago Essential hypertension   [facility] Marcine Matar, [place]   1 year ago Essential hypertension   [facility] [place], [place], [place]   1 year ago Encounter for [employer] annual wellness exam   [facility] [place], Cornelius Moras, RPH-CPP   1 year ago Essential hypertension   [facility] Marcine Matar, MD   2 years ago Essential hypertension   [facility] Marcine Matar, MD                 Requested Prescriptions  Pending Prescriptions Disp Refills   pravastatin (PRAVACHOL) 20 MG tablet 90 tablet 1    Sig: Take 1 tablet (20 mg total) by mouth daily.     Cardiovascular:  Antilipid - Statins Failed - 05/09/2022  9:07 AM      Failed - Lipid Panel in normal range within the last 12 months    Cholesterol,  Total  Date Value Ref Range Status  12/03/2020 201 (H) 100 - 199 mg/dL Final   LDL Chol Calc (NIH)  Date Value Ref Range Status  12/03/2020 105 (H) 0 - 99 mg/dL Final   HDL  Date Value Ref Range Status  12/03/2020 83 >39 mg/dL Final   Triglycerides  Date Value Ref Range Status  12/03/2020 72 0 - 149 mg/dL Final         Passed - Patient is not pregnant      Passed - Valid encounter within last 12 months    Recent Outpatient Visits           7 months ago Essential hypertension   [facility] Marcine Matar, [place]   1 year ago Essential hypertension   [facility] [place], [place], [place]   1 year ago Encounter for [employer] annual wellness exam   [facility] [place], Cornelius Moras, RPH-CPP   1 year ago Essential hypertension   [facility] Marcine Matar, MD   2 years ago Essential hypertension  [facility] Marcine Matar, MD

## 2022-05-27 ENCOUNTER — Other Ambulatory Visit: Payer: Self-pay | Admitting: Internal Medicine

## 2022-05-27 DIAGNOSIS — I10 Essential (primary) hypertension: Secondary | ICD-10-CM

## 2022-05-27 DIAGNOSIS — K219 Gastro-esophageal reflux disease without esophagitis: Secondary | ICD-10-CM

## 2022-05-27 NOTE — Telephone Encounter (Signed)
Requested Prescriptions  Pending Prescriptions Disp Refills  . amLODipine (NORVASC) 10 MG tablet [Pharmacy Med Name: AMLODIPINE BESYLATE 10 MG TAB] 30 tablet 0    Sig: TAKE 1 TABLET BY MOUTH EVERY DAY     Cardiovascular: Calcium Channel Blockers 2 Failed - 05/27/2022  2:30 PM      Failed - Last BP in normal range    BP Readings from Last 1 Encounters:  10/10/21 (!) 146/64         Passed - Last Heart Rate in normal range    Pulse Readings from Last 1 Encounters:  10/10/21 74         Passed - Valid encounter within last 6 months    Recent Outpatient Visits          7 months ago Essential hypertension   [facility] Marcine Matar, MD   1 year ago Essential hypertension   [facility] [place], [place], [place]   1 year ago Encounter for [employer] annual wellness exam   [facility] Lois Huxley, Cornelius Moras, RPH-CPP   1 year ago Essential hypertension   [facility] Marcine Matar, MD   2 years ago Essential hypertension   [facility] [name] B, MD             . pantoprazole (PROTONIX) 40 MG tablet [Pharmacy Med Name: PANTOPRAZOLE SOD DR 40 MG TAB] 30 tablet 0    Sig: TAKE 1 TABLET BY MOUTH EVERY DAY     Gastroenterology: Proton Pump Inhibitors Passed - 05/27/2022  2:30 PM      Passed - Valid encounter within last 12 months    Recent Outpatient Visits          7 months ago Essential hypertension   [facility] Marcine Matar, MD   1 year ago Essential hypertension   [facility] [place], [place], [place]   1 year ago Encounter for [employer] annual wellness exam   [facility] Lois Huxley, Cornelius Moras, RPH-CPP   1 year ago Essential hypertension    [facility]   2 years ago  Essential hypertension   [facility] Marcine Matar, MD

## 2022-06-09 ENCOUNTER — Other Ambulatory Visit: Payer: Self-pay | Admitting: Internal Medicine

## 2022-06-09 DIAGNOSIS — E538 Deficiency of other specified B group vitamins: Secondary | ICD-10-CM

## 2022-06-10 NOTE — Telephone Encounter (Signed)
Requested medication (s) are due for refill today: yes  Requested medication (s) are on the active medication list:yes  Last refill:  01/03/22  Future visit scheduled: no  Notes to clinic:  Unable to refill per protocol due to failed labs, no updated results.     Requested Prescriptions  Pending Prescriptions Disp Refills   [facility] VITAMIN B12 1000 MCG tablet [Pharmacy Med Name: [facility] B-12 1,000 MCG TABLET] 100 tablet 0    Sig: TAKE 1 TABLET BY MOUTH EVERY DAY     Endocrinology:  Vitamins - Vitamin B12 Failed - 06/09/2022  2:30 AM      Failed - HCT in normal range and within 360 days    Hematocrit  Date Value Ref Range Status  05/02/2021 33.2 (L) 37.5 - 51.0 % Final         Failed - HGB in normal range and within 360 days    Hemoglobin  Date Value Ref Range Status  05/02/2021 11.6 (L) 13.0 - 17.7 g/dL Final         Failed - B12 Level in normal range and within 360 days    Vitamin B-12  Date Value Ref Range Status  05/02/2021 776 232 - 1,245 pg/mL Final         Passed - Valid encounter within last 12 months    Recent Outpatient Visits           8 months ago Essential hypertension   [facility] Marcine Matar, MD   1 year ago Essential hypertension   [facility] [place], [place], [place]   1 year ago Encounter for [employer] annual wellness exam   [facility] Lois Huxley, Cornelius Moras, RPH-CPP   1 year ago Essential hypertension   [facility] Marcine Matar, MD   2 years ago Essential hypertension   [facility] Marcine Matar, MD

## 2022-06-24 ENCOUNTER — Other Ambulatory Visit: Payer: Self-pay | Admitting: Internal Medicine

## 2022-06-24 DIAGNOSIS — I10 Essential (primary) hypertension: Secondary | ICD-10-CM

## 2022-06-24 DIAGNOSIS — K219 Gastro-esophageal reflux disease without esophagitis: Secondary | ICD-10-CM

## 2022-06-25 ENCOUNTER — Ambulatory Visit: Payer: Self-pay | Admitting: *Deleted

## 2022-06-25 ENCOUNTER — Other Ambulatory Visit: Payer: Self-pay | Admitting: *Deleted

## 2022-06-25 DIAGNOSIS — E785 Hyperlipidemia, unspecified: Secondary | ICD-10-CM

## 2022-06-25 NOTE — Addendum Note (Signed)
Addended by: Carlean Purl on: 06/25/2022 05:02 PM   Modules accepted: Orders

## 2022-06-25 NOTE — Telephone Encounter (Signed)
Per agent: "The patient has tested positive for COVID 19 via an at home test   The patient is experiencing cough, congestion and a lack of taste   The patient's wife would like for the patient to be prescribed something for their symptoms   Please contact further when possible "   Chief Complaint: Covid Positive Symptoms: Productive cough, yellowish, generalized weakness Frequency: Symptoms onset Sunday Pertinent Negatives: Patient denies fever, sore throat, body aches,headache Disposition: [] ED /[] Urgent Care (no appt availability in office) / [] Appointment(In office/virtual)/ []  [facility]/ [] Home Care/ [] Refused Recommended Disposition /[] [place] [facility]/ [x]  Follow-up with PCP Additional Notes: Requesting oral anti virtual be called in. Declines appt. Care advise provided. Please advise  Reason for Disposition  [1] HIGH RISK patient (e.g., weak immune system, age > 64 years, obesity with BMI 30 or higher, pregnant, chronic lung disease or other chronic medical condition) AND [2] COVID symptoms (e.g., cough, fever)  (Exceptions: Already seen by PCP and no new or worsening symptoms.)  Answer Assessment - Initial Assessment Questions 1. COVID-19 DIAGNOSIS: "How do you know that you have COVID?" (e.g., positive lab test or self-test, diagnosed by doctor or NP/PA, symptoms after exposure).     Home test. Sunday 2. COVID-19 EXPOSURE: "Was there any known exposure to COVID before the symptoms began?" CDC Definition of close contact: within 6 feet (2 meters) for a total of 15 minutes or more over a 24-hour period.      Wife 3. ONSET: "When did the COVID-19 symptoms start?"      Sunday 4. WORST SYMPTOM: "What is your worst symptom?" (e.g., cough, fever, shortness of breath, muscle aches)     Cough 5. COUGH: "Do you have a cough?" If Yes, ask: "How bad is the cough?"       Yes, Yellowish. 6. FEVER: "Do you have a fever?" If Yes, ask: "What is your temperature, how was it  measured, and when did it start?"     no 7. RESPIRATORY STATUS: "Describe your breathing?" (e.g., normal; shortness of breath, wheezing, unable to speak)      no 8. BETTER-SAME-WORSE: "Are you getting better, staying the same or getting worse compared to yesterday?"  If getting worse, ask, "In what way?"     Little better 9. OTHER SYMPTOMS: "Do you have any other symptoms?"  (e.g., chills, fatigue, headache, loss of smell or taste, muscle pain, sore throat)     No, weakness 10. HIGH RISK DISEASE: "Do you have any chronic medical problems?" (e.g., asthma, heart or lung disease, weak immune system, obesity, etc.)       yes 11. VACCINE: "Have you had the COVID-19 vaccine?" If Yes, ask: "Which one, how many shots, when did you get it?"       2 vaccines and unsure about boosters  Protocols used: Coronavirus (COVID-19) Diagnosed or Suspected-A-AH

## 2022-06-26 NOTE — Telephone Encounter (Signed)
Called spoke w/ pt wife & pt informed of note per pcp to schedule telephone visit. Pt expressed understanding. Pt scheduled for telephone visit tomorrow 09/15@8 :10am w/ pcp. ----DD,RMA

## 2022-06-26 NOTE — Telephone Encounter (Signed)
Routing to PCP for review.

## 2022-06-27 ENCOUNTER — Ambulatory Visit: Payer: Medicare Other | Attending: Internal Medicine | Admitting: Internal Medicine

## 2022-06-27 ENCOUNTER — Encounter (HOSPITAL_COMMUNITY): Payer: Self-pay | Admitting: Emergency Medicine

## 2022-06-27 ENCOUNTER — Emergency Department (HOSPITAL_COMMUNITY): Payer: Medicare Other

## 2022-06-27 ENCOUNTER — Inpatient Hospital Stay (HOSPITAL_COMMUNITY)
Admission: EM | Admit: 2022-06-27 | Discharge: 2022-06-30 | DRG: 177 | Disposition: A | Discharge status: Home Health | Payer: Medicare Other | Attending: Internal Medicine | Admitting: Internal Medicine

## 2022-06-27 DIAGNOSIS — R7303 Prediabetes: Secondary | ICD-10-CM | POA: Diagnosis not present

## 2022-06-27 DIAGNOSIS — I4891 Unspecified atrial fibrillation: Secondary | ICD-10-CM | POA: Diagnosis not present

## 2022-06-27 DIAGNOSIS — E0590 Thyrotoxicosis, unspecified without thyrotoxic crisis or storm: Secondary | ICD-10-CM | POA: Diagnosis present

## 2022-06-27 DIAGNOSIS — K219 Gastro-esophageal reflux disease without esophagitis: Secondary | ICD-10-CM | POA: Diagnosis present

## 2022-06-27 DIAGNOSIS — F17210 Nicotine dependence, cigarettes, uncomplicated: Secondary | ICD-10-CM | POA: Diagnosis not present

## 2022-06-27 DIAGNOSIS — I129 Hypertensive chronic kidney disease with stage 1 through stage 4 chronic kidney disease, or unspecified chronic kidney disease: Secondary | ICD-10-CM | POA: Diagnosis present

## 2022-06-27 DIAGNOSIS — E869 Volume depletion, unspecified: Secondary | ICD-10-CM | POA: Diagnosis not present

## 2022-06-27 DIAGNOSIS — J1282 Pneumonia due to coronavirus disease 2019: Secondary | ICD-10-CM | POA: Diagnosis present

## 2022-06-27 DIAGNOSIS — A419 Sepsis, unspecified organism: Secondary | ICD-10-CM | POA: Diagnosis not present

## 2022-06-27 DIAGNOSIS — Z87891 Personal history of nicotine dependence: Secondary | ICD-10-CM | POA: Diagnosis not present

## 2022-06-27 DIAGNOSIS — I440 Atrioventricular block, first degree: Secondary | ICD-10-CM | POA: Diagnosis not present

## 2022-06-27 DIAGNOSIS — Z7982 Long term (current) use of aspirin: Secondary | ICD-10-CM | POA: Diagnosis not present

## 2022-06-27 DIAGNOSIS — U071 COVID-19: Secondary | ICD-10-CM

## 2022-06-27 DIAGNOSIS — E559 Vitamin D deficiency, unspecified: Secondary | ICD-10-CM | POA: Diagnosis present

## 2022-06-27 DIAGNOSIS — E785 Hyperlipidemia, unspecified: Secondary | ICD-10-CM

## 2022-06-27 DIAGNOSIS — E538 Deficiency of other specified B group vitamins: Secondary | ICD-10-CM | POA: Diagnosis present

## 2022-06-27 DIAGNOSIS — Z8249 Family history of ischemic heart disease and other diseases of the circulatory system: Secondary | ICD-10-CM | POA: Diagnosis not present

## 2022-06-27 DIAGNOSIS — F03A0 Unspecified dementia, mild, without behavioral disturbance, psychotic disturbance, mood disturbance, and anxiety: Secondary | ICD-10-CM | POA: Diagnosis not present

## 2022-06-27 DIAGNOSIS — R059 Cough, unspecified: Secondary | ICD-10-CM | POA: Diagnosis not present

## 2022-06-27 DIAGNOSIS — N1832 Chronic kidney disease, stage 3b: Secondary | ICD-10-CM | POA: Diagnosis not present

## 2022-06-27 DIAGNOSIS — N179 Acute kidney failure, unspecified: Secondary | ICD-10-CM | POA: Diagnosis not present

## 2022-06-27 DIAGNOSIS — I480 Paroxysmal atrial fibrillation: Secondary | ICD-10-CM | POA: Diagnosis not present

## 2022-06-27 DIAGNOSIS — Z79899 Other long term (current) drug therapy: Secondary | ICD-10-CM | POA: Diagnosis not present

## 2022-06-27 DIAGNOSIS — D72829 Elevated white blood cell count, unspecified: Secondary | ICD-10-CM | POA: Diagnosis not present

## 2022-06-27 DIAGNOSIS — I248 Other forms of acute ischemic heart disease: Secondary | ICD-10-CM | POA: Diagnosis not present

## 2022-06-27 DIAGNOSIS — N183 Chronic kidney disease, stage 3 unspecified: Secondary | ICD-10-CM | POA: Diagnosis present

## 2022-06-27 LAB — CBC WITH DIFFERENTIAL/PLATELET
Abs Immature Granulocytes: 0.35 10*3/uL — ABNORMAL HIGH (ref 0.00–0.07)
Basophils Absolute: 0.1 10*3/uL (ref 0.0–0.1)
Basophils Relative: 0 %
Eosinophils Absolute: 0 10*3/uL (ref 0.0–0.5)
Eosinophils Relative: 0 %
HCT: 43.6 % (ref 39.0–52.0)
Hemoglobin: 14.5 g/dL (ref 13.0–17.0)
Immature Granulocytes: 3 %
Lymphocytes Relative: 5 %
Lymphs Abs: 0.7 10*3/uL (ref 0.7–4.0)
MCH: 29.4 pg (ref 26.0–34.0)
MCHC: 33.3 g/dL (ref 30.0–36.0)
MCV: 88.4 fL (ref 80.0–100.0)
Monocytes Absolute: 0.9 10*3/uL (ref 0.1–1.0)
Monocytes Relative: 7 %
Neutro Abs: 10.8 10*3/uL — ABNORMAL HIGH (ref 1.7–7.7)
Neutrophils Relative %: 85 %
Platelets: 623 10*3/uL — ABNORMAL HIGH (ref 150–400)
RBC: 4.93 MIL/uL (ref 4.22–5.81)
RDW: 15.2 % (ref 11.5–15.5)
WBC: 12.7 10*3/uL — ABNORMAL HIGH (ref 4.0–10.5)
nRBC: 0.2 % (ref 0.0–0.2)

## 2022-06-27 LAB — URINALYSIS, ROUTINE W REFLEX MICROSCOPIC
Bilirubin Urine: NEGATIVE
Glucose, UA: NEGATIVE mg/dL
Ketones, ur: 5 mg/dL — AB
Leukocytes,Ua: NEGATIVE
Nitrite: NEGATIVE
Protein, ur: 30 mg/dL — AB
Specific Gravity, Urine: 1.018 (ref 1.005–1.030)
pH: 5 (ref 5.0–8.0)

## 2022-06-27 LAB — COMPREHENSIVE METABOLIC PANEL
ALT: 23 U/L (ref 0–44)
AST: 28 U/L (ref 15–41)
Albumin: 2.7 g/dL — ABNORMAL LOW (ref 3.5–5.0)
Alkaline Phosphatase: 74 U/L (ref 38–126)
Anion gap: 13 (ref 5–15)
BUN: 126 mg/dL — ABNORMAL HIGH (ref 8–23)
CO2: 21 mmol/L — ABNORMAL LOW (ref 22–32)
Calcium: 8.9 mg/dL (ref 8.9–10.3)
Chloride: 109 mmol/L (ref 98–111)
Creatinine, Ser: 2.96 mg/dL — ABNORMAL HIGH (ref 0.61–1.24)
GFR, Estimated: 20 mL/min — ABNORMAL LOW (ref >60)
Glucose, Bld: 134 mg/dL — ABNORMAL HIGH (ref 70–99)
Potassium: 4.6 mmol/L (ref 3.5–5.1)
Sodium: 143 mmol/L (ref 135–145)
Total Bilirubin: 0.9 mg/dL (ref 0.3–1.2)
Total Protein: 8 g/dL (ref 6.5–8.1)

## 2022-06-27 MED ORDER — PRAVASTATIN SODIUM 20 MG PO TABS: 20.0000 mg | ORAL_TABLET | Freq: Every day | ORAL | 0 refills | Status: DC

## 2022-06-27 NOTE — ED Notes (Signed)
Failed x 2 to collect labs

## 2022-06-27 NOTE — Progress Notes (Signed)
Virtual Visit via Telephone Note  I connected with Brandon Taylor on 06/27/2022 at 8:50 AM by telephone and verified that I am speaking with the correct person using two identifiers  Location: Patient: home Provider: office  Participants: Myself Patient and his wife.  His wife gives the history.   I discussed the limitations, risks, security and privacy concerns of performing an evaluation and management service by telephone and the availability of in person appointments. I also discussed with the patient that there may be a patient responsible charge related to this service. The patient expressed understanding and agreed to proceed.   History of Present Illness: Pt with hx of GERD, ETOH use disorder, vit B12 def, HL, HTN, mild dementia, CKD 3, anemia chronic ds.  This is an urgent care visit. Wife reports that patient was diagnosed with COVID 5 days ago with a home COVID test.  They did the test because he was feeling weak with no appetite and coughing a lot.  He has not had any fever.  Patient endorses shortness of breath.  Wife states that he has not eaten any solid food since Monday which was 4 days ago.  He has been drinking juices and water.  He is too weak to get out of bed.  He denies any headaches.  Endorses dizziness.  Wife started giving him Tylenol and Robitussin yesterday because he was complaining of pain in his side with coughing.  He has had 2 Moderna COVID-19 vaccines in the past. Wife is requesting refill on Pravachol for him.   Outpatient Encounter Medications as of 06/27/2022  Medication Sig   amLODipine (NORVASC) 10 MG tablet TAKE 1 TABLET BY MOUTH EVERY DAY   ASPIRIN ADULT LOW STRENGTH 81 MG EC tablet TAKE 1 TABLET BY MOUTH EVERY DAY (Patient taking differently: Take 81 mg by mouth daily.)   [facility] VITAMIN B12 1000 MCG tablet TAKE 1 TABLET BY MOUTH EVERY DAY   pantoprazole (PROTONIX) 40 MG tablet TAKE 1 TABLET BY MOUTH EVERY DAY   pravastatin (PRAVACHOL) 20 MG tablet Take 1  tablet (20 mg total) by mouth daily.   No facility-administered encounter medications on file as of 06/27/2022.      Observations/Objective: No direct observation done as this was a telephone visit.  Assessment and Plan: 1. COVID-19 virus infection -Given his advanced age, the fact that he has not been eating, too weak to get out of bed and is having some mild shortness of breath, I recommend that he be seen in the emergency room.  Patient initially resistant and reluctant to going to the emergency room.  However I expressed to both him and his wife that I think he will continue to go down he will if he does not get out and be seen.  Wife states that she will have to call the ambulance as she is unable to get him out since he has not been able to ambulate for the past 5 days. 2.  Hyperlipidemia Refill will be sent on Pravachol his cholesterol medication.   Follow Up Instructions: Post ER.   I discussed the assessment and treatment plan with the patient. The patient was provided an opportunity to ask questions and all were answered. The patient agreed with the plan and demonstrated an understanding of the instructions.   The patient was advised to call back or seek an in-person evaluation if the symptoms worsen or if the condition fails to improve as anticipated.  I  Spent 13 minutes on  this telephone encounter  This note has been created with Education officer, environmental. Any transcriptional errors are unintentional.  [name], MD

## 2022-06-27 NOTE — ED Triage Notes (Signed)
Patient states he was told to go to ED for evaluation of COVID-19 infection. Patient states he tested positive on Sunday, has a poor appetite and is generally weak. Patient is afebrile, room air SpO2 94% in triage. Patient is alert, oriented, speaking in complete sentences.

## 2022-06-27 NOTE — ED Provider Triage Note (Signed)
Emergency Medicine Provider Triage Evaluation Note  Brandon Taylor , a 81 y.o. male  was evaluated in triage.  Pt complains of decreased appetite weakness, and cough in the setting of being COVID-positive.  Patient tested positive on Sunday.  He reports some shortness of breath as well.  Wife at bedside reports that he has not been eating, but he has been drinking.  Denies any fever..  Review of Systems  Positive:  Negative:   Physical Exam  BP 113/73 (BP Location: Right Arm)   Pulse (!) 101   Temp 98 F (36.7 C) (Oral)   Resp (!) 26   SpO2 93%  Gen:   Awake, no distress, well-appearing Resp:  Normal effort  MSK:   Moves extremities without difficulty  Other:   Medical Decision Making  Medically screening exam initiated at 6:56 PM.  Appropriate orders placed.  Brandon Taylor was informed that the remainder of the evaluation will be completed by another provider, this initial triage assessment does not replace that evaluation, and the importance of remaining in the ED until their evaluation is complete.  Vital signs show slight tachypnea and tachycardia, will order basic labs and chest x-ray.   Brandon Taylor, [place] 06/27/22 1857

## 2022-06-28 ENCOUNTER — Inpatient Hospital Stay (HOSPITAL_COMMUNITY): Payer: Medicare Other

## 2022-06-28 ENCOUNTER — Emergency Department (HOSPITAL_COMMUNITY): Payer: Medicare Other

## 2022-06-28 ENCOUNTER — Other Ambulatory Visit: Payer: Self-pay

## 2022-06-28 ENCOUNTER — Encounter (HOSPITAL_COMMUNITY): Payer: Self-pay | Admitting: Student in an Organized Health Care Education/Training Program

## 2022-06-28 DIAGNOSIS — I480 Paroxysmal atrial fibrillation: Secondary | ICD-10-CM | POA: Diagnosis not present

## 2022-06-28 DIAGNOSIS — Z79899 Other long term (current) drug therapy: Secondary | ICD-10-CM | POA: Diagnosis not present

## 2022-06-28 DIAGNOSIS — E559 Vitamin D deficiency, unspecified: Secondary | ICD-10-CM | POA: Diagnosis not present

## 2022-06-28 DIAGNOSIS — U071 COVID-19: Secondary | ICD-10-CM | POA: Diagnosis present

## 2022-06-28 DIAGNOSIS — F17210 Nicotine dependence, cigarettes, uncomplicated: Secondary | ICD-10-CM | POA: Diagnosis present

## 2022-06-28 DIAGNOSIS — E785 Hyperlipidemia, unspecified: Secondary | ICD-10-CM | POA: Diagnosis present

## 2022-06-28 DIAGNOSIS — I248 Other forms of acute ischemic heart disease: Secondary | ICD-10-CM | POA: Diagnosis not present

## 2022-06-28 DIAGNOSIS — E538 Deficiency of other specified B group vitamins: Secondary | ICD-10-CM | POA: Diagnosis not present

## 2022-06-28 DIAGNOSIS — I129 Hypertensive chronic kidney disease with stage 1 through stage 4 chronic kidney disease, or unspecified chronic kidney disease: Secondary | ICD-10-CM | POA: Diagnosis not present

## 2022-06-28 DIAGNOSIS — D72829 Elevated white blood cell count, unspecified: Secondary | ICD-10-CM | POA: Diagnosis not present

## 2022-06-28 DIAGNOSIS — Z87891 Personal history of nicotine dependence: Secondary | ICD-10-CM

## 2022-06-28 DIAGNOSIS — J1282 Pneumonia due to coronavirus disease 2019: Secondary | ICD-10-CM | POA: Diagnosis not present

## 2022-06-28 DIAGNOSIS — N179 Acute kidney failure, unspecified: Secondary | ICD-10-CM | POA: Diagnosis not present

## 2022-06-28 DIAGNOSIS — F03A0 Unspecified dementia, mild, without behavioral disturbance, psychotic disturbance, mood disturbance, and anxiety: Secondary | ICD-10-CM | POA: Diagnosis not present

## 2022-06-28 DIAGNOSIS — Z7982 Long term (current) use of aspirin: Secondary | ICD-10-CM | POA: Diagnosis not present

## 2022-06-28 DIAGNOSIS — I440 Atrioventricular block, first degree: Secondary | ICD-10-CM | POA: Diagnosis present

## 2022-06-28 DIAGNOSIS — A419 Sepsis, unspecified organism: Secondary | ICD-10-CM | POA: Diagnosis not present

## 2022-06-28 DIAGNOSIS — E869 Volume depletion, unspecified: Secondary | ICD-10-CM | POA: Diagnosis not present

## 2022-06-28 DIAGNOSIS — K219 Gastro-esophageal reflux disease without esophagitis: Secondary | ICD-10-CM | POA: Diagnosis present

## 2022-06-28 DIAGNOSIS — R7303 Prediabetes: Secondary | ICD-10-CM | POA: Diagnosis present

## 2022-06-28 DIAGNOSIS — E0590 Thyrotoxicosis, unspecified without thyrotoxic crisis or storm: Secondary | ICD-10-CM | POA: Diagnosis not present

## 2022-06-28 DIAGNOSIS — Z8249 Family history of ischemic heart disease and other diseases of the circulatory system: Secondary | ICD-10-CM | POA: Diagnosis not present

## 2022-06-28 DIAGNOSIS — N1832 Chronic kidney disease, stage 3b: Secondary | ICD-10-CM | POA: Diagnosis not present

## 2022-06-28 DIAGNOSIS — I4891 Unspecified atrial fibrillation: Secondary | ICD-10-CM | POA: Diagnosis present

## 2022-06-28 LAB — TSH: TSH: 0.328 u[IU]/mL — ABNORMAL LOW (ref 0.350–4.500)

## 2022-06-28 LAB — TROPONIN I (HIGH SENSITIVITY)
Troponin I (High Sensitivity): 35 ng/L — ABNORMAL HIGH (ref <18)
Troponin I (High Sensitivity): 28 ng/L — ABNORMAL HIGH (ref <18)

## 2022-06-28 LAB — RESP PANEL BY RT-PCR (FLU A&B, COVID) ARPGX2
Influenza A by PCR: NEGATIVE
Influenza B by PCR: NEGATIVE
SARS Coronavirus 2 by RT PCR: POSITIVE — AB

## 2022-06-28 LAB — HEMOGLOBIN A1C
Hgb A1c MFr Bld: 6.1 % — ABNORMAL HIGH (ref 4.8–5.6)
Mean Plasma Glucose: 128.37 mg/dL

## 2022-06-28 LAB — MAGNESIUM: Magnesium: 2.1 mg/dL (ref 1.7–2.4)

## 2022-06-28 LAB — LACTIC ACID, PLASMA
Lactic Acid, Venous: 3 mmol/L (ref 0.5–1.9)
Lactic Acid, Venous: 1.1 mmol/L (ref 0.5–1.9)

## 2022-06-28 LAB — VITAMIN D 25 HYDROXY (VIT D DEFICIENCY, FRACTURES): Vit D, 25-Hydroxy: 17.22 ng/mL — ABNORMAL LOW (ref 30–100)

## 2022-06-28 LAB — T4, FREE: Free T4: 1.84 ng/dL — ABNORMAL HIGH (ref 0.61–1.12)

## 2022-06-28 LAB — BRAIN NATRIURETIC PEPTIDE: B Natriuretic Peptide: 82.3 pg/mL (ref 0.0–100.0)

## 2022-06-28 MED ORDER — SODIUM CHLORIDE 0.9 % IV BOLUS
500.0000 mL | Freq: Once | INTRAVENOUS | Status: AC
  Administered 2022-06-28: 500 mL via INTRAVENOUS

## 2022-06-28 MED ORDER — ACETAMINOPHEN 325 MG PO TABS: 650.0000 mg | ORAL_TABLET | Freq: Four times a day (QID) | ORAL | Status: DC | PRN

## 2022-06-28 MED ORDER — SODIUM CHLORIDE 0.9 % IV SOLN
1.0000 g | Freq: Once | INTRAVENOUS | Status: AC
  Administered 2022-06-28: 1 g via INTRAVENOUS
  Filled 2022-06-28: qty 10

## 2022-06-28 MED ORDER — ONDANSETRON HCL 4 MG/2ML IJ SOLN: 4.0000 mg | Freq: Four times a day (QID) | INTRAMUSCULAR | Status: DC | PRN

## 2022-06-28 MED ORDER — PRAVASTATIN SODIUM 10 MG PO TABS
20.0000 mg | ORAL_TABLET | Freq: Every day | ORAL | Status: DC
  Administered 2022-06-29 – 2022-06-30 (×2): 20 mg via ORAL
  Filled 2022-06-28 (×2): qty 2

## 2022-06-28 MED ORDER — ENOXAPARIN SODIUM 30 MG/0.3ML IJ SOSY
30.0000 mg | PREFILLED_SYRINGE | INTRAMUSCULAR | Status: DC
  Administered 2022-06-28 – 2022-06-30 (×3): 30 mg via SUBCUTANEOUS
  Filled 2022-06-28 (×3): qty 0.3

## 2022-06-28 MED ORDER — DILTIAZEM HCL 60 MG PO TABS
60.0000 mg | ORAL_TABLET | Freq: Three times a day (TID) | ORAL | Status: DC
  Administered 2022-06-28 – 2022-06-29 (×2): 60 mg via ORAL
  Filled 2022-06-28 (×2): qty 1

## 2022-06-28 MED ORDER — METHIMAZOLE 10 MG PO TABS
10.0000 mg | ORAL_TABLET | Freq: Two times a day (BID) | ORAL | Status: DC
  Administered 2022-06-28 – 2022-06-30 (×4): 10 mg via ORAL
  Filled 2022-06-28 (×5): qty 1

## 2022-06-28 MED ORDER — ONDANSETRON HCL 4 MG PO TABS: 4.0000 mg | ORAL_TABLET | Freq: Four times a day (QID) | ORAL | Status: DC | PRN

## 2022-06-28 MED ORDER — LACTATED RINGERS IV SOLN: INTRAVENOUS | Status: AC

## 2022-06-28 MED ORDER — VITAMIN B-12 1000 MCG PO TABS
1000.0000 ug | ORAL_TABLET | Freq: Every day | ORAL | Status: DC
  Administered 2022-06-28 – 2022-06-30 (×3): 1000 ug via ORAL
  Filled 2022-06-28 (×3): qty 1

## 2022-06-28 MED ORDER — GUAIFENESIN ER 600 MG PO TB12
600.0000 mg | ORAL_TABLET | Freq: Two times a day (BID) | ORAL | Status: DC | PRN
  Administered 2022-06-30: 600 mg via ORAL
  Filled 2022-06-28: qty 1

## 2022-06-28 MED ORDER — ACETAMINOPHEN 650 MG RE SUPP: 650.0000 mg | Freq: Four times a day (QID) | RECTAL | Status: DC | PRN

## 2022-06-28 MED ORDER — ASPIRIN 81 MG PO TBEC
81.0000 mg | DELAYED_RELEASE_TABLET | Freq: Every day | ORAL | Status: DC
  Administered 2022-06-28 – 2022-06-30 (×3): 81 mg via ORAL
  Filled 2022-06-28 (×3): qty 1

## 2022-06-28 MED ORDER — LACTATED RINGERS IV SOLN: INTRAVENOUS | Status: DC

## 2022-06-28 MED ORDER — SODIUM CHLORIDE 0.9 % IV SOLN: 1.0000 g | INTRAVENOUS | Status: DC

## 2022-06-28 MED ORDER — SODIUM CHLORIDE 0.9 % IV SOLN: 100.0000 mg | Freq: Once | INTRAVENOUS | Status: DC

## 2022-06-28 MED ORDER — SODIUM CHLORIDE 0.9 % IV SOLN
100.0000 mg | Freq: Two times a day (BID) | INTRAVENOUS | Status: DC
  Administered 2022-06-28: 100 mg via INTRAVENOUS
  Filled 2022-06-28: qty 100

## 2022-06-28 MED ORDER — SODIUM CHLORIDE 0.9 % IV SOLN: 1.0000 g | Freq: Once | INTRAVENOUS | Status: DC

## 2022-06-28 MED ORDER — DILTIAZEM HCL-DEXTROSE 125-5 MG/125ML-% IV SOLN (PREMIX)
5.0000 mg/h | INTRAVENOUS | Status: DC
  Administered 2022-06-28: 5 mg/h via INTRAVENOUS
  Filled 2022-06-28 (×2): qty 125

## 2022-06-28 MED ORDER — SODIUM CHLORIDE 0.9 % IV BOLUS
1000.0000 mL | Freq: Once | INTRAVENOUS | Status: AC
  Administered 2022-06-28: 1000 mL via INTRAVENOUS

## 2022-06-28 MED ORDER — AZITHROMYCIN 250 MG PO TABS
500.0000 mg | ORAL_TABLET | Freq: Every day | ORAL | Status: DC
  Administered 2022-06-28: 500 mg via ORAL
  Filled 2022-06-28: qty 2

## 2022-06-28 MED ORDER — SENNOSIDES-DOCUSATE SODIUM 8.6-50 MG PO TABS: 1.0000 | ORAL_TABLET | Freq: Every evening | ORAL | Status: DC | PRN

## 2022-06-28 MED ORDER — PROPRANOLOL HCL 10 MG PO TABS
10.0000 mg | ORAL_TABLET | Freq: Two times a day (BID) | ORAL | Status: DC
  Administered 2022-06-28 – 2022-06-29 (×2): 10 mg via ORAL
  Filled 2022-06-28 (×3): qty 1

## 2022-06-28 MED ORDER — IPRATROPIUM-ALBUTEROL 0.5-2.5 (3) MG/3ML IN SOLN: 3.0000 mL | Freq: Four times a day (QID) | RESPIRATORY_TRACT | Status: DC | PRN

## 2022-06-28 NOTE — Hospital Course (Addendum)
Brandon Taylor is a 81 y.o. person living with a history of hyperlipidemia, hypertension, B12 deficiency who presented with cough and shortness of breath and admitted for COVID, community-acquired pneumonia with atrial fibrillation with RVR.  #COVID Pneumonia #Leukocytosis likely 2/2 to above  #Sepsis likely secondary to above #Elevated troponin Patient concerned with 1 week history of cough and shortness of breath. Patient tested on 06/22/2022.  There is a known sick contact with wife also having tested positive for COVID prior 2 weeks ago. Patient reports poor p.o intake and feeling fatigued.  On exam, patient has rhonchi throughout with congestion.  CXR showing possible right lower lobe pneumonia with some hyperexpansion.  White blood cell count 12.7. COVID positive.  Initial lactate 3.0.  Elevated troponin at 35 likely secondary to above.  There could be concern for acute coronary syndrome, but less likely given absence EKG changes and chest pain.  There could also be consideration for anemia, but unlikely due to normal hemoglobin.  There is also consideration for acute heart failure, but given volume status and negative BNP this is less likely. Given smoking history there could be concern for some underlying COPD but no PFTs noted. This is likely acute infection with COVID -Discontinue antibiotics, as less likely bacterial  -No need for steroids as patient is not hypoxic and  not wheezing  -Not a candidate for paxlovid as he is out of the window -Mucinex PRN -As needed DuoNebs   #New onset atrial fibrillation with RVR No history of Atrial fibrillation documented. Patient seemed to be in Afib initially but was not on RVR but eventually did go into RVR with rate into the 140s.  Patient is denying any dizziness on exam.  Patient does report lightheadedness.  No family history reported.  This could be new onset atrial fibrillation in setting of acute infection.  On my exam, patient has heart rates  going into the 140s with irregularly irregular rhythm noted. -Continue Cardizem drip -ECHO pending  -CHA2DS2-VASc score 3, consider anticoagulation if patient continues to remain in afib.  Could be transient given that patient has acute infection, will continue to monitor to see if patient remains in Afib after infection.   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  This could be due to poor p.o. intake, patient did receive 1.5 L of fluid in the emergency department.  On exam, patient is volume depleted with dry mucous membranes and decreased skin turgor.  Will repeat BMP in a.m. to see if fluids resolved this  -Start maintenance fluids at 100 mL/h of LR for 10 hours -Trend BMP   #Hyperlipidemia Patient has a history of hyperlipidemia.  Most recent lipid panel (12/03/2020) showing total cholesterol 201, LDL 105, HDL 83, triglycerides 72.  -Continue home pravastatin 20 mg daily and aspirin 81 mg daily   #Hypertension Patient's history of hypertension.  Patient's blood pressure here has been normal blood pressure around the 110s.  There has been some blood pressure is elevated in the 140s and 150s, but otherwise patient has been normotensive.  Patient is on home amlodipine 10 mg daily. -Hold home amlodipine 10 mg daily -Monitor blood pressure   #History of vitamin D deficiency -Vitamin D level pending   #GERD Patient is on home pantoprazole 40 mg daily.  No concerns for any abdominal pain or any reflux symptoms at this time. -Hold home pantoprazole 40 mg daily   #Prediabetes Most recent  A1c on 02/17/2017 5.8. -Repeat A1c pending   #Vitamin B12 Deficiency  Most recent vitamin B12 (05/02/2021) 776. -Continue home B12 supplement 1000 mcg daily

## 2022-06-28 NOTE — ED Notes (Signed)
ED Provider at bedside. 

## 2022-06-28 NOTE — H&P (Addendum)
Date: 06/28/2022               Patient Name:  Brandon Taylor MRN: [id number]  DOB: 1940/11/11 Age / Sex: 81 y.o., male   PCP: Ladell Pier, MD         Medical Service: Internal Medicine Teaching Service         Attending Physician: Dr. Lalla Brothers, MD     First Contact: Leigh Aurora, DO      Pager: [phone]      Second Contact: Linwood Dibbles, MD     Pager: [phone]        After Hours (After 5p/  First Contact Pager: [phone]  weekends / holidays): Second Contact Pager: [phone]   SUBJECTIVE   Chief Complaint: Shortness of breath and cough  History of Present Illness: This is a 81 year old male with a past medical history of Hypertension, GERD, hyperlipidemia, vitamin B12 deficiency presenting to the emergency room with concerns of a weeklong history of cough and shortness of breath.  Patient describes this cough to be a productive cough with yellow sputum that does not get better. He denies taking anything for this cough.  He also reports having loss of appetite as well as fatigue.  He notes that his wife was sick a week before he became sick, and she was also positive for COVID.  He notes that he took a COVID test 6 days ago on 06/22/2022 which was positive.  Since then, he reports that he has been short of breath, and coughing.  He reports that this shortness of breath is always there and does not change with laying down or sitting up or with exertion. He denies any chest pain, palpitations, abdominal pain, urinary symptoms, diarrhea, dizziness.  He does endorse some lightheadedness, when he stands up too fast.  He denies passing out or falling.  Patient does endorse that he has not eaten well in the past 6 days, but does report that he tries to drink a lot of water.  He states no other concerns at this time.  Patient does report from the onset of illness, he does feel better now than he did initially.  ED Course: Initially in the emergency room, patient's vitals  showed irregular heart rhythm with rate of 102.  Patient was tachypneic with respiratory rate of 26 and normotensive.  Patient satting at 93% on room air.  Chest x-ray showed possible right lower lung pneumonia.  White count elevated at 12.7.  EKG did show A-fib with RVR with rate of 140.  Patient was started on a Cardizem drip and started Rocephin and doxycycline for CAP coverage.  Meds:  No outpatient medications have been marked as taking for the 06/27/22 encounter [facility] Encounter).    Past Medical History  Past Surgical History:  Procedure Laterality Date   NO PAST SURGERIES     Social:  Lives With: Wife at home Occupation: Retired Support: Good support at home with wife Level of Function: Independent with all ADLs Substances: Patient reports smoking 10 cigarettes/day, 10 years ago for many years but is unable to tell me how many years.  Patient reports drinking a bottle of wine per day but quit 10 years ago as well, but is unable to tell me how many years he did this for.  Patient denies any drug use  Family History:  Mother: Hypertension  Allergies: Allergies as of 06/27/2022   (No Known Allergies)    Review of Systems: A complete ROS  was negative except as per HPI.  Constitutional: Patient reports fatigue and appetite loss, but denies any fever or chills Eye: Patient denies any vision loss Respiratory: Patient reports shortness of breath and cough Cardiovascular: Patient denies any chest pain, palpitations GI: Patient denies any abdominal pain, nausea, vomiting, diarrhea MSK: Patient denies any back pain GU: Patient denies any dysuria, hematuria, or urinary frequency Skin: Patient denies any rashes Neuro: Patient does endorse lightheadedness, but denies any dizziness, or focal neurological deficits   OBJECTIVE:   Physical Exam: Blood pressure 135/69, pulse (!) 25, temperature (!) 97.5 F (36.4 C), temperature source Oral, resp. rate (!) 27, SpO2 95 %.    Constitutional: Patient is resting in bed upon my exam.  Ill-appearing. HENT: normocephalic atraumatic, dry mucous membranes Cardiovascular: Irregularly irregular rhythm with tachycardic rate at 140.  No murmurs, rubs, or gallops heard.  No lower extremity edema appreciated, faint pulses noted to bilateral lower extremities Pulmonary/Chest: Lungs with rhonchi heard throughout. Abdominal: soft, non-tender, non-distended.  Normoactive bowel sounds MSK: Cachectic Neurological: Sensation intact throughout Skin: No rashes appreciated, there is decreased skin turgor appreciated  Labs: CBC    Component Value Date/Time   WBC 12.7 (H) 06/27/2022 1930   RBC 4.93 06/27/2022 1930   HGB 14.5 06/27/2022 1930   HGB 11.6 (L) 05/02/2021 1140   HCT 43.6 06/27/2022 1930   HCT 33.2 (L) 05/02/2021 1140   PLT 623 (H) 06/27/2022 1930   PLT 216 05/02/2021 1140   MCV 88.4 06/27/2022 1930   MCV 86 05/02/2021 1140   MCH 29.4 06/27/2022 1930   MCHC 33.3 06/27/2022 1930   RDW 15.2 06/27/2022 1930   RDW 12.0 05/02/2021 1140   LYMPHSABS 0.7 06/27/2022 1930   LYMPHSABS 1.2 05/02/2021 1140   MONOABS 0.9 06/27/2022 1930   EOSABS 0.0 06/27/2022 1930   EOSABS 0.0 05/02/2021 1140   BASOSABS 0.1 06/27/2022 1930   BASOSABS 0.0 05/02/2021 1140     CMP     Component Value Date/Time   NA 143 06/27/2022 1930   NA 142 10/10/2021 1149   K 4.6 06/27/2022 1930   CL 109 06/27/2022 1930   CO2 21 (L) 06/27/2022 1930   GLUCOSE 134 (H) 06/27/2022 1930   BUN 126 (H) 06/27/2022 1930   BUN 19 10/10/2021 1149   CREATININE 2.96 (H) 06/27/2022 1930   CREATININE 1.19 10/24/2014 1114   CALCIUM 8.9 06/27/2022 1930   PROT 8.0 06/27/2022 1930   PROT 7.3 02/27/2021 1052   ALBUMIN 2.7 (L) 06/27/2022 1930   ALBUMIN 4.6 02/27/2021 1052   AST 28 06/27/2022 1930   ALT 23 06/27/2022 1930   ALKPHOS 74 06/27/2022 1930   BILITOT 0.9 06/27/2022 1930   BILITOT 0.4 02/27/2021 1052   GFRNONAA 20 (L) 06/27/2022 1930   GFRNONAA 60  10/24/2014 1114   GFRAA 46 (L) 12/03/2020 1629   GFRAA 69 10/24/2014 1114    Imaging: Chest x-ray: Showing possible right lower lobe pneumonia with some hyperexpansion noted.  EKG: personally reviewed my interpretation is A-fib with RVR with rate of 140.  ASSESSMENT & PLAN:   Assessment & Plan by Problem: Principal Problem:   Atrial fibrillation with RVR (HCC)   Brandon Taylor is a 81 y.o. person living with a history of hyperlipidemia, hypertension, B12 deficiency who presented with cough and shortness of breath and admitted for COVID, community-acquired pneumonia with atrial fibrillation with RVR.  #COVID Pneumonia #Leukocytosis likely 2/2 to above  #Sepsis likely secondary to above #Elevated troponin Patient concerned  with 1 week history of cough and shortness of breath. Patient tested on 06/22/2022.  There is a known sick contact with wife also having tested positive for COVID prior 2 weeks ago. Patient reports poor p.o intake and feeling fatigued.  On exam, patient has rhonchi throughout with congestion.  CXR showing possible right lower lobe pneumonia with some hyperexpansion.  White blood cell count 12.7. COVID positive.  Initial lactate 3.0.  Elevated troponin at 35 likely secondary to above.  There could be concern for acute coronary syndrome, but less likely given absence EKG changes and chest pain.  There could also be consideration for anemia, but unlikely due to normal hemoglobin.  There is also consideration for acute heart failure, but given volume status and negative BNP this is less likely. Given smoking history there could be concern for some underlying COPD but no PFTs noted. This is likely acute infection with COVID -Discontinue antibiotics, as less likely bacterial  -No need for steroids as patient is not hypoxic and  not wheezing  -Not a candidate for paxlovid as he is out of the window -Mucinex PRN -As needed DuoNebs  #New onset atrial fibrillation with RVR No  history of Atrial fibrillation documented. Patient seemed to be in Afib initially but was not on RVR but eventually did go into RVR with rate into the 140s.  Patient is denying any dizziness on exam.  Patient does report lightheadedness.  No family history reported.  This could be new onset atrial fibrillation in setting of acute infection.  On my exam, patient has heart rates going into the 140s with irregularly irregular rhythm noted. -Continue Cardizem drip -ECHO pending  -CHA2DS2-VASc score 3, consider anticoagulation if patient continues to remain in afib.  Could be transient given that patient has acute infection, will continue to monitor to see if patient remains in Afib after infection.  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  This could be due to poor p.o. intake, patient did receive 1.5 L of fluid in the emergency department.  On exam, patient is volume depleted with dry mucous membranes and decreased skin turgor.  Will repeat BMP in a.m. to see if fluids resolved this  -Start maintenance fluids at 100 mL/h of LR for 10 hours -Trend BMP  #Hyperlipidemia Patient has a history of hyperlipidemia.  Most recent lipid panel (12/03/2020) showing total cholesterol 201, LDL 105, HDL 83, triglycerides 72.  -Continue home pravastatin 20 mg daily and aspirin 81 mg daily  #Hypertension Patient's history of hypertension.  Patient's blood pressure here has been normal blood pressure around the 110s.  There has been some blood pressure is elevated in the 140s and 150s, but otherwise patient has been normotensive.  Patient is on home amlodipine 10 mg daily. -Hold home amlodipine 10 mg daily -Monitor blood pressure  #History of vitamin D deficiency -Vitamin D level pending  #GERD Patient is on home pantoprazole 40 mg daily.  No concerns for any abdominal pain or any reflux symptoms at this time. -Hold home pantoprazole  40 mg daily  #Prediabetes Most recent A1c on 02/17/2017 5.8. -Repeat A1c pending  #Vitamin B12 Deficiency  Most recent vitamin B12 (05/02/2021) 776. -Continue home B12 supplement 1000 mcg daily  Diet: Normal VTE: Enoxaparin IVF: LR  100cc/hr for 10 hours  Code: Full  Prior to Admission Living Arrangement: Home, living with wife Anticipated Discharge Location: Home Barriers to Discharge: Clinical Improvement  Dispo: Admit patient to Inpatient with expected length of stay greater than 2 midnights.  Signed: Leigh Aurora, DO Internal Medicine Resident PGY-1 [phone] 06/28/2022, 9:44 AM

## 2022-06-28 NOTE — ED Provider Notes (Signed)
[facility] EMERGENCY DEPARTMENT Provider Note   CSN: [id number] Arrival date & time: 06/27/22  1628     History  Chief Complaint  Patient presents with   Covid Positive    Brandon Taylor is a 81 y.o. male.  The history is provided by the patient, the spouse and medical records.  Brandon Taylor is a 81 y.o. male who presents to the Emergency Department complaining of shortness of breath.  He presents to the emergency department accompanied by his wife for evaluation of shortness of breath.  He tested positive for COVID-19 at home about 1 week ago.  He has worsening cough and difficulty breathing.  No reports of fevers.  He is taking in fluids but is eating and drinking less than baseline.  No nausea, vomiting, chest pain, abdominal pain.  He has a history of stage III CKD, hypertension.  Patient denies any alcohol use.  Symptoms are severe, constant, worsening.     Home Medications Prior to Admission medications   Medication Sig Start Date End Date Taking? Authorizing Provider  amLODipine (NORVASC) 10 MG tablet TAKE 1 TABLET BY MOUTH EVERY DAY 05/27/22   Ladell Pier, MD  ASPIRIN ADULT LOW STRENGTH 81 MG EC tablet TAKE 1 TABLET BY MOUTH EVERY DAY Patient taking differently: Take 81 mg by mouth daily. 04/19/19   Ladell Pier, MD  [employer] VITAMIN B12 1000 MCG tablet TAKE 1 TABLET BY MOUTH EVERY DAY 03/05/22   Ladell Pier, MD  pantoprazole (PROTONIX) 40 MG tablet TAKE 1 TABLET BY MOUTH EVERY DAY 05/27/22   Ladell Pier, MD  pravastatin (PRAVACHOL) 20 MG tablet Take 1 tablet (20 mg total) by mouth daily. 06/27/22   Ladell Pier, MD      Allergies    Patient has no known allergies.    Review of Systems   Review of Systems  All other systems reviewed and are negative.   Physical Exam Updated Vital Signs BP (!) 140/108   Pulse (!) 31   Temp (!) 97.5 F (36.4 C) (Oral)   Resp (!) 27   SpO2 94%  Physical Exam Vitals and nursing note  reviewed.  Constitutional:      General: He is in acute distress.     Appearance: He is well-developed. He is ill-appearing.  HENT:     Head: Normocephalic and atraumatic.  Cardiovascular:     Rate and Rhythm: Tachycardia present. Rhythm irregular.     Heart sounds: No murmur heard. Pulmonary:     Comments: Tachypnea, diffuse rhonchi Abdominal:     Palpations: Abdomen is soft.     Tenderness: There is no abdominal tenderness. There is no guarding or rebound.  Musculoskeletal:        General: No swelling or tenderness.  Skin:    General: Skin is warm and dry.  Neurological:     Mental Status: He is alert and oriented to person, place, and time.  Psychiatric:        Behavior: Behavior normal.     ED Results / Procedures / Treatments   Labs (all labs ordered are listed, but only abnormal results are displayed) Labs Reviewed  CBC WITH DIFFERENTIAL/PLATELET - Abnormal; Notable for the following components:      Result Value   WBC 12.7 (*)    Platelets 623 (*)    Neutro Abs 10.8 (*)    Abs Immature Granulocytes 0.35 (*)    All other components within normal limits  COMPREHENSIVE METABOLIC PANEL - Abnormal; Notable for the following components:   CO2 21 (*)    Glucose, Bld 134 (*)    BUN 126 (*)    Creatinine, Ser 2.96 (*)    Albumin 2.7 (*)    GFR, Estimated 20 (*)    All other components within normal limits  URINALYSIS, ROUTINE W REFLEX MICROSCOPIC - Abnormal; Notable for the following components:   APPearance HAZY (*)    Hgb urine dipstick SMALL (*)    Ketones, ur 5 (*)    Protein, ur 30 (*)    Bacteria, UA RARE (*)    All other components within normal limits  TROPONIN I (HIGH SENSITIVITY) - Abnormal; Notable for the following components:   Troponin I (High Sensitivity) 35 (*)    All other components within normal limits  RESP PANEL BY RT-PCR (FLU A&B, COVID) ARPGX2  CULTURE, BLOOD (ROUTINE X 2)  CULTURE, BLOOD (ROUTINE X 2)  BRAIN NATRIURETIC PEPTIDE  LACTIC  ACID, PLASMA  LACTIC ACID, PLASMA  CBG MONITORING, ED  TROPONIN I (HIGH SENSITIVITY)    EKG EKG Interpretation  Date/Time:  Saturday June 28 2022 06:23:05 EDT Ventricular Rate:  146 PR Interval:    QRS Duration: 79 QT Interval:  330 QTC Calculation: 524 R Axis:   2 Text Interpretation: Atrial fibrillation with rapid V-rate Anterior infarct, old Confirmed by Quintella Reichert [phone]) on 06/28/2022 7:04:10 AM  Radiology DG Chest Portable 1 View  Result Date: 06/28/2022 CLINICAL DATA:  COVID positive.  Cough. EXAM: PORTABLE CHEST 1 VIEW COMPARISON:  09/23/2013 FINDINGS: Lungs are hyperexpanded. Infrahilar airspace disease noted right lung base. Left lung clear. Pleural irregularity/scarring at the left base is stable since the remote study. The cardiopericardial silhouette is within normal limits for size. The visualized bony structures of the thorax are unremarkable. Telemetry leads overlie the chest. IMPRESSION: Infrahilar airspace opacity at the right lung base, suspicious for pneumonia. Electronically Signed   By: Misty Stanley M.D.   On: 06/28/2022 07:08    Procedures Procedures   CRITICAL CARE Performed by: Quintella Reichert   Total critical care time: 45 minutes  Critical care time was exclusive of separately billable procedures and treating other patients.  Critical care was necessary to treat or prevent imminent or life-threatening deterioration.  Critical care was time spent personally by me on the following activities: development of treatment plan with patient and/or surrogate as well as nursing, discussions with consultants, evaluation of patient's response to treatment, examination of patient, obtaining history from patient or surrogate, ordering and performing treatments and interventions, ordering and review of laboratory studies, ordering and review of radiographic studies, pulse oximetry and re-evaluation of patient's condition.  Medications Ordered in  ED Medications  diltiazem (CARDIZEM) 125 mg in dextrose 5% 125 mL (1 mg/mL) infusion (7.5 mg/hr Intravenous Rate/Dose Change 06/28/22 0752)  doxycycline (VIBRAMYCIN) 100 mg in sodium chloride 0.9 % 250 mL IVPB (100 mg Intravenous New Bag/Given 06/28/22 0738)  sodium chloride 0.9 % bolus 500 mL (500 mLs Intravenous Bolus 06/28/22 0736)  cefTRIAXone (ROCEPHIN) 1 g in sodium chloride 0.9 % 100 mL IVPB (1 g Intravenous New Bag/Given 06/28/22 0738)  sodium chloride 0.9 % bolus 1,000 mL (1,000 mLs Intravenous Bolus 06/28/22 0736)    ED Course/ Medical Decision Making/ A&P                           Medical Decision Making Amount and/or Complexity of Data Reviewed Labs:  ordered.  Risk Prescription drug management.   Patient with history of stage III CKD here for evaluation of worsening shortness of breath, fatigue.  He did test positive for COVID-19 on a home test 1 week ago.  His wife was also sick and they were isolating from each other.  He is ill-appearing on evaluation with tachypnea, rhonchi, tachycardia.  EKG demonstrates new onset A-fib with RVR.  Chest x-ray with right lower lobe infiltrate.  He was started on IV fluids as patient appears very dehydrated.  We will start Cardizem drip without bolus due to his A-fib with RVR.  Feel fluids will be more beneficial for him at this juncture.  He was started on antibiotics for right lower lobe pneumonia.  Labs significant for acute renal failure with BUN up to 120s.  Discussed with patient and wife critical nature of illness and recommendation for admission for ongoing care and they are in agreement with plan.  Medicine consulted for admission.        Final Clinical Impression(s) / ED Diagnoses Final diagnoses:  None    Rx / DC Orders ED Discharge Orders     None         Quintella Reichert, MD 06/28/22 [phone]

## 2022-06-28 NOTE — ED Notes (Signed)
This tech responded to lobby bathroom to lobby tech requesting assistance. Pt sitting on toilet with unlabored breathing. Pt not responding to verbal or painful stimuli, then moved to recliner where pt began talking again.

## 2022-06-29 ENCOUNTER — Inpatient Hospital Stay (HOSPITAL_COMMUNITY): Payer: Medicare Other

## 2022-06-29 DIAGNOSIS — A419 Sepsis, unspecified organism: Secondary | ICD-10-CM | POA: Diagnosis not present

## 2022-06-29 DIAGNOSIS — J1282 Pneumonia due to coronavirus disease 2019: Secondary | ICD-10-CM | POA: Diagnosis not present

## 2022-06-29 DIAGNOSIS — I4891 Unspecified atrial fibrillation: Secondary | ICD-10-CM | POA: Diagnosis not present

## 2022-06-29 DIAGNOSIS — D72829 Elevated white blood cell count, unspecified: Secondary | ICD-10-CM | POA: Diagnosis not present

## 2022-06-29 LAB — BASIC METABOLIC PANEL
Anion gap: 8 (ref 5–15)
BUN: 78 mg/dL — ABNORMAL HIGH (ref 8–23)
CO2: 22 mmol/L (ref 22–32)
Calcium: 8.2 mg/dL — ABNORMAL LOW (ref 8.9–10.3)
Chloride: 115 mmol/L — ABNORMAL HIGH (ref 98–111)
Creatinine, Ser: 2.14 mg/dL — ABNORMAL HIGH (ref 0.61–1.24)
GFR, Estimated: 30 mL/min — ABNORMAL LOW (ref >60)
Glucose, Bld: 120 mg/dL — ABNORMAL HIGH (ref 70–99)
Potassium: 4.4 mmol/L (ref 3.5–5.1)
Sodium: 145 mmol/L (ref 135–145)

## 2022-06-29 LAB — CBC WITH DIFFERENTIAL/PLATELET
Abs Immature Granulocytes: 0.41 10*3/uL — ABNORMAL HIGH (ref 0.00–0.07)
Basophils Absolute: 0 10*3/uL (ref 0.0–0.1)
Basophils Relative: 0 %
Eosinophils Absolute: 0 10*3/uL (ref 0.0–0.5)
Eosinophils Relative: 0 %
HCT: 37.5 % — ABNORMAL LOW (ref 39.0–52.0)
Hemoglobin: 12.4 g/dL — ABNORMAL LOW (ref 13.0–17.0)
Immature Granulocytes: 4 %
Lymphocytes Relative: 8 %
Lymphs Abs: 0.8 10*3/uL (ref 0.7–4.0)
MCH: 29.3 pg (ref 26.0–34.0)
MCHC: 33.1 g/dL (ref 30.0–36.0)
MCV: 88.7 fL (ref 80.0–100.0)
Monocytes Absolute: 0.7 10*3/uL (ref 0.1–1.0)
Monocytes Relative: 6 %
Neutro Abs: 9.3 10*3/uL — ABNORMAL HIGH (ref 1.7–7.7)
Neutrophils Relative %: 82 %
Platelets: 559 10*3/uL — ABNORMAL HIGH (ref 150–400)
RBC: 4.23 MIL/uL (ref 4.22–5.81)
RDW: 15.3 % (ref 11.5–15.5)
WBC: 11.3 10*3/uL — ABNORMAL HIGH (ref 4.0–10.5)
nRBC: 0 % (ref 0.0–0.2)

## 2022-06-29 LAB — ECHOCARDIOGRAM COMPLETE
Height: 72 in
S' Lateral: 3.2 cm
Weight: 1777.79 oz

## 2022-06-29 MED ORDER — METOPROLOL TARTRATE 25 MG PO TABS
25.0000 mg | ORAL_TABLET | Freq: Two times a day (BID) | ORAL | Status: DC
  Administered 2022-06-29 – 2022-06-30 (×3): 25 mg via ORAL
  Filled 2022-06-29 (×3): qty 1

## 2022-06-29 MED ORDER — VITAMIN D 25 MCG (1000 UNIT) PO TABS
1000.0000 [IU] | ORAL_TABLET | Freq: Every day | ORAL | Status: DC
  Administered 2022-06-29 – 2022-06-30 (×2): 1000 [IU] via ORAL
  Filled 2022-06-29 (×2): qty 1

## 2022-06-29 NOTE — Progress Notes (Signed)
  Echocardiogram 2D Echocardiogram has been performed.  Merrie Roof F 06/29/2022, 10:21 AM

## 2022-06-29 NOTE — Progress Notes (Addendum)
HD#1 Subjective:   Summary: Brandon Taylor is a 81 y.o. person living with a history of hyperlipidemia, hypertension, B12 deficiency who presented with cough and shortness of breath and admitted for COVID, community-acquired pneumonia with atrial fibrillation with RVR.   Overnight Events: No overnight events   Interval history: Patient reports that he is doing well He notes that he slept well and his cough is better. He denies any shortness of breath, chest pain, palpitations, dizziness or lightheadedness. Patient states that he is still having this cough with production but it is much less than it was yesterday.   Objective:  Vital signs in last 24 hours: Vitals:   06/29/22 0400 06/29/22 0530 06/29/22 0600 06/29/22 0628  BP: 111/62  104/60 120/67  Pulse: 78  66   Resp: 18  19   Temp: 97.7 F (36.5 C)     TempSrc: Oral     SpO2: 97%  95%   Weight:  50.4 kg    Height:       Supplemental O2: Room Air SpO2: 95 %   Physical Exam:  Constitutional: well appearing, in no acute distress and is not on O2.  HENT: normocephalic atraumatic Neck: Supple, no thyroid nodules felt, no thyroid enlargement appreciated  Cardiovascular: distant heart sounds, but regular rate and rhythm  Pulmonary/Chest: normal work of breathing on room air, some rhonchi heard to left lung but right side clear to auscultation  MSK: cachectic  Ext: No lower extremity edema appreciated  Filed Weights   06/28/22 1443 06/29/22 0530  Weight: 49.9 kg 50.4 kg     Intake/Output Summary (Last 24 hours) at 06/29/2022 0912 Last data filed at 06/29/2022 0500 Gross per 24 hour  Intake 1600 ml  Output 400 ml  Net 1200 ml   Net IO Since Admission: 1,200 mL [06/29/22 0912]  Pertinent Labs:    Latest Ref Rng & Units 06/29/2022    2:16 AM 06/27/2022    7:30 PM 05/02/2021   11:40 AM  CBC  WBC 4.0 - 10.5 K/uL 11.3  12.7  4.5   Hemoglobin 13.0 - 17.0 g/dL 12.4  14.5  11.6   Hematocrit 39.0 - 52.0 % 37.5  43.6  33.2    Platelets 150 - 400 K/uL 559  623  216        Latest Ref Rng & Units 06/29/2022    2:16 AM 06/27/2022    7:30 PM 10/10/2021   11:49 AM  CMP  Glucose 70 - 99 mg/dL 120  134  77   BUN 8 - 23 mg/dL 78  126  19   Creatinine 0.61 - 1.24 mg/dL 2.14  2.96  1.44   Sodium 135 - 145 mmol/L 145  143  142   Potassium 3.5 - 5.1 mmol/L 4.4  4.6  4.3   Chloride 98 - 111 mmol/L 115  109  104   CO2 22 - 32 mmol/L 22  21  26    Calcium 8.9 - 10.3 mg/dL 8.2  8.9  9.1   Total Protein 6.5 - 8.1 g/dL  8.0    Total Bilirubin 0.3 - 1.2 mg/dL  0.9    Alkaline Phos 38 - 126 U/L  74    AST 15 - 41 U/L  28    ALT 0 - 44 U/L  23      Imaging: No results found.  Assessment/Plan:   Principal Problem:   Atrial fibrillation with RVR (HCC) Active Problems:   Mild  dementia ([place])   CKD (chronic kidney disease), stage III ([place])   Pneumonia due to COVID-19 virus   Patient Summary: Brandon Taylor is a 81 y.o. person living with a history of hyperlipidemia, hypertension, B12 deficiency who presented with cough and shortness of breath and admitted for COVID, community-acquired pneumonia with atrial fibrillation with RVR.    #COVID Pneumonia #Leukocytosis likely 2/2 to above, resolving  #Elevated troponin, resolved  White count is trending down to 11.3 today.  Patient reports that his cough is getting better.  Patient did require oxygen yesterday, but is no longer requiring oxygen today.  Patient is improving.  Blood pressures remaining stable.  On exam, patient still has some ronchi on auscultation, but improved from yesterday. Continue supportive care.  -Mucinex PRN -As needed DuoNebs -Trend white count  -Trend Fever   #Paroxysmal atrial fibrillation #First degree AV block  Overnight patient seemed to convert to sinus rhythm and remained in sinus rhythm. On tele review patient does seem to have developed a first degree AV block with prolong PR interval. Patient has no symptoms. On exam there are distant  heart sounds but regular rate and rhythm. There was 1 episode of tachycardia overnight, otherwise has been pretty stable.  Plan will be to transition from Cardizem to metoprolol tartrate. -Transition from Cardizem to metoprolol tartrate 25 mg twice daily -ECHO pending  -CHA2DS2-VASc score 3 -We will consider anticoagulation, but patient does seem to be in sinus rhythm at this time -plan for monitor outpatient to see if patient stays in sinus  -repeat EKG pending    #Clinical hyperthyroidism TSH 0.328.  Free T4 1.84.  T3 and thyroid peroxidase antibodies pending.  After speaking to patient's wife, he does not take biotin. This  new hyperthyroidism could be contributing to new onset atrial fibrillation. -Start methimazole 10 mg twice daily -Start metoprolol 25mg  bid -T3 and TRAb pending -Consider radioactive iodine uptake study outpatient if antibodies negative  -Continue to monitor for symptoms of thyrotoxicosis  #Acute on chronic kidney disease 3B, resolving Patient's creatinine down to 2.14 today after receiving maintenance fluids yesterday.  This was likely a prerenal AKI.  Patient's baseline is around 1.4-1.8.  We will encourage patient to increase p.o. intake.  -Trend BMP  #Hyperlipidemia Patient has a history of hyperlipidemia.  Most recent lipid panel (12/03/2020) showing total cholesterol 201, LDL 105, HDL 83, triglycerides 72.  -Continue home pravastatin 20 mg daily and aspirin 81 mg daily   #Hypertension Patient's history of hypertension.  Patient's blood pressure here has been normal blood pressure around the 110s.  There was a hypotensive episode yesterday, and this could have been due to the Cardizem drip, patient was transitioned from Cardizem drip to oral Cardizem.  Plan is to remove Cardizem today and switch to metoprolol tartrate. -Hold home amlodipine 10 mg daily -Monitor blood pressure   #Vitamin D deficiency -Vitamin D level decreased at 17 -Start supplementation  with vitamin D 1000 IU daily   #GERD Patient is on home pantoprazole 40 mg daily.  No concerns for any abdominal pain or any reflux symptoms at this time. -Hold home pantoprazole 40 mg daily   #Prediabetes Most recent A1c on 02/17/2017 5.8. -Repeat A1c  6.1   #Vitamin B12 Deficiency  Most recent vitamin B12 (05/02/2021) 776. -Continue home B12 supplement 1000 mcg daily  Diet: Normal IVF: None  VTE: Enoxaparin Code: Full   Dispo: Anticipated discharge to Home in 1 days pending Clinical improvement.   Leigh Aurora DO  Internal Medicine Resident PGY-1 [phone] Please contact the on call pager after 5 pm and on weekends at [phone].

## 2022-06-30 ENCOUNTER — Other Ambulatory Visit (HOSPITAL_COMMUNITY): Payer: Self-pay

## 2022-06-30 ENCOUNTER — Inpatient Hospital Stay: Payer: Medicare Other

## 2022-06-30 ENCOUNTER — Other Ambulatory Visit: Payer: Self-pay | Admitting: Home Health

## 2022-06-30 DIAGNOSIS — I4891 Unspecified atrial fibrillation: Secondary | ICD-10-CM

## 2022-06-30 DIAGNOSIS — I480 Paroxysmal atrial fibrillation: Secondary | ICD-10-CM | POA: Diagnosis not present

## 2022-06-30 DIAGNOSIS — Z87891 Personal history of nicotine dependence: Secondary | ICD-10-CM | POA: Diagnosis not present

## 2022-06-30 LAB — CBC WITH DIFFERENTIAL/PLATELET
Abs Immature Granulocytes: 0.19 10*3/uL — ABNORMAL HIGH (ref 0.00–0.07)
Basophils Absolute: 0 10*3/uL (ref 0.0–0.1)
Basophils Relative: 0 %
Eosinophils Absolute: 0 10*3/uL (ref 0.0–0.5)
Eosinophils Relative: 0 %
HCT: 35.1 % — ABNORMAL LOW (ref 39.0–52.0)
Hemoglobin: 11.5 g/dL — ABNORMAL LOW (ref 13.0–17.0)
Immature Granulocytes: 2 %
Lymphocytes Relative: 9 %
Lymphs Abs: 0.9 10*3/uL (ref 0.7–4.0)
MCH: 29.2 pg (ref 26.0–34.0)
MCHC: 32.8 g/dL (ref 30.0–36.0)
MCV: 89.1 fL (ref 80.0–100.0)
Monocytes Absolute: 0.8 10*3/uL (ref 0.1–1.0)
Monocytes Relative: 8 %
Neutro Abs: 7.9 10*3/uL — ABNORMAL HIGH (ref 1.7–7.7)
Neutrophils Relative %: 81 %
Platelets: 530 10*3/uL — ABNORMAL HIGH (ref 150–400)
RBC: 3.94 MIL/uL — ABNORMAL LOW (ref 4.22–5.81)
RDW: 15.3 % (ref 11.5–15.5)
WBC: 9.8 10*3/uL (ref 4.0–10.5)
nRBC: 0 % (ref 0.0–0.2)

## 2022-06-30 LAB — BASIC METABOLIC PANEL
Anion gap: 6 (ref 5–15)
BUN: 43 mg/dL — ABNORMAL HIGH (ref 8–23)
CO2: 22 mmol/L (ref 22–32)
Calcium: 8.2 mg/dL — ABNORMAL LOW (ref 8.9–10.3)
Chloride: 118 mmol/L — ABNORMAL HIGH (ref 98–111)
Creatinine, Ser: 1.97 mg/dL — ABNORMAL HIGH (ref 0.61–1.24)
GFR, Estimated: 33 mL/min — ABNORMAL LOW (ref >60)
Glucose, Bld: 148 mg/dL — ABNORMAL HIGH (ref 70–99)
Potassium: 4.4 mmol/L (ref 3.5–5.1)
Sodium: 146 mmol/L — ABNORMAL HIGH (ref 135–145)

## 2022-06-30 LAB — THYROID STIMULATING IMMUNOGLOBULIN: Thyroid Stimulating Immunoglob: <0.1 IU/L (ref 0.00–0.55)

## 2022-06-30 LAB — THYROID PEROXIDASE ANTIBODY: Thyroperoxidase Ab SerPl-aCnc: 19 IU/mL (ref 0–34)

## 2022-06-30 LAB — T3: T3, Total: 65 ng/dL — ABNORMAL LOW (ref 71–180)

## 2022-06-30 MED ORDER — APIXABAN 2.5 MG PO TABS
2.5000 mg | ORAL_TABLET | Freq: Two times a day (BID) | ORAL | 5 refills | Status: DC
  Filled 2022-06-30: qty 60, 30d supply, fill #0

## 2022-06-30 MED ORDER — METHIMAZOLE 10 MG PO TABS
10.0000 mg | ORAL_TABLET | Freq: Two times a day (BID) | ORAL | 2 refills | Status: DC
  Filled 2022-06-30: qty 60, 30d supply, fill #0

## 2022-06-30 MED ORDER — VITAMIN D3 25 MCG PO TABS
1000.0000 [IU] | ORAL_TABLET | Freq: Every day | ORAL | 1 refills | Status: DC
  Filled 2022-06-30: qty 90, 90d supply, fill #0

## 2022-06-30 MED ORDER — GUAIFENESIN ER 600 MG PO TB12
600.0000 mg | ORAL_TABLET | Freq: Two times a day (BID) | ORAL | 0 refills | Status: DC | PRN
  Filled 2022-06-30: qty 30, 15d supply, fill #0

## 2022-06-30 MED ORDER — METOPROLOL TARTRATE 25 MG PO TABS
25.0000 mg | ORAL_TABLET | Freq: Two times a day (BID) | ORAL | 5 refills | Status: DC
  Filled 2022-06-30: qty 60, 30d supply, fill #0

## 2022-06-30 NOTE — Progress Notes (Signed)
Patients wife left with patient without receiving AVS and going over discharge paperwork.  I called the patients wife and asked why she left without discharge instructions and she stated "someone told me I could leave".  I reviewed discharge instructions via telephone and instructed her to call with any other questions or concerns.  Patients wife stated that they did receive discharge medications and instructions from the pharmacist via telephone.  Wife states she has no further questions.

## 2022-06-30 NOTE — TOC Progression Note (Addendum)
Transition of Care [facility]) - Progression Note    Patient Details  Name: Brandon Taylor MRN: [id number] Date of Birth: 22-Aug-1941  Transition of Care [facility]) CM/SW Contact  Zenon Mayo, RN Phone Number: 06/30/2022, 8:51 AM  Clinical Narrative:    From home with spouse, admitted for COVID, community-acquired pneumonia with atrial fibrillation with RVR.  awaiting pt/ot eval.  TOC following.          Expected Discharge Plan and Services                                                 Social Determinants of Health (SDOH) Interventions    Readmission Risk Interventions     No data to display

## 2022-06-30 NOTE — Discharge Instructions (Addendum)
[name]  It was a pleasure taking care of you at [place] were admitted for COVID-pneumonia as well as atrial fibrillation with RVR.  We are discharging you home now that you are doing better. Please follow the following instructions.   1) Regarding your COVID-pneumonia, you have responded well to supportive care.  Your symptoms have resolved.  Please continue to monitor for fevers and chills at home.  2) Regarding your new atrial fibrillation, you have converted back to a normal rhythm during hospitalization.  We will send you home on a heart monitor to evaluate to see if you go back into the rhythm.  You have a scheduled appointment with Dr. Sallyanne Kuster on 07/22/2022 at 10 AM.  The address is:  [address], [place], [place] [address].  Please show up.  You will also be discharged on metoprolol tartrate 25 mg twice daily to help with rate control.  You will also be discharged on Eliquis 2.5 mg twice daily which is your blood thinner.  Please take these medications as prescribed.  As you are taking a blood thinner, stop taking aspirin.  3) Regarding your hyperthyroidism (overactive thyroid gland), we have discharged you on methimazole 10 mg twice daily.  Please take this medication as prescribed.  Please follow-up with your outpatient PCP (Dr. Wynetta Emery) on 07/28/2022 at 2:30pm to further address this.   4) If you develop any fever, chills, worsening shortness of breath please return back to the hospital.  Take care,  Dr. Leigh Aurora, DO

## 2022-06-30 NOTE — Discharge Summary (Addendum)
Name: Brandon Taylor MRN: [id number] DOB: 1941-05-18 81 y.o. PCP: Ladell Pier, MD  Date of Admission: 06/27/2022  6:12 PM Date of Discharge: 06/30/2022 Attending Physician: Rennis Harding   Discharge Diagnosis: Principal Problem:   Atrial fibrillation with RVR ([place]) Active Problems:   Mild dementia ([place])   CKD (chronic kidney disease), stage III ([place])   Pneumonia due to COVID-19 virus    Discharge Medications: Allergies as of 06/30/2022   No Known Allergies      Medication List     STOP taking these medications    amLODipine 10 MG tablet Commonly known as: NORVASC   Aspirin Adult Low Strength 81 MG tablet Generic drug: aspirin EC       TAKE these medications    [employer] VITAMIN B12 1000 MCG tablet Generic drug: cyanocobalamin TAKE 1 TABLET BY MOUTH EVERY DAY What changed: how much to take   Eliquis 2.5 MG Tabs tablet Generic drug: apixaban Take 1 tablet (2.5 mg total) by mouth 2 (two) times daily.   latanoprost 0.005 % ophthalmic solution Commonly known as: XALATAN Place 1 drop into both eyes at bedtime as needed.   methimazole 10 MG tablet Commonly known as: TAPAZOLE Take 1 tablet (10 mg total) by mouth 2 (two) times daily.   metoprolol tartrate 25 MG tablet Commonly known as: LOPRESSOR Take 1 tablet (25 mg total) by mouth 2 (two) times daily.   pantoprazole 40 MG tablet Commonly known as: PROTONIX TAKE 1 TABLET BY MOUTH EVERY DAY   pravastatin 20 MG tablet Commonly known as: PRAVACHOL Take 1 tablet (20 mg total) by mouth daily.   SM Mucus Relief 600 MG 12 hr tablet Generic drug: guaiFENesin Take 1 tablet (600 mg total) by mouth 2 (two) times daily as needed for cough or to loosen phlegm.   vitamin D3 25 MCG tablet Commonly known as: CHOLECALCIFEROL Take 1 tablet (1,000 Units total) by mouth daily. Start taking on: July 01, 2022        Disposition and follow-up:   Mr.Brandon Taylor was discharged from [facility]  in Stable condition.  At the hospital follow up visit please address:  1.  Follow-up:  a.  COVID-pneumonia: Patient was treated with supportive care.  He did not meet the window for Paxlovid.  Steroids were not initiated due to good oxygenation. Patient recovered well.  Continue to monitor respiratory status outpatient.    b.  Paroxysmal atrial fibrillation: Patient did go into atrial fibrillation with RVR during hospitalization.  Patient was rate controlled initially with Cardizem, and transitioned to metoprolol titrate.  Patient did spontaneously convert to normal sinus rhythm during hospitalization.  TTE with low normal LV function (50-55%), mild-moderate mitral valve regurgitation, and normal LA size. Patient discharged on tmetoprolol tartrate and anticoagulated with Eliquis 2.5 mg twice daily.  Patient also discharged on heart monitor.  Follow-up outpatient with heart monitor reading, cardiology appointment scheduled 10/10.    c.  Hyperthyroidism: Patient found to have hyperthyroidism during hospitalization.  Patient was started on methimazole 10 mg twice daily.  Please follow-up thyroid-stimulating antibodies outpatient.  If antibodies are negative, consider radioactive iodine uptake study.  2.  Labs / imaging needed at time of follow-up: CBC  3.  Pending labs/ test needing follow-up: Thyroid Stimulating antibodies  4.  Medication Changes  1) Stop aspirin 81 mg daily  2) Stop amlodipine 10 mg daily  3) Start metoprolol tartrate 25 mg twice daily  4) Start methimazole 10 mg  twice daily  5) Start Eliquis 2.5 mg twice daily  Follow-up Appointments:  Follow-up Information     Ladell Pier, MD. Go on 07/28/2022.   Specialty: Internal Medicine Why: @2 :30pm Contact information: [place] [address] [phone]         Sanda Klein, MD. Go on 07/22/2022.   Specialty: Cardiology Why: Please go to your appointment on July 22, 2022 at 10  AM. Contact information: [address] [place] [address] [phone]         [facility] [place] Follow up.   Specialty: Home Health Services Why: Agency will call you to set up apt times Contact information: [place] [address] [phone]                 Hospital Course by problem list: Brandon Taylor is a 81 y.o. person living with a history of hyperlipidemia, hypertension, B12 deficiency who presented with cough and shortness of breath and admitted for COVID, community-acquired pneumonia with atrial fibrillation with RVR with new diagnosis of hyperthyroidism.   #COVID Pneumonia #Leukocytosis likely 2/2 to above, resolved #Sepsis likely secondary to above, resolved #Elevated troponin, resolved Patient initially admitted for cough and shortness of breath and diagnosed with COVID-pneumonia.  Concern for sepsis, blood cultures were taken which were negative.  Initial white count was elevated at 12, and resolved during hospitalization.  Symptoms resolved during hospitalization.  Troponins were elevated, but resolved as well.  There is no concern for ACS during hospitalization.  Patient did not require antibiotics in the hospital.  Supportive care was given.    #Paroxysmal atrial fibrillation Patient did go into atrial fibrillation with RVR on admission.  Patient was rate controlled during hospitalization with Cardizem, and transition to metoprolol.  Patient did spontaneously convert back to sinus rhythm.  Patient will be anticoagulated with Eliquis 2.5 mg twice daily and rate controlled with metoprolol tartrate 25 mg twice daily.  Patient be discharged on heart monitor and follow-up with cardiology for further evaluation.    #Hyperthyroidism Patient was diagnosed with hyperthyroidism during admission.  Patient started on methimazole 10 mg twice daily.  Thyroid-stimulating antibodies still pending.  Patient to follow-up outpatient  for further work-up.  #Acute on chronic kidney disease 3B  Patient's kidney function initially was down, and returned back to baseline around 1.9 during hospitalization.  This was likely due to poor p.o. intake.  Continue to monitor kidney function outpatient.    #Hyperlipidemia  Patient was continued on continue home pravastatin 20 mg daily.  Aspirin was discontinued due to start of Eliquis.   #Hypertension Patient's home amlodipine was held due to some low blood pressures during hospitalization.  Patient continued to have normotensive blood pressures during hospitalization.  Patient to remain off amlodipine 10 mg daily.  Monitor outpatient.   #History of vitamin D deficiency Patient started on vitamin D supplement 1000 IU daily.   #GERD Patient's pantoprazole 40 mg was held during hospitalization.  Resume pantoprazole 40 mg at home.   #Prediabetes Patient's most recent A1c is 6.1 during this hospital.  Monitor outpatient.   #Vitamin B12 Deficiency  Patient was continued on home B12 supplement in hospital    Discharge Subjective: Patient is resting bed with no concerns at this point.  He states he is ready to go home.  He states that his cough is getting better, but he still brings up some sputum here and there.  He  states he is feeling much better.  He denies any shortness of breath, chest pain, lightheadedness, or dizziness.  He denies any palpitations.  Discharge Exam:   BP 93/62   Pulse 79   Temp (!) 97.4 F (36.3 C) (Oral)   Resp 20   Ht 6' (1.829 m)   Wt 53.4 kg   SpO2 98%   BMI 15.97 kg/m  Constitutional: Well-appearing, sitting in bed in no acute distress. HENT: normocephalic atraumatic Neck: No thyroid nodules or enlargement found. Cardiovascular: regular rate and rhythm, no m/r/g Pulmonary/Chest: normal work of breathing on room air, lungs clear to auscultation bilaterally MSK: normal bulk and tone  Pertinent Labs, Studies, and Procedures:     Latest Ref Rng  & Units 06/30/2022    9:26 AM 06/29/2022    2:16 AM 06/27/2022    7:30 PM  CBC  WBC 4.0 - 10.5 K/uL 9.8  11.3  12.7   Hemoglobin 13.0 - 17.0 g/dL 11.5  12.4  14.5   Hematocrit 39.0 - 52.0 % 35.1  37.5  43.6   Platelets 150 - 400 K/uL 530  559  623        Latest Ref Rng & Units 06/30/2022    9:26 AM 06/29/2022    2:16 AM 06/27/2022    7:30 PM  CMP  Glucose 70 - 99 mg/dL 148  120  134   BUN 8 - 23 mg/dL 43  78  126   Creatinine 0.61 - 1.24 mg/dL 1.97  2.14  2.96   Sodium 135 - 145 mmol/L 146  145  143   Potassium 3.5 - 5.1 mmol/L 4.4  4.4  4.6   Chloride 98 - 111 mmol/L 118  115  109   CO2 22 - 32 mmol/L 22  22  21    Calcium 8.9 - 10.3 mg/dL 8.2  8.2  8.9   Total Protein 6.5 - 8.1 g/dL   8.0   Total Bilirubin 0.3 - 1.2 mg/dL   0.9   Alkaline Phos 38 - 126 U/L   74   AST 15 - 41 U/L   28   ALT 0 - 44 U/L   23     DG Chest Portable 1 View  Result Date: 06/28/2022 CLINICAL DATA:  COVID positive.  Cough. EXAM: PORTABLE CHEST 1 VIEW COMPARISON:  09/23/2013 FINDINGS: Lungs are hyperexpanded. Infrahilar airspace disease noted right lung base. Left lung clear. Pleural irregularity/scarring at the left base is stable since the remote study. The cardiopericardial silhouette is within normal limits for size. The visualized bony structures of the thorax are unremarkable. Telemetry leads overlie the chest. IMPRESSION: Infrahilar airspace opacity at the right lung base, suspicious for pneumonia. Electronically Signed   By: Misty Stanley M.D.   On: 06/28/2022 07:08     Discharge Instructions: Discharge Instructions     Call MD for:  difficulty breathing, headache or visual disturbances   Complete by: As directed    Call MD for:  persistant dizziness or light-headedness   Complete by: As directed    Call MD for:  temperature >100.4   Complete by: As directed    Diet - low sodium heart healthy   Complete by: As directed    Increase activity slowly   Complete by: As directed      Mr. Jerimie Mancuso  It was a pleasure taking care of you at [place] were admitted for COVID-pneumonia as well as atrial fibrillation with RVR.  We are discharging you home now that you are doing better. Please follow the following instructions.   1) Regarding your COVID-pneumonia, you have responded well to supportive care.  Your symptoms have resolved.  Please continue to monitor for fevers and chills at home.  2) Regarding your new atrial fibrillation, you have converted back to a normal rhythm during hospitalization.  We will send you home on a heart monitor to evaluate to see if you go back into the rhythm.  You have a scheduled appointment with Dr. Sallyanne Kuster on 07/22/2022 at 10 AM.  The address is:  [address], [place], [place] [address].  Please show up.  You will also be discharged on metoprolol tartrate 25 mg twice daily to help with rate control.  You will also be discharged on Eliquis 2.5 mg twice daily which is your blood thinner.  Please take these medications as prescribed.  As you are taking a blood thinner, stop taking aspirin.  3) Regarding your hyperthyroidism (overactive thyroid gland), we have discharged you on methimazole 10 mg twice daily.  Please take this medication as prescribed.  Please follow-up with your outpatient PCP (Dr. Wynetta Emery) on 07/28/2022 at 2:30pm to further address this.   4) If you develop any fever, chills, worsening shortness of breath please return back to the hospital.  Take care,  Dr. Leigh Aurora, DO    Signed: Leigh Aurora, DO 06/30/2022, 2:48 PM   Pager: [phone]

## 2022-06-30 NOTE — TOC Transition Note (Signed)
Transition of Care [facility]) - CM/SW Discharge Note   Patient Details  Name: Brandon Taylor MRN: [id number] Date of Birth: December 30, 1940  Transition of [facility]) CM/SW Contact:  Zenon Mayo, RN Phone Number: 06/30/2022, 2:35 PM   Clinical Narrative:    NCM spoke with paitent , offered choice from Medicare.gov agency list, he states he has no preference, also he states he already has a bsc and a rolling walker at home.  NCM made referral to [facility] with [place].  She is able to take referral.  Soc will begin 24 to 48 hrs post dc. Patient states his wife will transport him home at dc.    Final next level of care: [place] Barriers to Discharge: No Barriers Identified   Patient Goals and CMS Choice Patient states their goals for this hospitalization and ongoing recovery are:: return home CMS Medicare.gov Compare Post Acute Care list provided to:: Patient Choice offered to / list presented to : Patient  Discharge Placement                       Discharge Plan and Services                  DME Agency: NA       HH Arranged: PT, OT HH Agency: [place] Date [place]: 06/30/22 Time [place]: 0102 Representative spoke with at [place]: [place] Determinants of Health ([place]) Interventions     Readmission Risk Interventions     No data to display

## 2022-06-30 NOTE — Evaluation (Signed)
Physical Therapy Evaluation Patient Details Name: Brandon Taylor MRN: [id number] DOB: July 29, 1941 Today's Date: 06/30/2022  History of Present Illness  Pt is a 81 y.o. M who presents 06/27/2022 with cough and shortness of breath. Admitted with COVID pneumonia and new onset atrial fibrillation with RVR. Significant PMH: HTN, mild dementia, CKD stage III.  Clinical Impression  Pt admitted with above. PTA, pt reports he lives with his spouse and is independent. Pt noted to have "mild dementia," per chart and is not oriented to time. Pt presents with decreased cardiopulmonary endurance and balance in comparison to baseline. Ambulating 60 ft with a walker at a min guard assist level. SpO2 96% on RA, HR 88, BP 93/59 (69). Would benefit from HHPT at d/c in order to return to baseline.     Recommendations for follow up therapy are one component of a multi-disciplinary discharge planning process, led by the attending physician.  Recommendations may be updated based on patient status, additional functional criteria and insurance authorization.  Follow Up Recommendations Home health PT      Assistance Recommended at Discharge PRN  Patient can return home with the following  A little help with walking and/or transfers;Assistance with cooking/housework;Direct supervision/assist for medications management;Direct supervision/assist for financial management;Assist for transportation;Help with stairs or ramp for entrance    Equipment Recommendations None recommended by PT  Recommendations for Other Services       Functional Status Assessment Patient has had a recent decline in their functional status and demonstrates the ability to make significant improvements in function in a reasonable and predictable amount of time.     Precautions / Restrictions Precautions Precautions: Fall Restrictions Weight Bearing Restrictions: No      Mobility  Bed Mobility               General bed mobility  comments: Sitting EOB with OT upon entry    Transfers Overall transfer level: Needs assistance Equipment used: Rolling walker (2 wheels) Transfers: Sit to/from Stand Sit to Stand: Supervision                Ambulation/Gait Ambulation/Gait assistance: Min guard Gait Distance (Feet): 60 Feet Assistive device: Rolling walker (2 wheels) Gait Pattern/deviations: Step-through pattern, Decreased stride length Gait velocity: decreased     General Gait Details: Slow and steady pace  Stairs            Wheelchair Mobility    Modified Rankin (Stroke Patients Only)       Balance Overall balance assessment: Mild deficits observed, not formally tested                                           Pertinent Vitals/Pain      Home Living Family/patient expects to be discharged to:: Private residence Living Arrangements: Spouse/significant other Available Help at Discharge: Family Type of Home: House Home Access: Stairs to enter Entrance Stairs-Rails: Right Entrance Stairs-Number of Steps: 2   Home Layout: One level Home Equipment: Conservation officer, nature (2 wheels);Cane - quad;Cane - single point;Shower seat      Prior Function Prior Level of Function : Independent/Modified Independent;Patient poor historian/Family not available             Mobility Comments: reports typically no use of AD needed ADLs Comments: Independent with ADLs, assists with IADLs in the home. works in the garden. spouse does the driving and shopping  mostly     Hand Dominance   Dominant Hand: Right    Extremity/Trunk Assessment   Upper Extremity Assessment Upper Extremity Assessment: Generalized weakness    Lower Extremity Assessment Lower Extremity Assessment: Defer to PT evaluation    Cervical / Trunk Assessment Cervical / Trunk Assessment: Normal  Communication   Communication: No difficulties  Cognition                                                 General Comments      Exercises     Assessment/Plan    PT Assessment Patient needs continued PT services  PT Problem List Decreased strength;Decreased activity tolerance;Decreased mobility;Decreased balance;Decreased cognition;Decreased safety awareness       PT Treatment Interventions DME instruction;Gait training;Stair training;Functional mobility training;Therapeutic activities;Therapeutic exercise;Balance training;Patient/family education    PT Goals (Current goals can be found in the Care Plan section)  Acute Rehab PT Goals Patient Stated Goal: did not state PT Goal Formulation: With patient Time For Goal Achievement: 07/14/22 Potential to Achieve Goals: Good    Frequency Min 3X/week     Co-evaluation               AM-PAC PT "6 Clicks" Mobility  Outcome Measure Help needed turning from your back to your side while in a flat bed without using bedrails?: None Help needed moving from lying on your back to sitting on the side of a flat bed without using bedrails?: A Little Help needed moving to and from a bed to a chair (including a wheelchair)?: A Little Help needed standing up from a chair using your arms (e.g., wheelchair or bedside chair)?: A Little Help needed to walk in hospital room?: A Little Help needed climbing 3-5 steps with a railing? : A Little 6 Click Score: 19    End of Session   Activity Tolerance: Patient tolerated treatment well Patient left: in chair;with call bell/phone within reach;with chair alarm set Nurse Communication: Mobility status PT Visit Diagnosis: Unsteadiness on feet (R26.81)    Time: [id number] PT Time Calculation (min) (ACUTE ONLY): 27 min   Charges:   PT Evaluation $PT Eval Moderate Complexity: 1 Mod PT Treatments $Therapeutic Activity: 8-22 mins        Wyona Almas, PT, DPT Acute Rehabilitation Services Office [phone]   Deno Etienne 06/30/2022, 1:01 PM

## 2022-06-30 NOTE — Progress Notes (Unsigned)
Enrolled for [employer] to mail a ZIO XT long term holter monitor to the patients address on file.  

## 2022-06-30 NOTE — Progress Notes (Signed)
2 weeks Zio monitor ordered per internal medicine request for paroxysmal A fib, office staff will call patient for instruction, new patient appointment arranged with Dr Sallyanne Kuster on 07/22/22 for result review.

## 2022-06-30 NOTE — Evaluation (Signed)
Occupational Therapy Evaluation Patient Details Name: Brandon Taylor MRN: [id number] DOB: Oct 03, 1941 Today's Date: 06/30/2022   History of Present Illness Pt is a 81 y.o. M who presents 06/27/2022 with cough and shortness of breath. Admitted with COVID pneumonia and new onset atrial fibrillation with RVR. Significant PMH: HTN, mild dementia, CKD stage III.   Clinical Impression   PTA, pt lives with spouse, typically Independent with ADLs, basic IADLs and mobility without AD. Pt reports enjoying gardening. Pt presents now with deficits in cognition, dynamic standing balance and strength. Pt received in bed w/ bowel incontinence requiring Mod A for LB ADLs including cleanup. Pt required Min A for initial stand without AD at bedside but improved to Supervision when using RW. Anticipate with continued OOB activity that pt will return to PLOF quickly. Pt may benefit from [facility] if able to obtain and RW (pt unsure if he has this type of RW at home). Would recommend increased supervision/assist initially for daily tasks at home to decrease fall risk and ensure safety.  SpO2 >95% on RA      Recommendations for follow up therapy are one component of a multi-disciplinary discharge planning process, led by the attending physician.  Recommendations may be updated based on patient status, additional functional criteria and insurance authorization.   Follow Up Recommendations  Home health OT    Assistance Recommended at Discharge Intermittent Supervision/Assistance  Patient can return home with the following A little help with walking and/or transfers;A little help with bathing/dressing/bathroom;Assistance with cooking/housework;Direct supervision/assist for medications management    Functional Status Assessment  Patient has had a recent decline in their functional status and demonstrates the ability to make significant improvements in function in a reasonable and predictable amount of time.   Equipment Recommendations  BSC/3in1;Other (comment) (Rolling walker if pt does not have at home)    Recommendations for Other Services       Precautions / Restrictions Precautions Precautions: Fall Restrictions Weight Bearing Restrictions: No      Mobility Bed Mobility Overal bed mobility: Needs Assistance Bed Mobility: Supine to Sit     Supine to sit: Supervision, HOB elevated          Transfers Overall transfer level: Needs assistance Equipment used: Rolling walker (2 wheels), None Transfers: Sit to/from Stand Sit to Stand: Supervision, Min assist           General transfer comment: initial sit to stand without AD required Min A w/ pt reporting he has not stood until now during this admission. Pt progressed to Supervision standing with RW with multiple transfers needed for adequate hygiene and rest breaks      Balance Overall balance assessment: Mild deficits observed, not formally tested                                         ADL either performed or assessed with clinical judgement   ADL Overall ADL's : Needs assistance/impaired Eating/Feeding: Independent   Grooming: Supervision/safety;Standing   Upper Body Bathing: Supervision/ safety;Sitting   Lower Body Bathing: Moderate assistance;Sit to/from stand Lower Body Bathing Details (indicate cue type and reason): assist for thorough posterior hygiene in standing due to profuse amounts of stool and stuck to pt's leg, primofit, etc Upper Body Dressing : Supervision/safety;Sitting   Lower Body Dressing: Minimal assistance;Sit to/from stand Lower Body Dressing Details (indicate cue type and reason): assist for  socks Toilet Transfer: Min guard;Ambulation;Rolling walker (2 wheels)   Toileting- Clothing Manipulation and Hygiene: Sit to/from stand;Moderate assistance       Functional mobility during ADLs: Min guard;Rolling walker (2 wheels)       Vision Ability to See in Adequate  Light: 0 Adequate Patient Visual Report: No change from baseline Vision Assessment?: No apparent visual deficits     Perception     Praxis      Pertinent Vitals/Pain Pain Assessment Pain Assessment: No/denies pain     Hand Dominance Right   Extremity/Trunk Assessment Upper Extremity Assessment Upper Extremity Assessment: Generalized weakness   Lower Extremity Assessment Lower Extremity Assessment: Defer to PT evaluation   Cervical / Trunk Assessment Cervical / Trunk Assessment: Normal   Communication Communication Communication: No difficulties   Cognition Arousal/Alertness: Awake/alert Behavior During Therapy: WFL for tasks assessed/performed, Flat affect Overall Cognitive Status: Impaired/Different from baseline Area of Impairment: Attention, Following commands, Safety/judgement, Awareness, Problem solving                   Current Attention Level: Selective   Following Commands: Follows one step commands with increased time Safety/Judgement: Decreased awareness of deficits Awareness: Intellectual Problem Solving: Slow processing, Difficulty sequencing, Requires verbal cues, Requires tactile cues General Comments: pleasant, slower processing though follows directions consistently. Noted to be sitting in bowel movement in bed with decreased awareness of this and did not appear to have called out for assistance. does demo some insight into deficits reporting feeling more stable with RW once he tried this     General Comments       Exercises     Shoulder Instructions      Home Living Family/patient expects to be discharged to:: Private residence Living Arrangements: Spouse/significant other Available Help at Discharge: Family Type of Home: House Home Access: Stairs to enter Technical brewer of Steps: 2 Entrance Stairs-Rails: Right Home Layout: One level     Bathroom Shower/Tub: Occupational psychologist: Handicapped height     Shallowater: Conservation officer, nature (2 wheels);Cane - quad;Cane - single point;Shower seat          Prior Functioning/Environment Prior Level of Function : Independent/Modified Independent;Patient poor historian/Family not available             Mobility Comments: reports typically no use of AD needed ADLs Comments: Independent with ADLs, assists with IADLs in the home. works in the garden. spouse does the driving and shopping mostly        OT Problem List: Decreased strength;Decreased activity tolerance;Impaired balance (sitting and/or standing);Decreased knowledge of use of DME or AE;Decreased safety awareness;Decreased cognition      OT Treatment/Interventions: Self-care/ADL training;Therapeutic exercise;Energy conservation;DME and/or AE instruction;Therapeutic activities;Patient/family education    OT Goals(Current goals can be found in the care plan section) Acute Rehab OT Goals Patient Stated Goal: go home today, glad to get up and move around OT Goal Formulation: With patient Time For Goal Achievement: 07/14/22 Potential to Achieve Goals: Good  OT Frequency: Min 2X/week    Co-evaluation              AM-PAC OT "6 Clicks" Daily Activity     Outcome Measure Help from another person eating meals?: None Help from another person taking care of personal grooming?: A Little Help from another person toileting, which includes using toliet, bedpan, or urinal?: A Lot Help from another person bathing (including washing, rinsing, drying)?: A Lot Help from another person to put on  and taking off regular upper body clothing?: A Little Help from another person to put on and taking off regular lower body clothing?: A Little 6 Click Score: 17   End of Session Equipment Utilized During Treatment: Rolling walker (2 wheels) Nurse Communication: Mobility status  Activity Tolerance: Patient tolerated treatment well Patient left: Other (comment) (with PT)  OT Visit Diagnosis: Other  abnormalities of gait and mobility (R26.89);Muscle weakness (generalized) (M62.81)                Time: 7622-6333 OT Time Calculation (min): 32 min Charges:  OT General Charges $OT Visit: 1 Visit OT Evaluation $OT Eval Moderate Complexity: 1 Mod OT Treatments $Self Care/Home Management : 8-22 mins  Malachy Chamber, OTR/L Acute Rehab Services Office: [phone]   Layla Maw 06/30/2022, 1:19 PM

## 2022-07-01 ENCOUNTER — Telehealth: Payer: Self-pay

## 2022-07-01 NOTE — Patient Outreach (Signed)
  Care Coordination TOC Note Transition Care Management Follow-up Telephone Call Date of discharge and from where: 06/30/22-[facility]  How have you been since you were released from the hospital? Spoke with spouse who voices patient doing well.  Any questions or concerns? No  Items Reviewed: Did the pt receive and understand the discharge instructions provided? Yes  Medications obtained and verified?  Spouse currently not at home and unable to complete med review She confirms patient has all his meds in the home and no issues regarding them. Other? Yes -RN CM reviewed TOC note by inpatient team as spouse was unaware of info and conversation pt had with case manager. Any new allergies since your discharge? No  Dietary orders reviewed? Yes Do you have support at home? Yes   Home Care and Equipment/Supplies: Were home health services ordered? yes If so, what is the name of the agency? [facility]  Has the agency set up a time to come to the patient's home? no Were any new equipment or medical supplies ordered?  No What is the name of the medical supply agency? N/a Were you able to get the supplies/equipment? not applicable Do you have any questions related to the use of the equipment or supplies? No  Functional Questionnaire: (I = Independent and D = Dependent) ADLs: Assist  Bathing/Dressing- Assist  Meal Prep- Assist  Eating- Assist  Maintaining continence- I  Transferring/Ambulation- I  Managing Meds- Assist  Follow up appointments reviewed:  PCP Hospital f/u appt confirmed? Yes  Scheduled to see PCP on 07/28/22 @ 2:30pm. [facility] f/u appt confirmed? Yes  Scheduled to see cardiologist on 07/1022 @ 10am. Are transportation arrangements needed? No  If their condition worsens, is the pt aware to call PCP or go to the Emergency Dept.? Yes Was the patient provided with contact information for the PCP's office or ED? Yes Was to pt encouraged to call back with  questions or concerns? Yes  SDOH assessments and interventions completed:   Yes  Care Coordination Interventions Activated:  Yes   Care Coordination Interventions:  Assessed SDOH, discussed with spouse inpatient TOC note-including HH services as she was unaware it was ordered    Encounter Outcome:  Pt. Visit Completed    Enzo Montgomery, RN,BSN,CCM [place] [facility] Telephonic Care Management Coordinator Direct Phone: [phone] Toll Free: [phone] Fax: [phone]

## 2022-07-03 LAB — CULTURE, BLOOD (ROUTINE X 2)
Culture: NO GROWTH
Culture: NO GROWTH

## 2022-07-08 DIAGNOSIS — I4891 Unspecified atrial fibrillation: Secondary | ICD-10-CM | POA: Diagnosis not present

## 2022-07-22 ENCOUNTER — Ambulatory Visit: Payer: Medicare Other | Attending: Cardiovascular Disease | Admitting: Cardiovascular Disease

## 2022-07-22 ENCOUNTER — Encounter: Payer: Self-pay | Admitting: Cardiovascular Disease

## 2022-07-22 VITALS — BP 128/62 | HR 62 | Ht 70.0 in | Wt 125.4 lb

## 2022-07-22 DIAGNOSIS — I480 Paroxysmal atrial fibrillation: Secondary | ICD-10-CM | POA: Diagnosis not present

## 2022-07-22 DIAGNOSIS — R636 Underweight: Secondary | ICD-10-CM | POA: Diagnosis not present

## 2022-07-22 DIAGNOSIS — E0590 Thyrotoxicosis, unspecified without thyrotoxic crisis or storm: Secondary | ICD-10-CM | POA: Diagnosis not present

## 2022-07-22 NOTE — Progress Notes (Signed)
Cardiology Office Note:    Date:  07/22/2022   ID:  [name], DOB 12/05/1940, MRN [id number]  PCP:  Ladell Pier, MD   [place] Providers Cardiologist:  None     Referring MD: Ladell Pier, MD   Chief Complaint  Patient presents with   Atrial Fibrillation  Brandon Taylor is a 81 y.o. male who is being seen today for the evaluation of atrial fibrillation  at the request of Ladell Pier, MD.   History of Present Illness:    Brandon Taylor is a 81 y.o. male with a hx of paroxysmal atrial fibrillation  with rapid ventricular response during a recent hospitalization for COVID-pneumonia, at which time he was also diagnosed with hyperthyroidism.    He was started on treatment with beta-blockers, Eliquis and methimazole.  He was discharged from the hospital on September 18, in normal sinus rhythm at the time.  An arrhythmia monitor was placed, but the results of this test are not yet available (they mailed it back last Friday).  Echocardiogram performed during the hospitalization showed borderline reduced LVEF 50-55%.  No major valvular abnormalities were seen, but there was mild-moderate mitral insufficiency.  The left atrium was not dilated.    Note that he has lost about about 22 pounds pounds over the last 6 months, preceding his hospitalization.  He has gained back 15 pounds since then.  TSH was incompletely suppressed at 0.328 and the T4 was elevated at 1.84.  Thyroid-stimulating immunoglobulins were negative.  Past Medical History:  Diagnosis Date   ETOH abuse    GERD (gastroesophageal reflux disease)     Past Surgical History:  Procedure Laterality Date   NO PAST SURGERIES      Current Medications: Current Meds  Medication Sig   apixaban (ELIQUIS) 2.5 MG TABS tablet Take 1 tablet (2.5 mg total) by mouth 2 (two) times daily.   [facility] VITAMIN B12 1000 MCG tablet TAKE 1 TABLET BY MOUTH EVERY DAY (Patient taking differently: Take 1,000 mcg by mouth  daily.)   guaiFENesin (MUCINEX) 600 MG 12 hr tablet Take 1 tablet (600 mg total) by mouth 2 (two) times daily as needed for cough or to loosen phlegm.   latanoprost (XALATAN) 0.005 % ophthalmic solution Place 1 drop into both eyes at bedtime as needed.   methimazole (TAPAZOLE) 10 MG tablet Take 1 tablet (10 mg total) by mouth 2 (two) times daily.   metoprolol tartrate (LOPRESSOR) 25 MG tablet Take 1 tablet (25 mg total) by mouth 2 (two) times daily.   pantoprazole (PROTONIX) 40 MG tablet TAKE 1 TABLET BY MOUTH EVERY DAY (Patient taking differently: Take 40 mg by mouth daily.)   pravastatin (PRAVACHOL) 20 MG tablet Take 1 tablet (20 mg total) by mouth daily.   vitamin D3 (CHOLECALCIFEROL) 25 MCG tablet Take 1 tablet (1,000 Units total) by mouth daily.     Allergies:   Patient has no known allergies.   Social History   Socioeconomic History   Marital status: Married    Spouse name: Not on file   Number of children: Not on file   Years of education: Not on file   Highest education level: Not on file  Occupational History   Not on file  Tobacco Use   Smoking status: Former    Packs/day: 2.00    Years: 40.00    Total pack years: 80.00    Types: Cigarettes    Quit date: 10/13/2012  Years since quitting: 9.7   Smokeless tobacco: Never  Substance and Sexual Activity   Alcohol use: Not Currently    Comment: fifth daily   Drug use: No   Sexual activity: Not on file  Other Topics Concern   Not on file  Social History Narrative   Pt lives in 1 story home with his wife   Has 2 adult daughters - live here in [place]. Attends church weekly.    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[employer] (?)   Social Determinants of [place] Strain: [place]  (12/31/2020)   Overall Financial Resource Strain (CARDIA)    Difficulty of Paying Living Expenses: Not very hard  Food Insecurity: No Food Insecurity (07/01/2022)   Hunger Vital  Sign    Worried About Running Out of Food in the Last Year: Never true    [place] in the Last Year: Never true  Transportation Needs: No Transportation Needs (07/01/2022)   PRAPARE - Hydrologist (Medical): No    Lack of Transportation (Non-Medical): No  Physical Activity: Sufficiently Active (12/31/2020)   Exercise Vital Sign    Days of Exercise per Week: 7 days    Minutes of Exercise per Session: 30 min  Stress: No Stress Concern Present (12/31/2020)   [place]    Feeling of Stress : Not at all  Social Connections: [place] (12/31/2020)   Social Connection and Isolation Panel [NHANES]    Frequency of Communication with Friends and Family: Three times a week    Frequency of Social Gatherings with Friends and Family: Three times a week    Attends Religious Services: More than 4 times per year    Active Member of Clubs or Organizations: Yes    Attends Music therapist: More than 4 times per year    Marital Status: Married     Family History: The patient's family history includes COPD in his brother.  ROS:   Please see the history of present illness.     All other systems reviewed and are negative.  EKGs/Labs/Other Studies Reviewed:    The following studies were reviewed today: Echocardiogram 06/29/2022    1. Left ventricular ejection fraction, by estimation, is 50 to 55%. The  left ventricle has low normal function. Left ventricular endocardial  border not optimally defined to evaluate regional wall motion. Left  ventricular diastolic function could not be  evaluated.   2. Right ventricular systolic function is normal. The right ventricular  size is normal. There is normal pulmonary artery systolic pressure. The  estimated right ventricular systolic pressure is [id number] mmHg.   3. The mitral valve is abnormal. Mild to moderate mitral valve   regurgitation.   4. Tricuspid valve regurgitation is mild to moderate.   5. The aortic valve was not well visualized. Aortic valve regurgitation  is not visualized.   6. The inferior vena cava is normal in size with <50% respiratory  variability, suggesting right atrial pressure of 8 mmHg.   Comparison(s): No prior Echocardiogram.    EKG:  EKG is  ordered today.  The ekg ordered today demonstrates NSR, normal ECG  Recent Labs: 06/27/2022: ALT 23 06/28/2022: B Natriuretic Peptide 82.3; Magnesium 2.1; TSH 0.328 06/30/2022: BUN 43; Creatinine, Ser 1.97; Hemoglobin 11.5; Platelets 530; Potassium 4.4; Sodium 146  Recent Lipid Panel    Component  Value Date/Time   CHOL 201 (H) 12/03/2020 1629   TRIG 72 12/03/2020 1629   HDL 83 12/03/2020 1629   CHOLHDL 2.4 12/03/2020 1629   CHOLHDL 2.5 01/02/2014 0947   VLDL 14 01/02/2014 0947   LDLCALC 105 (H) 12/03/2020 1629     Risk Assessment/Calculations:    CHA2DS2-VASc Score = 2   This indicates a 2.2% annual risk of stroke. The patient's score is based upon: CHF History: 0 HTN History: 0 Diabetes History: 0 Stroke History: 0 Vascular Disease History: 0 Age Score: 2 Gender Score: 0               Physical Exam:    VS:  BP 128/62 (BP Location: Left Arm, Patient Position: Sitting, Cuff Size: Normal)   Pulse 62   Ht 5\' 10"  (1.778 m)   Wt 125 lb 6.4 oz (56.9 kg)   SpO2 96%   BMI 17.99 kg/m     Wt Readings from Last 3 Encounters:  07/22/22 125 lb 6.4 oz (56.9 kg)  06/30/22 117 lb 11.6 oz (53.4 kg)  01/06/22 132 lb 6.4 oz (60.1 kg)     GEN: Very thin.  Well nourished, well developed in no acute distress HEENT: Normal NECK: No JVD; No carotid bruits LYMPHATICS: No lymphadenopathy CARDIAC: RRR, no murmurs, rubs, gallops RESPIRATORY:  Clear to auscultation without rales, wheezing or rhonchi  ABDOMEN: Soft, non-tender, non-distended MUSCULOSKELETAL:  No edema; No deformity  SKIN: Warm and dry NEUROLOGIC:  Alert and  oriented x 3 PSYCHIATRIC:  Normal affect   ASSESSMENT:    1. Paroxysmal atrial fibrillation (HCC)   2. Hyperthyroidism   3. Underweight    PLAN:    In order of problems listed above:  Afib: Occurred in the setting of hyperthyroidism and COVID-19 pneumonia.  We discussed the fact that these conditions may just have brought the arrhythmia to the surface, but that it can recur.  It is possible however that treating his hyperthyroidism will lead to complete resolution of the arrhythmia.  We will review his monitor when it becomes available.  Would prefer to keep him on anticoagulants and beta-blocker for a few months to make sure that the atrial fibrillation does not happen again.  He does not have any other risk factors for embolic events and has minor structural abnormalities on his echocardiogram.  If the arrhythmia monitor is normal and he does not have any clinically evident recurrence of arrhythmia before the end of this year, I would probably recommend discontinuing the Eliquis and the metoprolol.  He is currently on the reduced dose of metoprolol due to low body weight and advanced age. Hyperthyroidism: Suspect that this preceded the COVID-19 pneumonia since he lost about 25 pounds in the preceding 6 months.  He is now on methimazole and from a clinical point of view appears to be euthyroid.  Interestingly his thyroid-stimulating immunoglobulins were negative.  He may have had subacute thyroiditis.  Follow-up thyroid function tests with an endocrinologist and possibly a thyroid iodine uptake scan is recommended. Underweight: Improving with treatment of hyperthyroidism.     They prefer follow-up at our [facility] office which is easier for them to get to.      Medication Adjustments/Labs and Tests Ordered: Current medicines are reviewed at length with the patient today.  Concerns regarding medicines are outlined above.  Orders Placed This Encounter  Procedures   EKG 12-Lead   No  orders of the defined types were placed in this encounter.  Patient Instructions  Medication Instructions:  No changes *If you need a refill on your cardiac medications before your next appointment, please call your pharmacy*   Lab Work: None ordered If you have labs (blood work) drawn today and your tests are completely normal, you will receive your results only by: [place] (if you have MyChart) OR A paper copy in the mail If you have any lab test that is abnormal or we need to change your treatment, we will call you to review the results.   Testing/Procedures: None ordered   Follow-Up: At [facility], you and your health needs are our priority.  As part of our continuing mission to provide you with exceptional heart care, we have created designated Provider Care Teams.  These Care Teams include your primary Cardiologist (physician) and Advanced Practice Providers (APPs -  Physician Assistants and Nurse Practitioners) who all work together to provide you with the care you need, when you need it.  We recommend signing up for the patient portal called "MyChart".  Sign up information is provided on this After Visit Summary.  MyChart is used to connect with patients for Virtual Visits (Telemedicine).  Patients are able to view lab/test results, encounter notes, upcoming appointments, etc.  Non-urgent messages can be sent to your provider as well.   To learn more about what you can do with MyChart, go to [email or web address].    Your next appointment:   Follow up in 3 months with app at [place], Sanda Klein, MD  07/22/2022 10:27 AM    [place]

## 2022-07-22 NOTE — Patient Instructions (Signed)
Medication Instructions:  No changes *If you need a refill on your cardiac medications before your next appointment, please call your pharmacy*   Lab Work: None ordered If you have labs (blood work) drawn today and your tests are completely normal, you will receive your results only by: [place] (if you have MyChart) OR A paper copy in the mail If you have any lab test that is abnormal or we need to change your treatment, we will call you to review the results.   Testing/Procedures: None ordered   Follow-Up: At [facility], you and your health needs are our priority.  As part of our continuing mission to provide you with exceptional heart care, we have created designated Provider Care Teams.  These Care Teams include your primary Cardiologist (physician) and Advanced Practice Providers (APPs -  Physician Assistants and Nurse Practitioners) who all work together to provide you with the care you need, when you need it.  We recommend signing up for the patient portal called "MyChart".  Sign up information is provided on this After Visit Summary.  MyChart is used to connect with patients for Virtual Visits (Telemedicine).  Patients are able to view lab/test results, encounter notes, upcoming appointments, etc.  Non-urgent messages can be sent to your provider as well.   To learn more about what you can do with MyChart, go to [email or web address].    Your next appointment:   Follow up in 3 months with app at [place]

## 2022-07-28 ENCOUNTER — Encounter: Payer: Self-pay | Admitting: Internal Medicine

## 2022-07-28 ENCOUNTER — Ambulatory Visit: Payer: Medicare Other | Attending: Internal Medicine | Admitting: Internal Medicine

## 2022-07-28 VITALS — BP 155/70 | HR 66 | Ht 70.0 in | Wt 127.0 lb

## 2022-07-28 DIAGNOSIS — E559 Vitamin D deficiency, unspecified: Secondary | ICD-10-CM

## 2022-07-28 DIAGNOSIS — U071 COVID-19: Secondary | ICD-10-CM

## 2022-07-28 DIAGNOSIS — D649 Anemia, unspecified: Secondary | ICD-10-CM | POA: Diagnosis not present

## 2022-07-28 DIAGNOSIS — E0590 Thyrotoxicosis, unspecified without thyrotoxic crisis or storm: Secondary | ICD-10-CM | POA: Diagnosis not present

## 2022-07-28 DIAGNOSIS — R7303 Prediabetes: Secondary | ICD-10-CM | POA: Diagnosis not present

## 2022-07-28 DIAGNOSIS — N1832 Chronic kidney disease, stage 3b: Secondary | ICD-10-CM

## 2022-07-28 DIAGNOSIS — D75839 Thrombocytosis, unspecified: Secondary | ICD-10-CM | POA: Diagnosis not present

## 2022-07-28 DIAGNOSIS — J1282 Pneumonia due to coronavirus disease 2019: Secondary | ICD-10-CM

## 2022-07-28 DIAGNOSIS — E785 Hyperlipidemia, unspecified: Secondary | ICD-10-CM

## 2022-07-28 DIAGNOSIS — Z09 Encounter for follow-up examination after completed treatment for conditions other than malignant neoplasm: Secondary | ICD-10-CM | POA: Diagnosis not present

## 2022-07-28 DIAGNOSIS — I480 Paroxysmal atrial fibrillation: Secondary | ICD-10-CM

## 2022-07-28 DIAGNOSIS — I10 Essential (primary) hypertension: Secondary | ICD-10-CM

## 2022-07-28 DIAGNOSIS — E440 Moderate protein-calorie malnutrition: Secondary | ICD-10-CM

## 2022-07-28 DIAGNOSIS — Z23 Encounter for immunization: Secondary | ICD-10-CM

## 2022-07-28 DIAGNOSIS — I4891 Unspecified atrial fibrillation: Secondary | ICD-10-CM | POA: Diagnosis not present

## 2022-07-28 MED ORDER — PRAVASTATIN SODIUM 20 MG PO TABS: 20.0000 mg | ORAL_TABLET | Freq: Every day | ORAL | 0 refills | Status: DC

## 2022-07-28 MED ORDER — GUAIFENESIN ER 600 MG PO TB12: 600.0000 mg | ORAL_TABLET | Freq: Two times a day (BID) | ORAL | 0 refills | Status: DC | PRN

## 2022-07-28 MED ORDER — PRAVASTATIN SODIUM 20 MG PO TABS: 20.0000 mg | ORAL_TABLET | Freq: Every day | ORAL | 1 refills | Status: DC

## 2022-07-28 NOTE — Progress Notes (Signed)
Patient ID: Brandon Taylor, male    DOB: 1941-04-26  MRN: [id number]  CC: Hospitalization Follow-up [facility] f/u. No questions / concerns. Velta Addison to flu vax.)   Subjective: Brandon Taylor is a 81 y.o. male who presents for hospital follow up.  Wife is with him. His concerns today include:  Pt with hx of GERD, ETOH use disorder, vit B12 def, HL, HTN, mild dementia, CKD 3, anemia chronic ds.  Patient hospitalized 9/15-18/2023 with COVID-pneumonia.  He was outside the window for Paxlovid so was treated with supportive care.  He developed atrial fibrillation with RVR.  He spontaneously converted.  TTE revealed low normal LV function of 50 to 55%, mild to moderate mitral regurg and normal LA size.  He was discharged home on metoprolol and Eliquis.  Norvasc was discontinued.  Also discharged on heart monitor.  Found to have hyperthyroidism.  Started on Tapazole 10 mg twice a day. A1c found to be 6.1 which is in the range for prediabetes.  Vitamin D level low at 17. Creatinine 1.97 with GFR of 33.  Patient with known CKD. CBC revealed stable mild anemia but with new thrombocytosis.  Today: Since hospital discharge he has followed up with cardiologist Dr. Sallyanne Kuster. Patient reportedly had lost 22 pounds 6 months prior to hospitalization but had regained 15 pounds by the time he saw the cardiologist.   Weight is almost back to his baseline.  He was in the 130s prior to hospital.  Reports good appetite. -They had mailed in the cardiac monitor but it was not received as yet. -Denies any palpitations, shortness of breath or chest pains. Compliant with Eliquis and metoprolol.  Compliant with Tapazole. -According to the cardiologist, he most likely thinks the atrial fibrillation was due to the hyperthyroidism.  If the arrhythmia monitor is normal and he does not have any clinically evident recurrence of arrhythmia by the end of this year, he would probably recommend discontinuing the Eliquis and  Metoprolol.  Still has a little bit of a wet residual cough from COVID infection.  No shortness of breath.  We discussed the diagnosis of prediabetes.  Patient does not drink much sugary drinks.  He feels his eating habits are good. He is taking vitamin D3 1000 IU daily as prescribed on discharge from the hospital. CKD: Last time GFR was checked by me it was 49 with creatinine of 1.44.  At the time of hospital discharge, GFR was 33 with creatinine of 1.97. HL: Wife is requesting refill on the Pravachol. Albumin level was 2.7.  Albumin level in April of last year was 4.6 .   Patient Active Problem List   Diagnosis Date Noted   PAF (paroxysmal atrial fibrillation) ([place]) 07/28/2022   Hyperthyroidism 07/28/2022   Atrial fibrillation with RVR ([place]) 06/28/2022   Pneumonia due to COVID-19 virus 06/28/2022   CKD (chronic kidney disease), stage III ([place]) 12/04/2020   Anemia, chronic disease 09/28/2019   Mixed hyperlipidemia 07/20/2017   Mild dementia ([place]) 05/29/2017   Surgical site reaction 12/06/2015   Routine general medical examination at a health care facility 10/24/2014   Vitamin B12 deficiency 10/03/2013   Loss of weight 10/03/2013   Alcohol abuse 10/03/2013   Nicotine abuse 10/03/2013   Hypoglycemia 09/22/2013   General weakness 09/22/2013   GERD (gastroesophageal reflux disease)    ETOH abuse      Current Outpatient Medications on File Prior to Visit  Medication Sig Dispense Refill   apixaban (ELIQUIS) 2.5 MG TABS tablet  Take 1 tablet (2.5 mg total) by mouth 2 (two) times daily. 60 tablet 5   [facility] VITAMIN B12 1000 MCG tablet TAKE 1 TABLET BY MOUTH EVERY DAY (Patient taking differently: Take 1,000 mcg by mouth daily.) 100 tablet 0   latanoprost (XALATAN) 0.005 % ophthalmic solution Place 1 drop into both eyes at bedtime as needed.     methimazole (TAPAZOLE) 10 MG tablet Take 1 tablet (10 mg total) by mouth 2 (two) times daily. 60 tablet 2   metoprolol tartrate (LOPRESSOR) 25 MG  tablet Take 1 tablet (25 mg total) by mouth 2 (two) times daily. 60 tablet 5   pantoprazole (PROTONIX) 40 MG tablet TAKE 1 TABLET BY MOUTH EVERY DAY (Patient taking differently: Take 40 mg by mouth daily.) 30 tablet 0   vitamin D3 (CHOLECALCIFEROL) 25 MCG tablet Take 1 tablet (1,000 Units total) by mouth daily. 90 tablet 1   No current facility-administered medications on file prior to visit.    No Known Allergies  Social History   Socioeconomic History   Marital status: Married    Spouse name: Not on file   Number of children: Not on file   Years of education: Not on file   Highest education level: Not on file  Occupational History   Not on file  Tobacco Use   Smoking status: Former    Packs/day: 2.00    Years: 40.00    Total pack years: 80.00    Types: Cigarettes    Quit date: 10/13/2012    Years since quitting: 9.7   Smokeless tobacco: Never  Substance and Sexual Activity   Alcohol use: Not Currently    Comment: fifth daily   Drug use: No   Sexual activity: Not on file  Other Topics Concern   Not on file  Social History Narrative   Pt lives in 1 story home with his wife   Has 2 adult daughters - live here in [place]. Attends church weekly.    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[employer] (?)   Social Determinants of [place] Strain: [place]  (12/31/2020)   Overall Financial Resource Strain (CARDIA)    Difficulty of Paying Living Expenses: Not very hard  Food Insecurity: No Food Insecurity (07/01/2022)   Hunger Vital Sign    Worried About Running Out of Food in the Last Year: Never true    [place] in the Last Year: Never true  Transportation Needs: No Transportation Needs (07/01/2022)   PRAPARE - Hydrologist (Medical): No    Lack of Transportation (Non-Medical): No  Physical Activity: Sufficiently Active (12/31/2020)   Exercise Vital Sign    Days of Exercise per  Week: 7 days    Minutes of Exercise per Session: 30 min  Stress: No Stress Concern Present (12/31/2020)   [place]    Feeling of Stress : Not at all  Social Connections: [place] (12/31/2020)   Social Connection and Isolation Panel [NHANES]    Frequency of Communication with Friends and Family: Three times a week    Frequency of Social Gatherings with Friends and Family: Three times a week    Attends Religious Services: More than 4 times per year    Active Member of Clubs or Organizations: Yes    Attends Archivist Meetings: More than 4 times per year  Marital Status: Married  Human resources officer Violence: Not At Risk (06/28/2022)   Humiliation, Afraid, Rape, and Kick questionnaire    Fear of Current or Ex-Partner: No    Emotionally Abused: No    Physically Abused: No    Sexually Abused: No    Family History  Problem Relation Age of Onset   COPD Brother     Past Surgical History:  Procedure Laterality Date   NO PAST SURGERIES      ROS: Review of Systems Negative except as stated above  PHYSICAL EXAM: BP (!) 155/70   Pulse 66   Ht 5\' 10"  (1.778 m)   Wt 127 lb (57.6 kg)   SpO2 97%   BMI 18.22 kg/m   Wt Readings from Last 3 Encounters:  07/28/22 127 lb (57.6 kg)  07/22/22 125 lb 6.4 oz (56.9 kg)  06/30/22 117 lb 11.6 oz (53.4 kg)    Physical Exam  General appearance -elderly African-American male in NAD.  He appears underweight for height. Mental status -patient answers questions appropriately. Neck - supple, no significant adenopathy.  No thyroid enlargement or nodules appreciated. Chest - clear to auscultation, no wheezes, rales or rhonchi, symmetric air entry Heart - normal rate, regular rhythm, normal S1, S2, no murmurs, rubs, clicks or gallops Extremities - peripheral pulses normal, no pedal edema, no clubbing or cyanosis      Latest Ref Rng & Units 06/30/2022    9:26 AM  06/29/2022    2:16 AM 06/27/2022    7:30 PM  CMP  Glucose 70 - 99 mg/dL 148  120  134   BUN 8 - 23 mg/dL 43  78  126   Creatinine 0.61 - 1.24 mg/dL 1.97  2.14  2.96   Sodium 135 - 145 mmol/L 146  145  143   Potassium 3.5 - 5.1 mmol/L 4.4  4.4  4.6   Chloride 98 - 111 mmol/L 118  115  109   CO2 22 - 32 mmol/L 22  22  21    Calcium 8.9 - 10.3 mg/dL 8.2  8.2  8.9   Total Protein 6.5 - 8.1 g/dL   8.0   Total Bilirubin 0.3 - 1.2 mg/dL   0.9   Alkaline Phos 38 - 126 U/L   74   AST 15 - 41 U/L   28   ALT 0 - 44 U/L   23    Lipid Panel     Component Value Date/Time   CHOL 201 (H) 12/03/2020 1629   TRIG 72 12/03/2020 1629   HDL 83 12/03/2020 1629   CHOLHDL 2.4 12/03/2020 1629   CHOLHDL 2.5 01/02/2014 0947   VLDL 14 01/02/2014 0947   LDLCALC 105 (H) 12/03/2020 1629    CBC    Component Value Date/Time   WBC 9.8 06/30/2022 0926   RBC 3.94 (L) 06/30/2022 0926   HGB 11.5 (L) 06/30/2022 0926   HGB 11.6 (L) 05/02/2021 1140   HCT 35.1 (L) 06/30/2022 0926   HCT 33.2 (L) 05/02/2021 1140   PLT 530 (H) 06/30/2022 0926   PLT 216 05/02/2021 1140   MCV 89.1 06/30/2022 0926   MCV 86 05/02/2021 1140   MCH 29.2 06/30/2022 0926   MCHC 32.8 06/30/2022 0926   RDW 15.3 06/30/2022 0926   RDW 12.0 05/02/2021 1140   LYMPHSABS 0.9 06/30/2022 0926   LYMPHSABS 1.2 05/02/2021 1140   MONOABS 0.8 06/30/2022 0926   EOSABS 0.0 06/30/2022 0926   EOSABS 0.0 05/02/2021 1140   BASOSABS  0.0 06/30/2022 0926   BASOSABS 0.0 05/02/2021 1140    ASSESSMENT AND PLAN:  1. Hospital discharge follow-up   2. Hyperthyroidism He will continue Tapazole for now.  He has been on it for about 1 month.  We will recheck thyroid level.  Refer to endocrinology. - Ambulatory referral to Endocrinology - TSH+T4F+T3Free  3. PAF (paroxysmal atrial fibrillation) (HCC) Continue metoprolol and Eliquis. I was able to see the results of cardiac monitor this evening.  There was some runs of V. tach and SVT.  Message sent to  cardiologist inquiring next step based on results.  4. Vitamin D deficiency Continue vitamin D supplement.  5. Essential hypertension Repeat blood pressure today still not at goal but was normal when he saw the cardiologist 6 days ago.  He will continue the metoprolol.  Prescription given for home blood pressure device.  Request that he checks the blood pressure at least once or twice a week with goal being 130/80 or lower.  Wife will let me know if he runs higher than that. - For home use only DME Other see comment  6. Stage 3b chronic kidney disease ([place]) Worsen in the face of dehydration from COVID infection.  Recheck BMP today.  Advised to avoid NSAIDs. - Basic Metabolic Panel  7. Hyperlipidemia, unspecified hyperlipidemia type - pravastatin (PRAVACHOL) 20 MG tablet; Take 1 tablet (20 mg total) by mouth daily.  Dispense: 90 tablet; Refill: 1  8. Prediabetes Healthy eating habits discussed and encouraged.  9. Thrombocytosis May have been due to COVID infection that was going on at the time. - CBC  10. Moderate protein-calorie malnutrition ([place]) Patient has regained some of his weight now that he is on Tapazole but he is still underweight for height.  Encouraged good eating habits.  11. Pneumonia due to COVID-19 virus Patient requested refill on Mucinex as he still have a residual cough from COVID. - guaiFENesin (MUCINEX) 600 MG 12 hr tablet; Take 1 tablet (600 mg total) by mouth 2 (two) times daily as needed for cough or to loosen phlegm.  Dispense: 30 tablet; Refill: 0  12. Need for influenza vaccination - Flu Vaccine QUAD High Dose(Fluad)  13. Need for first booster dose of COVID-19 vaccine Advised to get the COVID booster vaccine in the next few weeks.  Addendum 07/30/2022: Patient with slight worsening of anemia on CBC.  Iron studies added.  Patient was given the opportunity to ask questions.  Patient verbalized understanding of the plan and was able to repeat key  elements of the plan.   This documentation was completed using Radio producer.  Any transcriptional errors are unintentional.  Orders Placed This Encounter  Procedures   For home use only DME Other see comment   Flu Vaccine QUAD High Dose(Fluad)   99991111   Basic Metabolic Panel   CBC   Ambulatory referral to Endocrinology     Requested Prescriptions   Signed Prescriptions Disp Refills   guaiFENesin (MUCINEX) 600 MG 12 hr tablet 30 tablet 0    Sig: Take 1 tablet (600 mg total) by mouth 2 (two) times daily as needed for cough or to loosen phlegm.   pravastatin (PRAVACHOL) 20 MG tablet 90 tablet 1    Sig: Take 1 tablet (20 mg total) by mouth daily.    Return in about 4 months (around 11/28/2022).  Karle Plumber, MD, FACP

## 2022-07-28 NOTE — Patient Instructions (Signed)
I have referred you to the endocrinologist for the over functioning thyroid.  Continue the methimazole.  We will check your thyroid level today.  Your blood pressure is not controlled.  Goal is 130/80 or lower.  I have given a prescription for you to get a blood pressure device at your pharmacy.  Try to check your blood pressure at least once a week with goal being 130/80 or lower.  Remember to get your COVID-19 vaccine booster.  Prediabetes Eating Plan Prediabetes is a condition that causes blood sugar (glucose) levels to be higher than normal. This increases the risk for developing type 2 diabetes (type 2 diabetes mellitus). Working with a health care provider or nutrition specialist (dietitian) to make diet and lifestyle changes can help prevent the onset of diabetes. These changes may help you: Control your blood glucose levels. Improve your cholesterol levels. Manage your blood pressure. What are tips for following this plan? Reading food labels Read food labels to check the amount of fat, salt (sodium), and sugar in prepackaged foods. Avoid foods that have: Saturated fats. Trans fats. Added sugars. Avoid foods that have more than 300 milligrams (mg) of sodium per serving. Limit your sodium intake to less than 2,300 mg each day. Shopping Avoid buying pre-made and processed foods. Avoid buying drinks with added sugar. Cooking Cook with olive oil. Do not use butter, lard, or ghee. Bake, broil, grill, steam, or boil foods. Avoid frying. Meal planning  Work with your dietitian to create an eating plan that is right for you. This may include tracking how many calories you take in each day. Use a food diary, notebook, or mobile application to track what you eat at each meal. Consider following a Mediterranean diet. This includes: Eating several servings of fresh fruits and vegetables each day. Eating fish at least twice a week. Eating one serving each day of whole grains, beans, nuts,  and seeds. Using olive oil instead of other fats. Limiting alcohol. Limiting red meat. Using nonfat or low-fat dairy products. Consider following a plant-based diet. This includes dietary choices that focus on eating mostly vegetables and fruit, grains, beans, nuts, and seeds. If you have high blood pressure, you may need to limit your sodium intake or follow a diet such as the DASH (Dietary Approaches to Stop Hypertension) eating plan. The DASH diet aims to lower high blood pressure. Lifestyle Set weight loss goals with help from your health care team. It is recommended that most people with prediabetes lose 7% of their body weight. Exercise for at least 30 minutes 5 or more days a week. Attend a support group or seek support from a mental health counselor. Take over-the-counter and prescription medicines only as told by your health care provider. What foods are recommended? Fruits Berries. Bananas. Apples. Oranges. Grapes. Papaya. Mango. Pomegranate. Kiwi. Grapefruit. Cherries. Vegetables Lettuce. Spinach. Peas. Beets. Cauliflower. Cabbage. Broccoli. Carrots. Tomatoes. Squash. Eggplant. Herbs. Peppers. Onions. Cucumbers. Brussels sprouts. Grains Whole grains, such as whole-wheat or whole-grain breads, crackers, cereals, and pasta. Unsweetened oatmeal. Bulgur. Barley. Quinoa. Brown rice. Corn or whole-wheat flour tortillas or taco shells. Meats and other proteins Seafood. Poultry without skin. Lean cuts of pork and beef. Tofu. Eggs. Nuts. Beans. Dairy Low-fat or fat-free dairy products, such as yogurt, cottage cheese, and cheese. Beverages Water. Tea. Coffee. Sugar-free or diet soda. Seltzer water. Low-fat or nonfat milk. Milk alternatives, such as soy or almond milk. Fats and oils Olive oil. Canola oil. Sunflower oil. Grapeseed oil. Avocado. Walnuts. Sweets and  desserts Sugar-free or low-fat pudding. Sugar-free or low-fat ice cream and other frozen treats. Seasonings and  condiments Herbs. Sodium-free spices. Mustard. Relish. Low-salt, low-sugar ketchup. Low-salt, low-sugar barbecue sauce. Low-fat or fat-free mayonnaise. The items listed above may not be a complete list of recommended foods and beverages. Contact a dietitian for more information. What foods are not recommended? Fruits Fruits canned with syrup. Vegetables Canned vegetables. Frozen vegetables with butter or cream sauce. Grains Refined white flour and flour products, such as bread, pasta, snack foods, and cereals. Meats and other proteins Fatty cuts of meat. Poultry with skin. Breaded or fried meat. Processed meats. Dairy Full-fat yogurt, cheese, or milk. Beverages Sweetened drinks, such as iced tea and soda. Fats and oils Butter. Lard. Ghee. Sweets and desserts Baked goods, such as cake, cupcakes, pastries, cookies, and cheesecake. Seasonings and condiments Spice mixes with added salt. Ketchup. Barbecue sauce. Mayonnaise. The items listed above may not be a complete list of foods and beverages that are not recommended. Contact a dietitian for more information. Where to find more information American Diabetes Association: www.diabetes.org Summary You may need to make diet and lifestyle changes to help prevent the onset of diabetes. These changes can help you control blood sugar, improve cholesterol levels, and manage blood pressure. Set weight loss goals with help from your health care team. It is recommended that most people with prediabetes lose 7% of their body weight. Consider following a Mediterranean diet. This includes eating plenty of fresh fruits and vegetables, whole grains, beans, nuts, seeds, fish, and low-fat dairy, and using olive oil instead of other fats. This information is not intended to replace advice given to you by your health care provider. Make sure you discuss any questions you have with your health care provider. Document Revised: 12/29/2019 Document Reviewed:  12/29/2019 Elsevier Patient Education  [place].

## 2022-07-29 LAB — BASIC METABOLIC PANEL
BUN/Creatinine Ratio: 13 (ref 10–24)
BUN: 18 mg/dL (ref 8–27)
CO2: 22 mmol/L (ref 20–29)
Calcium: 8.6 mg/dL (ref 8.6–10.2)
Chloride: 107 mmol/L — ABNORMAL HIGH (ref 96–106)
Creatinine, Ser: 1.35 mg/dL — ABNORMAL HIGH (ref 0.76–1.27)
Glucose: 83 mg/dL (ref 70–99)
Potassium: 5.2 mmol/L (ref 3.5–5.2)
Sodium: 143 mmol/L (ref 134–144)
eGFR: 52 mL/min/{1.73_m2} — ABNORMAL LOW (ref >59)

## 2022-07-29 LAB — CBC
Hematocrit: 29.8 % — ABNORMAL LOW (ref 37.5–51.0)
Hemoglobin: 10.4 g/dL — ABNORMAL LOW (ref 13.0–17.7)
MCH: 30.1 pg (ref 26.6–33.0)
MCHC: 34.9 g/dL (ref 31.5–35.7)
MCV: 86 fL (ref 79–97)
Platelets: 266 10*3/uL (ref 150–450)
RBC: 3.45 x10E6/uL — ABNORMAL LOW (ref 4.14–5.80)
RDW: 14.3 % (ref 11.6–15.4)
WBC: 5.4 10*3/uL (ref 3.4–10.8)

## 2022-07-29 LAB — TSH+T4F+T3FREE
Free T4: 1.09 ng/dL (ref 0.82–1.77)
T3, Free: 2.4 pg/mL (ref 2.0–4.4)
TSH: 4.27 u[IU]/mL (ref 0.450–4.500)

## 2022-07-30 ENCOUNTER — Other Ambulatory Visit: Payer: Self-pay

## 2022-07-30 NOTE — Addendum Note (Signed)
Addended by: Karle Plumber B on: 07/30/2022 11:13 AM   Modules accepted: Orders

## 2022-07-30 NOTE — Progress Notes (Signed)
Let patient and wife know that his kidney function is not at 100% but improved significantly from when he was in the hospital. He is still anemic and slightly worse than at hospital discharge.  I have asked the lab to check iron level on blood that was already drawn.  We will let him know once we have the results.

## 2022-07-31 LAB — IRON,TIBC AND FERRITIN PANEL
Ferritin: 465 ng/mL — ABNORMAL HIGH (ref 30–400)
Iron Saturation: 16 % (ref 15–55)
Iron: 37 ug/dL — ABNORMAL LOW (ref 38–169)
Total Iron Binding Capacity: 232 ug/dL — ABNORMAL LOW (ref 250–450)
UIBC: 195 ug/dL (ref 111–343)

## 2022-07-31 LAB — SPECIMEN STATUS REPORT

## 2022-08-07 ENCOUNTER — Other Ambulatory Visit: Payer: Self-pay | Admitting: Internal Medicine

## 2022-08-07 ENCOUNTER — Other Ambulatory Visit: Payer: Self-pay

## 2022-08-07 MED ORDER — FERROUS SULFATE 325 (65 FE) MG PO TABS: ORAL_TABLET | ORAL | 0 refills | Status: DC

## 2022-08-12 ENCOUNTER — Telehealth: Payer: Self-pay

## 2022-08-12 NOTE — Telephone Encounter (Signed)
Pt was called and is aware of results, DOB was confirmed.  ?

## 2022-08-12 NOTE — Telephone Encounter (Signed)
-----   Message from Ladell Pier, MD sent at 08/07/2022  9:03 PM EDT ----- Let pt and wife know that based on iron studies, I recommend taking iron supplement four times a wk. I sent a prescription to his pharmacy.  If it is not cover by his insurance, he can purchase iron supplement over the counter.

## 2022-08-14 DIAGNOSIS — H401232 Low-tension glaucoma, bilateral, moderate stage: Secondary | ICD-10-CM | POA: Diagnosis not present

## 2022-08-25 ENCOUNTER — Other Ambulatory Visit (HOSPITAL_COMMUNITY): Payer: Self-pay

## 2022-08-25 ENCOUNTER — Ambulatory Visit: Payer: Self-pay | Admitting: *Deleted

## 2022-08-25 NOTE — Telephone Encounter (Signed)
  Caller requesting a cb to discuss if pt should continue taking metoprolol tartrate (LOPRESSOR) 25 MG tablet  Please advise       Chief Complaint: Medication Question Symptoms: None Frequency: NA Pertinent Negatives: Patient denies any symptoms Disposition: [] ED /[] Urgent Care (no appt availability in office) / [] Appointment(In office/virtual)/ []  [place] [facility]/ [] Home Care/ [] Refused Recommended Disposition /[] [place] [facility]/ [x]  Follow-up with PCP Additional Notes: Pt's wife calling, on DPR. States  Metoprolol  Methimazole and Eliquis   were prescribed in hospital. Pt had OV 07/28/22, noted to continue those meds.   Wife states they are trying to get life insurance for pt and as long as he is on those meds it will be denied. States was told he should be off meds by end of year. Assured pt and wife NT would route to practice for PCPs review and final disposition. Please advise.  Reason for Disposition  [1] Caller has NON-URGENT medicine question about med that PCP prescribed AND [2] triager unable to answer question  Answer Assessment - Initial Assessment Questions 1. NAME of MEDICINE: "What medicine(s) are you calling about?"     Metoprolol  Methimazole Eliquis 2. QUESTION: "What is your question?" (e.g., double dose of medicine, side effect)     Does he still need to take? 3. PRESCRIBER: "Who prescribed the medicine?" Reason: if prescribed by specialist, call should be referred to that group.     I hospital 4. SYMPTOMS: "Do you have any symptoms?" If Yes, ask: "What symptoms are you having?"  "How bad are the symptoms (e.g., mild, moderate, severe)     none  Protocols used: Medication Question Call-A-AH

## 2022-08-25 NOTE — Telephone Encounter (Signed)
Per wife, he is out of these medications. Did not want if they needed a refill.  Also, was told that he will not qualify for life insurance if taking these  Rx.  Medication prescribe inpatient upon discharge.   Pls advise.

## 2022-08-26 NOTE — Telephone Encounter (Signed)
Patient wife notified of message from Dr. Laural Benes. States she will get all 3 medications from pharmacy refilled.

## 2022-08-27 ENCOUNTER — Telehealth: Payer: Self-pay | Admitting: Internal Medicine

## 2022-08-27 MED ORDER — METOPROLOL TARTRATE 25 MG PO TABS: 25.0000 mg | ORAL_TABLET | Freq: Two times a day (BID) | ORAL | 1 refills | Status: DC

## 2022-08-27 MED ORDER — METHIMAZOLE 10 MG PO TABS: 10.0000 mg | ORAL_TABLET | Freq: Two times a day (BID) | ORAL | 1 refills | Status: DC

## 2022-08-27 MED ORDER — APIXABAN 2.5 MG PO TABS: 2.5000 mg | ORAL_TABLET | Freq: Two times a day (BID) | ORAL | 1 refills | Status: DC

## 2022-08-27 NOTE — Telephone Encounter (Signed)
Requested medication (s) are due for refill today:yes  Requested medication (s) are on the active medication list: yes  Last refill:  06/30/22  Future visit scheduled: yes  Notes to clinic:  Unable to refill per protocol, last refill by another provider.      Requested Prescriptions  Pending Prescriptions Disp Refills   apixaban (ELIQUIS) 2.5 MG TABS tablet 60 tablet 5    Sig: Take 1 tablet (2.5 mg total) by mouth 2 (two) times daily.     Hematology:  Anticoagulants - apixaban Failed - 08/27/2022  2:22 PM      Failed - HGB in normal range and within 360 days    Hemoglobin  Date Value Ref Range Status  07/28/2022 10.4 (L) 13.0 - 17.7 g/dL Final         Failed - HCT in normal range and within 360 days    Hematocrit  Date Value Ref Range Status  07/28/2022 29.8 (L) 37.5 - 51.0 % Final         Failed - Cr in normal range and within 360 days    Creat  Date Value Ref Range Status  10/24/2014 1.19 0.50 - 1.35 mg/dL Final   Creatinine, Ser  Date Value Ref Range Status  07/28/2022 1.35 (H) 0.76 - 1.27 mg/dL Final         Passed - PLT in normal range and within 360 days    Platelets  Date Value Ref Range Status  07/28/2022 266 150 - 450 x10E3/uL Final         Passed - AST in normal range and within 360 days    AST  Date Value Ref Range Status  06/27/2022 28 15 - 41 U/L Final         Passed - ALT in normal range and within 360 days    ALT  Date Value Ref Range Status  06/27/2022 23 0 - 44 U/L Final         Passed - Valid encounter within last 12 months    Recent Outpatient Visits           1 month ago Hospital discharge follow-up   [facility] Marcine Matar, MD   2 months ago COVID-19 virus infection   [facility] Marcine Matar, MD   10 months ago Essential hypertension   [facility] Marcine Matar, MD   1 year ago Essential hypertension   [facility] [place], [place], [place]   1 year ago Encounter for [employer] annual wellness exam   [facility] Lois Huxley, Cornelius Moras, RPH-CPP       Future Appointments             In 2 months Swinyer, Zachary George, NP [facility] [address] A Dept Of [place]. [facility], [facility]   In 3 months Laural Benes, Binnie Rail, MD [facility]             metoprolol tartrate (LOPRESSOR) 25 MG tablet 60 tablet 5    Sig: Take 1 tablet (25 mg total) by mouth 2 (two) times daily.     Cardiovascular:  Beta Blockers Failed - 08/27/2022  2:22 PM      Failed - Last BP in normal range    BP Readings from Last 1 Encounters:  07/28/22 (!) 155/70  Passed - Last Heart Rate in normal range    Pulse Readings from Last 1 Encounters:  07/28/22 66         Passed - Valid encounter within last 6 months    Recent Outpatient Visits           1 month ago Hospital discharge follow-up   [facility] Marcine Matar, MD   2 months ago COVID-19 virus infection   [facility] Marcine Matar, MD   10 months ago Essential hypertension   [facility] Marcine Matar, MD   1 year ago Essential hypertension   [facility] [place], Marzella Schlein, [place]   1 year ago Encounter for [employer] annual wellness exam   [facility] Lois Huxley, Cornelius Moras, RPH-CPP       Future Appointments             In 2 months Swinyer, Zachary George, NP [facility] [address] A Dept Of [place]. [facility], [facility]   In 3 months Laural Benes, Binnie Rail, MD [facility]             methimazole (TAPAZOLE) 10 MG tablet 60 tablet 2    Sig: Take 1 tablet (10 mg total) by mouth 2 (two) times daily.     Not Delegated - Endocrinology:  Hyperthyroid  Agents Failed - 08/27/2022  2:22 PM      Failed - This refill cannot be delegated      Passed - TSH in normal range and within 180 days    TSH  Date Value Ref Range Status  07/28/2022 4.270 0.450 - 4.500 uIU/mL Final         Passed - T3 Total in normal range and within 180 days    T3, Total  Date Value Ref Range Status  06/29/2022 65 (L) 71 - 180 ng/dL Final    Comment:    (NOTE) Performed At: [facility] [address] [place], [place] [id number] Jolene Schimke MD BO:[id number]    T3, Free  Date Value Ref Range Status  07/28/2022 2.4 2.0 - 4.4 pg/mL Final         Passed - T4 free in normal range and within 180 days    T4, Total  Date Value Ref Range Status  05/06/2017 8.0 4.5 - 12.0 ug/dL Final   Free T4  Date Value Ref Range Status  07/28/2022 1.09 0.82 - 1.77 ng/dL Final         Passed - Valid encounter within last 6 months    Recent Outpatient Visits           1 month ago Hospital discharge follow-up   [facility] Marcine Matar, MD   2 months ago COVID-19 virus infection   [facility] Marcine Matar, MD   10 months ago Essential hypertension   [facility] Marcine Matar, MD   1 year ago Essential hypertension    [facility] Wellness [place], [place], [place]   1 year ago Encounter for [employer] annual wellness exam   [facility] Lois Huxley, Cornelius Moras, RPH-CPP       Future Appointments             In 2 months Swinyer,  Zachary George, NP [facility] [address] A Dept Of [place]. [facility], [facility]   In 3 months Laural Benes, Binnie Rail, MD [facility]

## 2022-08-27 NOTE — Telephone Encounter (Signed)
Pt requests that the Rx refills be transferred to another pharmacy. Pt told the request needs to be sent by provider  Medication Refill - Medication: apixaban (ELIQUIS) 2.5 MG TABS tablet, metoprolol tartrate (LOPRESSOR) 25 MG tablet, and methimazole (TAPAZOLE) 10 MG tablet   Has the patient contacted their pharmacy? Yes.   Pt told to contact pcp  Preferred Pharmacy (with phone number or street name):  [id number] , [place] - [address] Phone: [phone]  Fax: [phone]     Has the patient been seen for an appointment in the last year OR does the patient have an upcoming appointment? Yes.    Agent: Please be advised that RX refills may take up to 3 business days. We ask that you follow-up with your pharmacy.

## 2022-08-28 ENCOUNTER — Other Ambulatory Visit: Payer: Self-pay

## 2022-08-28 NOTE — Telephone Encounter (Signed)
In regards to pt's apixaban (ELIQUIS) 2.5 MG TABS tablet . Wife has called and pharmacy has stated it is over $150.00. Pt states that no way can afford. Request something else be called in that will be cheaper? Pls fu with pt as states pt needs med. FU at [phone]

## 2022-08-29 NOTE — Telephone Encounter (Signed)
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  I thought the message was forwarded on yesterday instead of Kelly. Pt unable to afford Brilinta, applying for PASS with [employer] Endo.   Please advise

## 2022-09-01 ENCOUNTER — Other Ambulatory Visit: Payer: Self-pay

## 2022-09-02 ENCOUNTER — Other Ambulatory Visit: Payer: Self-pay

## 2022-09-30 ENCOUNTER — Other Ambulatory Visit: Payer: Self-pay | Admitting: Internal Medicine

## 2022-09-30 NOTE — Telephone Encounter (Signed)
Medication Refill - Medication:  vitamin D3 (CHOLECALCIFEROL) 25 MCG tablet   Has the patient contacted their pharmacy? No.  Preferred Pharmacy (with phone number or street name):  [id number] [place], [place] - [address] Phone: [phone]  Fax: [phone]      Has the patient been seen for an appointment in the last year OR does the patient have an upcoming appointment? Yes.    Agent: Please be advised that RX refills may take up to 3 business days. We ask that you follow-up with your pharmacy.

## 2022-09-30 NOTE — Telephone Encounter (Signed)
Requested medication (s) are due for refill today: No   Requested medication (s) are on the active medication list: yes    Last refill: 06/30/22   #90  1 refill  Future visit scheduled  yes 11/28/22  Notes to clinic: Historical provider. Please review. Thank you.  Requested Prescriptions  Pending Prescriptions Disp Refills   vitamin D3 (CHOLECALCIFEROL) 25 MCG tablet 90 tablet 1    Sig: Take 1 tablet (1,000 Units total) by mouth daily.     Endocrinology:  Vitamins - Vitamin D Supplementation 2 Failed - 09/30/2022 12:07 PM      Failed - Manual Review: Route requests for 50,000 IU strength to the provider      Failed - Vitamin D in normal range and within 360 days    Vit D, 25-Hydroxy  Date Value Ref Range Status  06/28/2022 17.22 (L) 30 - 100 ng/mL Final    Comment:    (NOTE) Vitamin D deficiency has been defined by the Institute of Medicine  and an Endocrine Society practice guideline as a level of serum 25-OH  vitamin D less than 20 ng/mL (1,2). The Endocrine Society went on to  further define vitamin D insufficiency as a level between 21 and 29  ng/mL (2).  1. IOM (Institute of Medicine). 2010. Dietary reference intakes for  calcium and D. [place]: [employer]. 2. Holick MF, Binkley Carey, Bischoff-Ferrari HA, et al. Evaluation,  treatment, and prevention of vitamin D deficiency: an Endocrine  Society clinical practice guideline, JCEM. 2011 Jul; 96(7): 1911-30.  Performed at [facility], [address], [place], [place] [address]          Passed - Ca in normal range and within 360 days    Calcium  Date Value Ref Range Status  07/28/2022 8.6 8.6 - 10.2 mg/dL Final         Passed - Valid encounter within last 12 months    Recent Outpatient Visits           2 months ago Hospital discharge follow-up   [facility] Marcine Matar, MD   3 months ago COVID-19 virus infection   [facility] Marcine Matar, MD   11 months ago Essential hypertension   [facility] Marcine Matar, MD   1 year ago Essential hypertension   [facility] Well[facility] [place], [place]zella [place]lein, [place]   1 year ago Encounter for [employer] annual wellness exam   [facility] Lois Huxley, Cornelius Moras, RPH-CPP       Future Appointments             In 3 weeks Swinyer, Zachary George, NP [facility] [address] A Dept Of [place]. [facility], [facility]   In 1 month Laural Benes, Binnie Rail, MD [facility]

## 2022-10-01 MED ORDER — VITAMIN D3 25 MCG PO TABS: 1000.0000 [IU] | ORAL_TABLET | Freq: Every day | ORAL | 1 refills | Status: DC

## 2022-10-15 ENCOUNTER — Encounter: Payer: Self-pay | Admitting: Internal Medicine

## 2022-10-15 NOTE — Progress Notes (Signed)
I received a note from [place] done by NP Arville Lime 10/01/2022.  ABI screening showed an ABI on the right foot to be 0.69 and on the left foot 0.78.

## 2022-10-23 NOTE — Progress Notes (Signed)
Cardiology Office Note:    Date:  10/27/2022   ID:  Brandon Taylor, DOB 1941-08-12, MRN [id number]  PCP:  Brandon Pier, MD   [facility] Providers Cardiologist:  None     Referring MD: Brandon Pier, MD   Chief Complaint: follow-up AF with RVR  History of Present Illness:    Brandon Taylor is a pleasant 82 y.o. male with a hx of paroxysmal atrial fibrillation with rapid ventricular response during hospitalization for COVID-pneumonia at which time he was also diagnosed with hyperthyroidism. No previous history of care with our group.  Past medical history hypertension, hyperlipidemia, GERD, vitamin B12 deficiency, dementia.  Admission 9/16-9/18/23 admitted for management of atrial fibrillation with RVR in the setting of COVID infection.  He presented with weeklong history of cough and shortness of breath.  Cough productive with yellow sputum.  Additionally having loss of appetite and fatigue.  In ED, he was found to have irregular heart rhythm with rate of 102.  EKG showed atrial fibrillation with RVR with rate of 140.  He was started on Cardizem drip. CXR revealed possible right lower lung pneumonia, WBC 12.7, tachypnea with RR 26, normotensive.  Echocardiogram revealed borderline reduced LVEF 50 to 55%, mild to moderate mitral insufficiency, LA normal size. He was started on Eliquis and metoprolol.   At office visit 07/22/2022 with Dr. Sallyanne Taylor, he reported 22 pound weight loss over the previous 6 months. Gained 15 lbs back. TSH was incompletely suppressed at 0.328 and T4 was elevated at 1.84. Thyroid-stimulating immunoglobulins were negative. He was maintaining sinus rhythm at that visit. Awaiting results of cardiac monitor.   Cardiac monitor revealed no atrial fibrillation. There were occasional very brief episodes of nonsustained ectopic atrial tachycardia and very brief episodes of nonsustained VT.  No evidence of meaningful bradycardia.  Per Dr. Sallyanne Taylor, continue Eliquis and  metoprolol through the end of the year and at follow-up visit, if no evidence of atrial fibrillation could consider discontinuing.  Today, he is here with his wife for follow-up. He reports he is feeling well and has no cardiac concerns. He reports he is eating well. No chest pain, dyspnea, orthopnea, PND, tachycardia, palpitations, presyncope, syncope. No evidence of bleeding.   Past Medical History:  Diagnosis Date   ETOH abuse    GERD (gastroesophageal reflux disease)     Past Surgical History:  Procedure Laterality Date   NO PAST SURGERIES      Current Medications: Current Meds  Medication Sig   apixaban (ELIQUIS) 2.5 MG TABS tablet Take 1 tablet (2.5 mg total) by mouth 2 (two) times daily.   [facility] VITAMIN B12 1000 MCG tablet TAKE 1 TABLET BY MOUTH EVERY DAY (Patient taking differently: Take 1,000 mcg by mouth daily.)   ferrous sulfate 325 (65 FE) MG tablet Take 1 tab PO four times a week   guaiFENesin (MUCINEX) 600 MG 12 hr tablet Take 1 tablet (600 mg total) by mouth 2 (two) times daily as needed for cough or to loosen phlegm.   latanoprost (XALATAN) 0.005 % ophthalmic solution Place 1 drop into both eyes at bedtime as needed.   methimazole (TAPAZOLE) 10 MG tablet Take 1 tablet (10 mg total) by mouth 2 (two) times daily.   metoprolol tartrate (LOPRESSOR) 25 MG tablet Take 1 tablet (25 mg total) by mouth 2 (two) times daily.   pantoprazole (PROTONIX) 40 MG tablet TAKE 1 TABLET BY MOUTH EVERY DAY (Patient taking differently: Take 40 mg by mouth daily.)  pravastatin (PRAVACHOL) 20 MG tablet Take 1 tablet (20 mg total) by mouth daily.   vitamin D3 (CHOLECALCIFEROL) 25 MCG tablet Take 1 tablet (1,000 Units total) by mouth daily.     Allergies:   Patient has no known allergies.   Social History   Socioeconomic History   Marital status: Married    Spouse name: Not on file   Number of children: Not on file   Years of education: Not on file   Highest education level: Not on file   Occupational History   Not on file  Tobacco Use   Smoking status: Former    Packs/day: 2.00    Years: 40.00    Total pack years: 80.00    Types: Cigarettes    Quit date: 10/13/2012    Years since quitting: 10.0   Smokeless tobacco: Never  Substance and Sexual Activity   Alcohol use: Not Currently    Comment: fifth daily   Drug use: No   Sexual activity: Not on file  Other Topics Concern   Not on file  Social History Narrative   Pt lives in 1 story home with his wife   Has 2 adult daughters - live here in [place]. Attends church weekly.    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[employer] (?)   Social Determinants of Health   Financial Resource Strain: Low Risk  (12/31/2020)   Overall Financial Resource Strain (CARDIA)    Difficulty of Paying Living Expenses: Not very hard  Food Insecurity: No Food Insecurity (07/01/2022)   Hunger Vital Sign    Worried About Running Out of Food in the Last Year: Never true    Ran Out of Food in the Last Year: Never true  Transportation Needs: No Transportation Needs (07/01/2022)   PRAPARE - Administrator, Civil Service (Medical): No    Lack of Transportation (Non-Medical): No  Physical Activity: Sufficiently Active (12/31/2020)   Exercise Vital Sign    Days of Exercise per Week: 7 days    Minutes of Exercise per Session: 30 min  Stress: No Stress Concern Present (12/31/2020)   [employer] of Occupational Health - Occupational Stress Questionnaire    Feeling of Stress : Not at all  Social Connections: Socially Integrated (12/31/2020)   Social Connection and Isolation Panel [NHANES]    Frequency of Communication with Friends and Family: Three times a week    Frequency of Social Gatherings with Friends and Family: Three times a week    Attends Religious Services: More than 4 times per year    Active Member of Clubs or Organizations: Yes    Attends Engineer, structural: More  than 4 times per year    Marital Status: Married     Family History: The patient's family history includes COPD in his brother.  ROS:   Please see the history of present illness.  All other systems reviewed and are negative.  Labs/Other Studies Reviewed:    The following studies were reviewed today:  Cardiac monitor 07/28/22   The dominant rhythm is normal sinus rhythm with markedly reduced circadian variation (blunted heart variability, likely due to medications).   There is no evidence of atrial fibrillation.   There are occasional very brief episodes of nonsustained ectopic atrial tachycardia (maximum 13 beats) and very brief episodes of nonsustained ventricular tachycardia (maximum 5 beats).   There is no evidence of meaningful bradycardia.   No symptom driven  recordings were submitted for analysis.   Mildly abnormal arrhythmia monitor due to the occurrence of rare episodes of very brief nonsustained ectopic atrial tachycardia and nonsustained ventricular tachycardia.   No atrial fibrillation is seen. There is marked blunting of daily circadian rhythm variation, likely due to treatment with beta-blockers, but cannot exclude a component of age-related sinus node dysfunction with chronotropic incompetence.      Echo 06/29/22  1. Left ventricular ejection fraction, by estimation, is 50 to 55%. The  left ventricle has low normal function. Left ventricular endocardial  border not optimally defined to evaluate regional wall motion. Left  ventricular diastolic function could not be  evaluated.   2. Right ventricular systolic function is normal. The right ventricular  size is normal. There is normal pulmonary artery systolic pressure. The  estimated right ventricular systolic pressure is 60.6 mmHg.   3. The mitral valve is abnormal. Mild to moderate mitral valve  regurgitation.   4. Tricuspid valve regurgitation is mild to moderate.   5. The aortic valve was not well visualized.  Aortic valve regurgitation  is not visualized.   6. The inferior vena cava is normal in size with <50% respiratory  variability, suggesting right atrial pressure of 8 mmHg.   Comparison(s): No prior Echocardiogram. Recent Labs: 06/27/2022: ALT 23 06/28/2022: B Natriuretic Peptide 82.3; Magnesium 2.1 07/28/2022: BUN 18; Creatinine, Ser 1.35; Hemoglobin 10.4; Platelets 266; Potassium 5.2; Sodium 143; TSH 4.270  Recent Lipid Panel    Component Value Date/Time   CHOL 201 (H) 12/03/2020 1629   TRIG 72 12/03/2020 1629   HDL 83 12/03/2020 1629   CHOLHDL 2.4 12/03/2020 1629   CHOLHDL 2.5 01/02/2014 0947   VLDL 14 01/02/2014 0947   LDLCALC 105 (H) 12/03/2020 1629     Risk Assessment/Calculations:    CHA2DS2-VASc Score = 2  {This indicates a 2.2% annual risk of stroke. The patient's score is based upon: CHF History: 0 HTN History: 0 Diabetes History: 0 Stroke History: 0 Vascular Disease History: 0 Age Score: 2 Gender Score: 0    Physical Exam:    VS:  BP 106/60   Pulse 65   Ht 5\' 10"  (1.778 m)   Wt 127 lb (57.6 kg)   SpO2 96%   BMI 18.22 kg/m     Wt Readings from Last 3 Encounters:  10/27/22 127 lb (57.6 kg)  07/28/22 127 lb (57.6 kg)  07/22/22 125 lb 6.4 oz (56.9 kg)     GEN: Thin, frail elderly in no acute distress HEENT: Normal NECK: No JVD; No carotid bruits CARDIAC: RRR, no murmurs, rubs, gallops RESPIRATORY:  Clear to auscultation without rales, wheezing or rhonchi  ABDOMEN: Soft, non-tender, non-distended MUSCULOSKELETAL:  No edema; No deformity. 2+ pedal pulses, equal bilaterally SKIN: Warm and dry NEUROLOGIC:  Alert and oriented x 3 PSYCHIATRIC:  Normal affect   EKG:  EKG is not ordered today.    Diagnoses:    1. Paroxysmal atrial fibrillation (HCC)   2. Underweight   3. Chronic anticoagulation    Assessment and Plan:     PAF on chronic anticoagulation: Atrial fibrillation with RVR during hospitalization with COVID and pneumonia 06/2022. No  evidence of a fib on cardiac monitor 07/2022. Appears to be maintaining sinus rhythm on exam today. He is asymptomatic. Discussion regarding continuing rate control with metoprolol and Eliquis for stroke prevention.  He would like to continue the medications for now.  Advised him to notify us if he has bleeding concerns or other concerns  prior to next office visit. Eliquis 2.5 mg twice daily is appropriate dose for his weight/age. Will check BMP and CBC today for monitoring.   Malnutrition: Reports his appetite is good, eats all that he wants. Weight is stable.   Hypertension: BP is well controlled.      Disposition:  6 months with me  Medication Adjustments/Labs and Tests Ordered: Current medicines are reviewed at length with the patient today.  Concerns regarding medicines are outlined above.  Orders Placed This Encounter  Procedures   CBC   Basic Metabolic Panel (BMET)   No orders of the defined types were placed in this encounter.   Patient Instructions  Medication Instructions:   Your physician recommends that you continue on your current medications as directed. Please refer to the Current Medication list given to you today.   *If you need a refill on your cardiac medications before your next appointment, please call your pharmacy*   Lab Work:  TODAY!!!! BMET/CBC  If you have labs (blood work) drawn today and your tests are completely normal, you will receive your results only by: MyChart Message (if you have MyChart) OR A paper copy in the mail If you have any lab test that is abnormal or we need to change your treatment, we will call you to review the results.   Testing/Procedures:  None ordered.   Follow-Up: At [facility], you and your health needs are our priority.  As part of our continuing mission to provide you with exceptional heart care, we have created designated Provider Care Teams.  These Care Teams include your primary Cardiologist (physician)  and Advanced Practice Providers (APPs -  Physician Assistants and Nurse Practitioners) who all work together to provide you with the care you need, when you need it.  We recommend signing up for the patient portal called "MyChart".  Sign up information is provided on this After Visit Summary.  MyChart is used to connect with patients for Virtual Visits (Telemedicine).  Patients are able to view lab/test results, encounter notes, upcoming appointments, etc.  Non-urgent messages can be sent to your provider as well.   To learn more about what you can do with MyChart, go to [email or web address].    Your next appointment:   5 month(s)  Provider:   None  or Eligha Bridegroom, NP           Signed, Levi Aland, NP  10/27/2022 12:09 PM    [facility]

## 2022-10-27 ENCOUNTER — Ambulatory Visit: Payer: Medicare Other | Attending: Nurse Practitioner | Admitting: Nurse Practitioner

## 2022-10-27 ENCOUNTER — Encounter: Payer: Self-pay | Admitting: Nurse Practitioner

## 2022-10-27 VITALS — BP 106/60 | HR 65 | Ht 70.0 in | Wt 127.0 lb

## 2022-10-27 DIAGNOSIS — Z7901 Long term (current) use of anticoagulants: Secondary | ICD-10-CM | POA: Diagnosis not present

## 2022-10-27 DIAGNOSIS — R636 Underweight: Secondary | ICD-10-CM | POA: Diagnosis not present

## 2022-10-27 DIAGNOSIS — I480 Paroxysmal atrial fibrillation: Secondary | ICD-10-CM

## 2022-10-27 NOTE — Patient Instructions (Signed)
Medication Instructions:   Your physician recommends that you continue on your current medications as directed. Please refer to the Current Medication list given to you today.   *If you need a refill on your cardiac medications before your next appointment, please call your pharmacy*   Lab Work:  TODAY!!!! BMET/CBC  If you have labs (blood work) drawn today and your tests are completely normal, you will receive your results only by: [place] (if you have MyChart) OR A paper copy in the mail If you have any lab test that is abnormal or we need to change your treatment, we will call you to review the results.   Testing/Procedures:  None ordered.   Follow-Up: At [facility], you and your health needs are our priority.  As part of our continuing mission to provide you with exceptional heart care, we have created designated Provider Care Teams.  These Care Teams include your primary Cardiologist (physician) and Advanced Practice Providers (APPs -  Physician Assistants and Nurse Practitioners) who all work together to provide you with the care you need, when you need it.  We recommend signing up for the patient portal called "MyChart".  Sign up information is provided on this After Visit Summary.  MyChart is used to connect with patients for Virtual Visits (Telemedicine).  Patients are able to view lab/test results, encounter notes, upcoming appointments, etc.  Non-urgent messages can be sent to your provider as well.   To learn more about what you can do with MyChart, go to [email or web address].    Your next appointment:   5 month(s)  Provider:   None  or Christen Bame, NP

## 2022-10-28 LAB — BASIC METABOLIC PANEL
BUN/Creatinine Ratio: 10 (ref 10–24)
BUN: 16 mg/dL (ref 8–27)
CO2: 26 mmol/L (ref 20–29)
Calcium: 9.1 mg/dL (ref 8.6–10.2)
Chloride: 105 mmol/L (ref 96–106)
Creatinine, Ser: 1.56 mg/dL — ABNORMAL HIGH (ref 0.76–1.27)
Glucose: 91 mg/dL (ref 70–99)
Potassium: 4.3 mmol/L (ref 3.5–5.2)
Sodium: 143 mmol/L (ref 134–144)
eGFR: 44 mL/min/{1.73_m2} — ABNORMAL LOW (ref >59)

## 2022-10-28 LAB — CBC
Hematocrit: 35.8 % — ABNORMAL LOW (ref 37.5–51.0)
Hemoglobin: 12.3 g/dL — ABNORMAL LOW (ref 13.0–17.7)
MCH: 30.2 pg (ref 26.6–33.0)
MCHC: 34.4 g/dL (ref 31.5–35.7)
MCV: 88 fL (ref 79–97)
Platelets: 213 10*3/uL (ref 150–450)
RBC: 4.07 x10E6/uL — ABNORMAL LOW (ref 4.14–5.80)
RDW: 13.1 % (ref 11.6–15.4)
WBC: 4.7 10*3/uL (ref 3.4–10.8)

## 2022-11-07 ENCOUNTER — Ambulatory Visit: Payer: Medicare Other | Admitting: Internal Medicine

## 2022-11-07 ENCOUNTER — Encounter: Payer: Self-pay | Admitting: Internal Medicine

## 2022-11-07 VITALS — BP 104/70 | HR 70 | Ht 70.0 in | Wt 130.8 lb

## 2022-11-07 DIAGNOSIS — E0590 Thyrotoxicosis, unspecified without thyrotoxic crisis or storm: Secondary | ICD-10-CM | POA: Diagnosis not present

## 2022-11-07 LAB — TSH: TSH: 103.26 u[IU]/mL — ABNORMAL HIGH (ref 0.35–5.50)

## 2022-11-07 LAB — T4, FREE: Free T4: 0.33 ng/dL — ABNORMAL LOW (ref 0.60–1.60)

## 2022-11-07 NOTE — Progress Notes (Unsigned)
Name: Brandon Taylor  MRN/ DOB: [id number], 10/16/1940    Age/ Sex: 82 y.o., male    PCP: Ladell Pier, MD   Reason for Endocrinology Evaluation: Hyperthyroidism     Date of Initial Endocrinology Evaluation: 11/07/2022     HPI: Brandon Taylor is a 82 y.o. male with a past medical history of A.Fib, GERD, Hx of ETOH abuse . The patient presented for initial endocrinology clinic visit on 11/07/2022 for consultative assistance with his Hyperthyroidism.   Pt has been noted with intermittently low TSH in 2014 at 0.191 uIU/mL and again in 06/2022 at 0.328 uIU/mL.   Pt was started on Methimazole 08/2022  Undetectable TSI 06/2022  Today  he is accompanied by his spouse  He has been noted with weight gain  Denies local neck swelling Denies constipation nor diarrhea  Denies palpitations  Denies tremors   Methimazole 10 mg BID  Of note, it was difficult to confirm if he is actually taking it or not or how much he is taking, spouse was not sure of the name of the medication   Sister with thyroid disease    HISTORY:  Past Medical History:  Past Medical History:  Diagnosis Date   ETOH abuse    GERD (gastroesophageal reflux disease)    Past Surgical History:  Past Surgical History:  Procedure Laterality Date   NO PAST SURGERIES      Social History:  reports that he quit smoking about 10 years ago. His smoking use included cigarettes. He has a 80.00 pack-year smoking history. He has never used smokeless tobacco. He reports that he does not currently use alcohol. He reports that he does not use drugs. Family History: family history includes COPD in his brother.   HOME MEDICATIONS: Allergies as of 11/07/2022   No Known Allergies      Medication List        Accurate as of November 07, 2022  1:15 PM. If you have any questions, ask your nurse or doctor.          STOP taking these medications    guaiFENesin 600 MG 12 hr tablet Commonly known as: MUCINEX Stopped by:  Dorita Sciara, MD       TAKE these medications    apixaban 2.5 MG Tabs tablet Commonly known as: ELIQUIS Take 1 tablet (2.5 mg total) by mouth 2 (two) times daily.   [facility] VITAMIN B12 1000 MCG tablet Generic drug: cyanocobalamin TAKE 1 TABLET BY MOUTH EVERY DAY What changed: how much to take   ferrous sulfate 325 (65 FE) MG tablet Take 1 tab PO four times a week   latanoprost 0.005 % ophthalmic solution Commonly known as: XALATAN Place 1 drop into both eyes at bedtime as needed.   methimazole 10 MG tablet Commonly known as: TAPAZOLE Take 1 tablet (10 mg total) by mouth 2 (two) times daily.   metoprolol tartrate 25 MG tablet Commonly known as: LOPRESSOR Take 1 tablet (25 mg total) by mouth 2 (two) times daily.   pantoprazole 40 MG tablet Commonly known as: PROTONIX TAKE 1 TABLET BY MOUTH EVERY DAY   pravastatin 20 MG tablet Commonly known as: PRAVACHOL Take 1 tablet (20 mg total) by mouth daily.   vitamin D3 25 MCG tablet Commonly known as: CHOLECALCIFEROL Take 1 tablet (1,000 Units total) by mouth daily.          REVIEW OF SYSTEMS: A comprehensive ROS was conducted with the patient and is  negative except as per HPI and below:  ROS     OBJECTIVE:  VS: BP 104/70 (BP Location: Left Arm, Patient Position: Sitting, Cuff Size: Small)   Pulse 70   Ht 5\' 10"  (1.778 m)   Wt 130 lb 12.8 oz (59.3 kg)   SpO2 96%   BMI 18.77 kg/m    Wt Readings from Last 3 Encounters:  11/07/22 130 lb 12.8 oz (59.3 kg)  10/27/22 127 lb (57.6 kg)  07/28/22 127 lb (57.6 kg)    EXAM: General: Pt appears well and is in NAD  Eyes: External eye exam normal without stare, lid lag or exophthalmos.  EOM intact.   Neck: General: Supple without adenopathy. Thyroid: Thyroid size normal.  No goiter or nodules appreciated.   Lungs: Clear with good BS bilat with no rales, rhonchi, or wheezes  Heart: Auscultation: RRR.  Abdomen:  soft, nontender  Extremities:  BL LE: No pretibial  edema normal ROM and strength.  Mental Status: Judgment, insight: Intact Orientation: Oriented to time, place, and person Mood and affect: No depression, anxiety, or agitation     DATA REVIEWED: ***    ASSESSMENT/PLAN/RECOMMENDATIONS:   Hyperthyroidism:  - Pt is clinically euthyroid at this time  - No local neck symptoms  - Discussed risk of worsening cardiac arrhythmia with uncontrolled hyperthyroidism  - We also discussed importance of taking medication as prescribed and confirming the intake of medications - TSI undetectable  - Will consider thyroid ultrasound in the future     Medications : Methimazole 10 mg BID   F/U in 4 months Labs in 6 weeks  Signed electronically by: Mack Guise, MD  [facility] Endocrinology  [facility] [address] [place], [place] [address] Phone: [phone] FAX: [phone]   CC: Ladell Pier, MD [address] [place] [address] Phone: [phone] Fax: [phone]   Return to Endocrinology clinic as below: Future Appointments  Date Time Provider [place]  11/28/2022  2:10 PM Ladell Pier, MD [facility] None  03/16/2023 11:20 AM Swinyer, Lanice Schwab, NP [facility]

## 2022-11-07 NOTE — Patient Instructions (Signed)
Methimazole 10 mg twice daily is what is on your medication list,Please go home and check medication bottles and let us know  when we contact you , if you have been taking it or not

## 2022-11-10 ENCOUNTER — Telehealth: Payer: Self-pay | Admitting: Internal Medicine

## 2022-11-10 DIAGNOSIS — E0590 Thyrotoxicosis, unspecified without thyrotoxic crisis or storm: Secondary | ICD-10-CM

## 2022-11-10 NOTE — Telephone Encounter (Signed)
Please let the pt know that his thyroid testing shows he is on TOO much methimazole    He needs to STOP Methimazole for now  but schedule him to return for labs  next Monday 2/5th and will determine the right dose than     Thanks

## 2022-11-10 NOTE — Telephone Encounter (Signed)
Patient wife has been advised and will come in on Monday at 10:30am for labs.

## 2022-11-17 ENCOUNTER — Other Ambulatory Visit (INDEPENDENT_AMBULATORY_CARE_PROVIDER_SITE_OTHER): Payer: Medicare Other

## 2022-11-17 ENCOUNTER — Other Ambulatory Visit: Payer: Self-pay

## 2022-11-17 ENCOUNTER — Telehealth: Payer: Self-pay | Admitting: Internal Medicine

## 2022-11-17 DIAGNOSIS — E0590 Thyrotoxicosis, unspecified without thyrotoxic crisis or storm: Secondary | ICD-10-CM

## 2022-11-17 LAB — TSH: TSH: 13.19 u[IU]/mL — ABNORMAL HIGH (ref 0.35–5.50)

## 2022-11-17 LAB — T4, FREE: Free T4: 0.74 ng/dL (ref 0.60–1.60)

## 2022-11-17 MED ORDER — APIXABAN 2.5 MG PO TABS
2.5000 mg | ORAL_TABLET | Freq: Two times a day (BID) | ORAL | 1 refills | Status: DC
  Filled 2022-11-17: qty 180, 90d supply, fill #0

## 2022-11-17 NOTE — Telephone Encounter (Signed)
Brandon Taylor,   Is this someone we could get signed up for patient assistance?

## 2022-11-17 NOTE — Telephone Encounter (Signed)
Pts wife is calling to see if Dr. Wynetta Emery could send in new script for generic apixaban (ELIQUIS) 2.5 MG TABS tablet [[id number]] . The medication is almost $50. Please advise [facility] [place]

## 2022-11-17 NOTE — Telephone Encounter (Signed)
Rx sent to our pharmacy.  

## 2022-11-17 NOTE — Addendum Note (Signed)
Addended by: Abbie Sons L on: 11/17/2022 03:53 PM   Modules accepted: Orders

## 2022-11-18 ENCOUNTER — Other Ambulatory Visit: Payer: Self-pay

## 2022-11-19 ENCOUNTER — Other Ambulatory Visit: Payer: Self-pay | Admitting: Internal Medicine

## 2022-11-19 ENCOUNTER — Telehealth: Payer: Self-pay | Admitting: Internal Medicine

## 2022-11-19 MED ORDER — FERROUS SULFATE 325 (65 FE) MG PO TABS: ORAL_TABLET | ORAL | 0 refills | Status: DC

## 2022-11-19 MED ORDER — METHIMAZOLE 5 MG PO TABS: 5.0000 mg | ORAL_TABLET | Freq: Every day | ORAL | 1 refills | Status: DC

## 2022-11-19 NOTE — Telephone Encounter (Signed)
Please let the patient know that his thyroid levels are improving, I would suggest restarting methimazole but at a smaller dose of 5 mg once daily   A new prescription has been sent  Please let the patient know to keep his next lab appointment   Thanks

## 2022-11-19 NOTE — Telephone Encounter (Signed)
Requested medication (s) are due for refill today: yes  Requested medication (s) are on the active medication list: yes  Last refill:  eliquis 11/17/22 #180/1, ferrous sulfate 08/07/22 #100/0  Future visit scheduled: yes  Notes to clinic:  pt is needing generic eliquis sent in since cheaper. Unable to refill per protocol due to failed labs, no updated results.    Requested Prescriptions  Pending Prescriptions Disp Refills   apixaban (ELIQUIS) 2.5 MG TABS tablet 180 tablet 1    Sig: Take 1 tablet (2.5 mg total) by mouth 2 (two) times daily.     Hematology:  Anticoagulants - apixaban Failed - 11/19/2022 11:39 AM      Failed - HGB in normal range and within 360 days    Hemoglobin  Date Value Ref Range Status  10/27/2022 12.3 (L) 13.0 - 17.7 g/dL Final         Failed - HCT in normal range and within 360 days    Hematocrit  Date Value Ref Range Status  10/27/2022 35.8 (L) 37.5 - 51.0 % Final         Failed - Cr in normal range and within 360 days    Creat  Date Value Ref Range Status  10/24/2014 1.19 0.50 - 1.35 mg/dL Final   Creatinine, Ser  Date Value Ref Range Status  10/27/2022 1.56 (H) 0.76 - 1.27 mg/dL Final         Passed - PLT in normal range and within 360 days    Platelets  Date Value Ref Range Status  10/27/2022 213 150 - 450 x10E3/uL Final         Passed - AST in normal range and within 360 days    AST  Date Value Ref Range Status  06/27/2022 28 15 - 41 U/L Final         Passed - ALT in normal range and within 360 days    ALT  Date Value Ref Range Status  06/27/2022 23 0 - 44 U/L Final         Passed - Valid encounter within last 12 months    Recent Outpatient Visits           3 months ago Hospital discharge follow-up   [place], [name], MD   4 months ago COVID-19 virus infection   [place], [name], MD   1 year ago Essential hypertension   [place], [name], MD   1 year ago Essential hypertension   [place], [place], [name]   1 year ago Encounter for [employer] annual wellness exam   [place], RPH-CPP       Future Appointments             In 1 week Ladell Pier, MD [place]   In 3 months Swinyer, Lanice Schwab, NP [place] [facility]             ferrous sulfate 325 (65 FE) MG tablet 100 tablet 0    Sig: Take 1 tab PO four times a week     Endocrinology:  Minerals - Iron Supplementation Failed - 11/19/2022 11:39 AM      Failed - HGB in normal range and within 360 days  Hemoglobin  Date Value Ref Range Status  10/27/2022 12.3 (L) 13.0 - 17.7 g/dL Final         Failed - HCT in normal range and within 360 days    Hematocrit  Date Value Ref Range Status  10/27/2022 35.8 (L) 37.5 - 51.0 % Final         Failed - RBC in normal range and within 360 days    RBC  Date Value Ref Range Status  10/27/2022 4.07 (L) 4.14 - 5.80 x10E6/uL Final  06/30/2022 3.94 (L) 4.22 - 5.81 MIL/uL Final         Failed - Ferritin in normal range and within 360 days    Ferritin  Date Value Ref Range Status  07/28/2022 465 (H) 30 - 400 ng/mL Final         Passed - Fe (serum) in normal range and within 360 days    Iron  Date Value Ref Range Status  07/28/2022 37 (L) 38 - 169 ug/dL Final   Iron Saturation  Date Value Ref Range Status  07/28/2022 16 15 - 55 % Final         Passed - Valid encounter within last 12 months    Recent Outpatient Visits           3 months ago Hospital discharge follow-up   [place], MD   4 months ago COVID-19 virus infection   [place], [name], MD   1 year ago Essential hypertension    [place], [name], MD   1 year ago Essential hypertension   [place], [place], [name]   1 year ago Encounter for [employer] annual wellness exam   [place], RPH-CPP       Future Appointments             In 1 week Ladell Pier, MD [place]   In 3 months Swinyer, Lanice Schwab, NP [place] at [facility], [place]

## 2022-11-19 NOTE — Telephone Encounter (Deleted)
f °

## 2022-11-19 NOTE — Telephone Encounter (Addendum)
Medication Refill - Medication: apixaban (ELIQUIS) 2.5 MG TABS tablet  ferrous sulfate 325 (65 FE) MG tablet    Pt states that she is needing the generic brand for  apixaban (ELIQUIS) 2.5 MG TABS tablet . Per pt the generic brand is more affordable.   Has the patient contacted their pharmacy? Yes.    Pharmacy is waiting on PCP to sign for prescription   Preferred Pharmacy (with phone number or street name):  [id number] [place] [place] Phone: [phone]  Fax: [phone]     Has the patient been seen for an appointment in the last year OR does the patient have an upcoming appointment? Yes.    Agent: Please be advised that RX refills may take up to 3 business days. We ask that you follow-up with your pharmacy.

## 2022-11-19 NOTE — Telephone Encounter (Signed)
Patient advised and pick up medication

## 2022-11-20 ENCOUNTER — Telehealth: Payer: Self-pay | Admitting: Internal Medicine

## 2022-11-20 MED ORDER — APIXABAN 2.5 MG PO TABS: 2.5000 mg | ORAL_TABLET | Freq: Two times a day (BID) | ORAL | 1 refills | Status: DC

## 2022-11-20 NOTE — Addendum Note (Signed)
Addended by: Erie Noe on: 11/20/2022 04:13 PM   Modules accepted: Orders

## 2022-11-20 NOTE — Telephone Encounter (Signed)
Pt spouse reports that the cost for apixaban (ELIQUIS) 2.5 MG TABS tablet is too expensive and she would like to ask that a cheaper medication be sent to  [id number] [place] [place] Phone: [phone]  Fax: [phone]

## 2022-11-21 ENCOUNTER — Other Ambulatory Visit: Payer: Self-pay

## 2022-11-21 NOTE — Telephone Encounter (Signed)
Call placed to patient unable to reach message left on VM.

## 2022-11-28 ENCOUNTER — Ambulatory Visit: Payer: Medicare Other | Attending: Internal Medicine | Admitting: Internal Medicine

## 2022-11-28 ENCOUNTER — Encounter: Payer: Self-pay | Admitting: Internal Medicine

## 2022-11-28 ENCOUNTER — Other Ambulatory Visit: Payer: Self-pay

## 2022-11-28 VITALS — BP 125/66 | HR 65 | Temp 97.7°F | Ht 70.0 in | Wt 132.0 lb

## 2022-11-28 DIAGNOSIS — I480 Paroxysmal atrial fibrillation: Secondary | ICD-10-CM

## 2022-11-28 DIAGNOSIS — F0280 Dementia in other diseases classified elsewhere without behavioral disturbance: Secondary | ICD-10-CM

## 2022-11-28 DIAGNOSIS — N1832 Chronic kidney disease, stage 3b: Secondary | ICD-10-CM | POA: Diagnosis not present

## 2022-11-28 DIAGNOSIS — D649 Anemia, unspecified: Secondary | ICD-10-CM

## 2022-11-28 DIAGNOSIS — E440 Moderate protein-calorie malnutrition: Secondary | ICD-10-CM | POA: Diagnosis not present

## 2022-11-28 DIAGNOSIS — E0590 Thyrotoxicosis, unspecified without thyrotoxic crisis or storm: Secondary | ICD-10-CM | POA: Diagnosis not present

## 2022-11-28 DIAGNOSIS — I10 Essential (primary) hypertension: Secondary | ICD-10-CM | POA: Diagnosis not present

## 2022-11-28 DIAGNOSIS — G309 Alzheimer's disease, unspecified: Secondary | ICD-10-CM | POA: Diagnosis not present

## 2022-11-28 MED ORDER — APIXABAN 5 MG PO TABS
ORAL_TABLET | Freq: Two times a day (BID) | ORAL | 0 refills | Status: DC
  Filled 2022-11-28 – 2022-12-01 (×2): qty 30, 30d supply, fill #0

## 2022-11-28 NOTE — Progress Notes (Signed)
Patient ID: Brandon Taylor, male    DOB: 1941-09-28  MRN: [id number]  CC: Hyperthyroidism (Hyperthyroidism & HTN f/u. [facility] [place]ok Eliquis due to financial issues./Already received flu vax. )   Subjective: Brandon Taylor is a 82 y.o. male who presents for chronic ds management.  Wife is with him His concerns today include:  Pt with hx of GERD, ETOH use disorder, vit B12 def, HL, HTN, mild dementia, CKD 3, anemia chronic ds.   Hyperthyroid:  Seeing Dr. Leonette Monarch.  TSH was elev.  Had him stop Tapazole for 1 wk then recheck level which came down from 103 to 13.19.  Tapazole restarted at 5 mg daily. No palpitations.  Feels cold all the time. Patient remains underweight but gained 5 lbs since last visit.  Good appetite.  PAF/HTN:  saw cardiology NP about a month ago.  He was continued on low-dose Eliquis 2.5 mg twice a day and metoprolol. Wife would like to know options of whether he would qualify for any patient assistance program as the Eliquis is expensive.    CKD 3b: We have been watching his kidney function.  GFR in October was 52.  GFR 1 month ago was 44 with creatinine of 1.56.  He is not on any NSAIDs.  Anemia:  taking iron once a day as prescribed.  Recent CBC showed hemoglobin had improved from 10.4/29.8 to 12.3/35.8.  Dementia: Both patient and his wife state that his memory is stable. Patient Active Problem List   Diagnosis Date Noted   Moderate protein-calorie malnutrition ([place]) 11/28/2022   PAF (paroxysmal atrial fibrillation) ([place]) 07/28/2022   Hyperthyroidism 07/28/2022   Atrial fibrillation with RVR ([place]) 06/28/2022   Pneumonia due to COVID-19 virus 06/28/2022   Stage 3b chronic kidney disease ([place]) 12/04/2020   Anemia, chronic disease 09/28/2019   Mixed hyperlipidemia 07/20/2017   Mild dementia ([place]) 05/29/2017   Surgical site reaction 12/06/2015   Routine general medical examination at a health care facility 10/24/2014   Vitamin B12 deficiency 10/03/2013   Loss of  weight 10/03/2013   Alcohol abuse 10/03/2013   Nicotine abuse 10/03/2013   Hypoglycemia 09/22/2013   General weakness 09/22/2013   GERD (gastroesophageal reflux disease)    ETOH abuse      Current Outpatient Medications on File Prior to Visit  Medication Sig Dispense Refill   apixaban (ELIQUIS) 2.5 MG TABS tablet Take 1 tablet (2.5 mg total) by mouth 2 (two) times daily. 180 tablet 1   [facility]S VITAMIN B12 1000 MCG tablet TAKE 1 TABLET BY MOUTH EVERY DAY (Patient taking differently: Take 1,000 mcg by mouth daily.) 100 tablet 0   ferrous sulfate 325 (65 FE) MG tablet Take 1 tab PO four times a week 16 tablet 0   latanoprost (XALATAN) 0.005 % ophthalmic solution Place 1 drop into both eyes at bedtime as needed.     methimazole (TAPAZOLE) 5 MG tablet Take 1 tablet (5 mg total) by mouth daily. 90 tablet 1   metoprolol tartrate (LOPRESSOR) 25 MG tablet Take 1 tablet (25 mg total) by mouth 2 (two) times daily. 180 tablet 1   pantoprazole (PROTONIX) 40 MG tablet TAKE 1 TABLET BY MOUTH EVERY DAY (Patient taking differently: Take 40 mg by mouth daily.) 30 tablet 0   pravastatin (PRAVACHOL) 20 MG tablet Take 1 tablet (20 mg total) by mouth daily. 90 tablet 1   vitamin D3 (CHOLECALCIFEROL) 25 MCG tablet Take 1 tablet (1,000 Units total) by mouth daily. 90 tablet 1   No  current facility-administered medications on file prior to visit.    No Known Allergies  Social History   Socioeconomic History   Marital status: Married    Spouse name: Not on file   Number of children: Not on file   Years of education: Not on file   Highest education level: Not on file  Occupational History   Not on file  Tobacco Use   Smoking status: Former    Packs/day: 2.00    Years: 40.00    Total pack years: 80.00    Types: Cigarettes    Quit date: 10/13/2012    Years since quitting: 10.1   Smokeless tobacco: Never  Substance and Sexual Activity   Alcohol use: Not Currently    Comment: fifth daily   Drug use: No    Sexual activity: Not on file  Other Topics Concern   Not on file  Social History Narrative   Pt lives in 1 story home with his wife   Has 2 adult daughters - live here in [place]. Attends church weekly.    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[employer] (?)   Social Determinants of [place] Strain: [place]  (12/31/2020)   Overall Financial Resource Strain (CARDIA)    Difficulty of Paying Living Expenses: Not very hard  Food Insecurity: No Food Insecurity (07/01/2022)   Hunger Vital Sign    Worried About Running Out of Food in the Last Year: Never true    [place] in the Last Year: Never true  Transportation Needs: No Transportation Needs (07/01/2022)   PRAPARE - Hydrologist (Medical): No    Lack of Transportation (Non-Medical): No  Physical Activity: Sufficiently Active (12/31/2020)   Exercise Vital Sign    Days of Exercise per Week: 7 days    Minutes of Exercise per Session: 30 min  Stress: No Stress Concern Present (12/31/2020)   [place]    Feeling of Stress : Not at all  Social Connections: [place] (12/31/2020)   Social Connection and Isolation Panel [NHANES]    Frequency of Communication with Friends and Family: Three times a week    Frequency of Social Gatherings with Friends and Family: Three times a week    Attends Religious Services: More than 4 times per year    Active Member of Clubs or Organizations: Yes    Attends Archivist Meetings: More than 4 times per year    Marital Status: Married  Human resources officer Violence: Not At Risk (06/28/2022)   Humiliation, Afraid, Rape, and Kick questionnaire    Fear of Current or Ex-Partner: No    Emotionally Abused: No    Physically Abused: No    Sexually Abused: No    Family History  Problem Relation Age of Onset   COPD Brother     Past  Surgical History:  Procedure Laterality Date   NO PAST SURGERIES      ROS: Review of Systems Negative except as stated above  PHYSICAL EXAM: BP 125/66 (BP Location: Left Arm, Patient Position: Sitting, Cuff Size: Small)   Pulse 65   Temp 97.7 F (36.5 C) (Oral)   Ht 5' 10"$  (1.778 m)   Wt 132 lb (59.9 kg)   SpO2 97%   BMI 18.94 kg/m   Wt Readings from Last 3 Encounters:  11/28/22 132 lb (59.9 kg)  11/07/22 130 lb 12.8 oz (59.3 kg)  10/27/22 127 lb (57.6 kg)    Physical Exam   General appearance -pleasant elderly male in NAD.  He appears underweight for height with temporal wasting. Neck - supple, no significant adenopathy Chest - clear to auscultation, no wheezes, rales or rhonchi, symmetric air entry Heart - normal rate, regular rhythm, normal S1, S2, no murmurs, rubs, clicks or gallops Extremities -no lower extremity edema.  Some clubbing of the fingernails. Patient unable to correctly state the day of the week, month or year.    Latest Ref Rng & Units 10/27/2022   12:45 PM 07/28/2022    3:51 PM 06/30/2022    9:26 AM  CMP  Glucose 70 - 99 mg/dL 91  83  148   BUN 8 - 27 mg/dL 16  18  43   Creatinine 0.76 - 1.27 mg/dL 1.56  1.35  1.97   Sodium 134 - 144 mmol/L 143  143  146   Potassium 3.5 - 5.2 mmol/L 4.3  5.2  4.4   Chloride 96 - 106 mmol/L 105  107  118   CO2 20 - 29 mmol/L 26  22  22   $ Calcium 8.6 - 10.2 mg/dL 9.1  8.6  8.2    Lipid Panel     Component Value Date/Time   CHOL 201 (H) 12/03/2020 1629   TRIG 72 12/03/2020 1629   HDL 83 12/03/2020 1629   CHOLHDL 2.4 12/03/2020 1629   CHOLHDL 2.5 01/02/2014 0947   VLDL 14 01/02/2014 0947   LDLCALC 105 (H) 12/03/2020 1629    CBC    Component Value Date/Time   WBC 4.7 10/27/2022 1245   WBC 9.8 06/30/2022 0926   RBC 4.07 (L) 10/27/2022 1245   RBC 3.94 (L) 06/30/2022 0926   HGB 12.3 (L) 10/27/2022 1245   HCT 35.8 (L) 10/27/2022 1245   PLT 213 10/27/2022 1245   MCV 88 10/27/2022 1245   MCH 30.2  10/27/2022 1245   MCH 29.2 06/30/2022 0926   MCHC 34.4 10/27/2022 1245   MCHC 32.8 06/30/2022 0926   RDW 13.1 10/27/2022 1245   LYMPHSABS 0.9 06/30/2022 0926   LYMPHSABS 1.2 05/02/2021 1140   MONOABS 0.8 06/30/2022 0926   EOSABS 0.0 06/30/2022 0926   EOSABS 0.0 05/02/2021 1140   BASOSABS 0.0 06/30/2022 0926   BASOSABS 0.0 05/02/2021 1140   Lab Results  Component Value Date   IRON 37 (L) 07/28/2022   TIBC 232 (L) 07/28/2022   FERRITIN 465 (H) 07/28/2022    ASSESSMENT AND PLAN:  1. Essential hypertension Continue metoprolol 25 mg twice a day  2. PAF (paroxysmal atrial fibrillation) ([place]) I sent a message to [facility] in our pharmacy who does the patient assistance program.  She will work with him and his wife in getting him signed up for patient assistance program for Eliquis.  3. Moderate protein-calorie malnutrition ([place]) Encouraged him to purchase some boost or Ensure shakes and drink a few a day to supplement meals.  4. Stage 3b chronic kidney disease ([place]) We will continue to monitor.  Advised to avoid NSAIDs.  5. Hyperthyroidism Followed by endocrinology.  He is on low-dose Tapazole.  6. Dementia due to Alzheimer's disease [facility]) Patient wife feels that he is stable  7. Chronic anemia Improved.  Continue iron supplement.    Patient was given the opportunity to ask questions.  Patient verbalized understanding of the plan and was able to repeat key elements of the plan.  This documentation was completed using Radio producer.  Any transcriptional errors are unintentional.  No orders of the defined types were placed in this encounter.    Requested Prescriptions   Signed Prescriptions Disp Refills   apixaban (ELIQUIS) 5 MG TABS tablet 30 tablet 0    Sig: 1/2 tab PO BID    Return in about 4 months (around 03/29/2023) for Give appt with CMA in 2 wks for [place] Visit.  Karle Plumber, MD, FACP

## 2022-11-28 NOTE — Telephone Encounter (Signed)
Call placed to patient unable to reach message left on VM.

## 2022-12-01 ENCOUNTER — Other Ambulatory Visit (HOSPITAL_BASED_OUTPATIENT_CLINIC_OR_DEPARTMENT_OTHER): Payer: Self-pay

## 2022-12-01 ENCOUNTER — Other Ambulatory Visit: Payer: Self-pay

## 2022-12-02 ENCOUNTER — Other Ambulatory Visit: Payer: Self-pay

## 2022-12-02 NOTE — Telephone Encounter (Signed)
Medication picked up today .

## 2022-12-03 ENCOUNTER — Other Ambulatory Visit: Payer: Self-pay

## 2022-12-12 ENCOUNTER — Ambulatory Visit: Payer: Medicare Other | Attending: Internal Medicine

## 2022-12-12 DIAGNOSIS — Z0000 Encounter for general adult medical examination without abnormal findings: Secondary | ICD-10-CM | POA: Diagnosis not present

## 2022-12-12 NOTE — Progress Notes (Signed)
Subjective:   Brandon Taylor is a 82 y.o. male who presents for Medicare Annual/Subsequent preventive examination.  Review of Systems    connected with Mr.Rosamilia on 12/12/22 at  1035 am by telephone and verified that I am speaking with the correct person using two identifiers. I discussed the limitations, risks, security and privacy concerns of performing an evaluation and management service by telephone and the availability of in person appointments. I also discussed with the patient that there may be a patient responsible charge related to this service. The patient expressed understanding and agreed to proceed.  Patient location:  Home  My Location: [facility]  Persons on the telephone call:  Myself (Perian Tedder) and Mr.Tedrick       Objective:    There were no vitals filed for this visit. There is no height or weight on file to calculate BMI.     12/12/2022   10:40 AM 06/28/2022    6:00 PM 01/06/2022    9:13 AM 12/31/2020   11:44 AM 05/06/2017    1:44 PM 02/17/2017    6:00 PM 02/17/2017   10:18 AM  Advanced Directives  Does Patient Have a Medical Advance Directive? No No No No No No No  Would patient like information on creating a medical advance directive? No - Patient declined No - Patient declined No - Patient declined No - Patient declined  No - Patient declined No - Patient declined    Current Medications (verified) Outpatient Encounter Medications as of 12/12/2022  Medication Sig   apixaban (ELIQUIS) 2.5 MG TABS tablet Take 1 tablet (2.5 mg total) by mouth 2 (two) times daily.   apixaban (ELIQUIS) 5 MG TABS tablet Take 0.5 tablet (2.5 mg total) by mouth 2 (two) times daily.   [facility] VITAMIN B12 1000 MCG tablet TAKE 1 TABLET BY MOUTH EVERY DAY (Patient taking differently: Take 1,000 mcg by mouth daily.)   ferrous sulfate 325 (65 FE) MG tablet Take 1 tab PO four times a week   latanoprost (XALATAN) 0.005 % ophthalmic solution Place 1 drop into both eyes at bedtime as  needed.   methimazole (TAPAZOLE) 5 MG tablet Take 1 tablet (5 mg total) by mouth daily.   metoprolol tartrate (LOPRESSOR) 25 MG tablet Take 1 tablet (25 mg total) by mouth 2 (two) times daily.   pantoprazole (PROTONIX) 40 MG tablet TAKE 1 TABLET BY MOUTH EVERY DAY (Patient taking differently: Take 40 mg by mouth daily.)   pravastatin (PRAVACHOL) 20 MG tablet Take 1 tablet (20 mg total) by mouth daily.   vitamin D3 (CHOLECALCIFEROL) 25 MCG tablet Take 1 tablet (1,000 Units total) by mouth daily.   No facility-administered encounter medications on file as of 12/12/2022.    Allergies (verified) Patient has no known allergies.   History: Past Medical History:  Diagnosis Date   ETOH abuse    GERD (gastroesophageal reflux disease)    Past Surgical History:  Procedure Laterality Date   NO PAST SURGERIES     Family History  Problem Relation Age of Onset   COPD Brother    Social History   Socioeconomic History   Marital status: Married    Spouse name: Not on file   Number of children: Not on file   Years of education: Not on file   Highest education level: Not on file  Occupational History   Not on file  Tobacco Use   Smoking status: Former    Packs/day: 2.00  Years: 40.00    Total pack years: 80.00    Types: Cigarettes    Quit date: 10/13/2012    Years since quitting: 10.1   Smokeless tobacco: Never  Substance and Sexual Activity   Alcohol use: Not Currently    Comment: fifth daily   Drug use: No   Sexual activity: Not on file  Other Topics Concern   Not on file  Social History Narrative   Pt lives in 1 story home with his wife   Has 2 adult daughters - live here in [place]. Attends church weekly.    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[employer] (?)   Social Determinants of [place] Strain: [place]  (12/12/2022)   Overall Financial Resource Strain (CARDIA)    Difficulty of Paying Living Expenses: Not  hard at all  Food Insecurity: No Food Insecurity (12/12/2022)   Hunger Vital Sign    Worried About Running Out of Food in the Last Year: Never true    [place] in the Last Year: Never true  Transportation Needs: No Transportation Needs (12/12/2022)   PRAPARE - Hydrologist (Medical): No    Lack of Transportation (Non-Medical): No  Physical Activity: Sufficiently Active (12/12/2022)   Exercise Vital Sign    Days of Exercise per Week: 7 days    Minutes of Exercise per Session: 30 min  Stress: No Stress Concern Present (12/12/2022)   [place]    Feeling of Stress : Not at all  Social Connections: [place] (12/31/2020)   Social Connection and Isolation Panel [NHANES]    Frequency of Communication with Friends and Family: Three times a week    Frequency of Social Gatherings with Friends and Family: Three times a week    Attends Religious Services: More than 4 times per year    Active Member of Clubs or Organizations: Yes    Attends Archivist Meetings: More than 4 times per year    Marital Status: Married    Tobacco Counseling Counseling given: Not Answered   Clinical Intake:     Pain : No/denies pain     Diabetes: No     Diabetic?no         Activities of Daily Living    12/12/2022   10:40 AM 06/28/2022    3:15 PM  In your present state of health, do you have any difficulty performing the following activities:  Hearing? 1 0  Vision? 0 0  Difficulty concentrating or making decisions? 0 0  Walking or climbing stairs? 0 1  Dressing or bathing? 0 1  Doing errands, shopping? 0 0  Preparing Food and eating ? N   Using the Toilet? N   In the past six months, have you accidently leaked urine? N   Do you have problems with loss of bowel control? N   Managing your Medications? N   Managing your Finances? N   Housekeeping or managing your Housekeeping? N      Patient Care Team: Ladell Pier, MD as PCP - General (Internal Medicine)  Indicate any recent Medical Services you may have received from other than [facility] providers in the past year (date may be approximate).     Assessment:   This is a routine wellness examination for Free.  Hearing/Vision screen No results found.  Dietary issues and exercise  activities discussed:     Goals Addressed   None   Depression Screen    11/28/2022    2:17 PM 07/28/2022    2:59 PM 07/28/2022    2:50 PM 01/06/2022    9:14 AM 10/10/2021   11:10 AM 05/02/2021   11:17 AM 02/27/2021   10:22 AM  PHQ 2/9 Scores  PHQ - 2 Score 0 0 0 0 0 0 0  PHQ- 9 Score 0 0 0  0      Fall Risk    12/12/2022   10:40 AM 11/28/2022    2:14 PM 07/28/2022    2:49 PM 01/06/2022    9:13 AM 10/10/2021   11:01 AM  [place] in the past year? 0 0 0 0 0  Number falls in past yr: 0 0 0 0   Injury with Fall? 0 0 0 0   Risk for fall due to : No Fall Risks No Fall Risks No Fall Risks      FALL RISK PREVENTION PERTAINING TO THE HOME:  Any stairs in or around the home? No  If so, are there any without handrails? No  Home free of loose throw rugs in walkways, pet beds, electrical cords, etc? No  Adequate lighting in your home to reduce risk of falls? Yes   ASSISTIVE DEVICES UTILIZED TO PREVENT FALLS:  Life alert? No  Use of a cane, walker or w/c? No  Grab bars in the bathroom? Yes  Shower chair or bench in shower? Yes  Elevated toilet seat or a handicapped toilet? No   TIMED UP AND GO:  Was the test performed? No .  Length of time to ambulate 10 feet:  sec.   Gait slow and steady without use of assistive device  Cognitive Function:    12/12/2022   10:42 AM 01/06/2022    9:17 AM 12/31/2020   11:46 AM 12/03/2020    4:28 PM 05/24/2018   10:00 AM  MMSE - Mini Mental State Exam  Orientation to time '4 2 1 1 '$ 0  Orientation to Place '5 4 5 5 3  '$ Registration 3 0 '3 3 3  '$ Attention/ Calculation 0 4 0 0 0   Recall '2 3 3 3 '$ 0  Language- name 2 objects '2 2 2 2 2  '$ Language- repeat '1 1 1 1 1  '$ Language- follow 3 step command '3 3 3 3 2  '$ Language- read & follow direction '1 1 1 1 '$ 0  Write a sentence 1 0 1 1 0  Copy design 1 0 0 0 0  Total score '23 20 20 20 11        '$ 12/12/2022   10:45 AM  6CIT Screen  What Year? 4 points  What month? 0 points  What time? 0 points    Immunizations Immunization History  Administered Date(s) Administered   Fluad Quad(high Dose 65+) 07/28/2022   Influenza, High Dose Seasonal PF 08/03/2018, 08/09/2019   Influenza,inj,Quad PF,6+ Mos 09/23/2013, 10/24/2014, 07/13/2017, 12/31/2020, 10/10/2021   Influenza-Unspecified 08/13/2019   Moderna Sars-Covid-2 Vaccination 11/25/2019, 12/26/2019   Pfizer Covid-19 Vaccine Bivalent Booster 39yr & up 07/28/2022   Pneumococcal Conjugate-13 12/06/2015, 07/13/2017   Pneumococcal Polysaccharide-23 09/23/2013   Tdap 09/20/2018   Zoster Recombinat (Shingrix) 08/03/2018, 11/09/2018    TDAP status: Up to date  Flu Vaccine status: Up to date  Pneumococcal vaccine status: Up to date  Covid-19 vaccine status: Information provided on how to obtain vaccines.   Qualifies for  Shingles Vaccine? Yes   Zostavax completed No   Shingrix Completed?: Yes  Screening Tests Health Maintenance  Topic Date Due   COVID-19 Vaccine (4 - 2023-24 season) 09/22/2022   Medicare Annual Wellness (AWV)  12/12/2023   DTaP/Tdap/Td (2 - Td or Tdap) 09/20/2028   Pneumonia Vaccine 58+ Years old  Completed   INFLUENZA VACCINE  Completed   Zoster Vaccines- Shingrix  Completed   HPV VACCINES  Aged Out    Health Maintenance  Health Maintenance Due  Topic Date Due   COVID-19 Vaccine (4 - 2023-24 season) 09/22/2022    Colorectal cancer screening: No longer required.   Lung Cancer Screening: (Low Dose CT Chest recommended if Age 58-80 years, 30 pack-year currently smoking OR have quit w/in 15years.) does not qualify.   Lung Cancer Screening  Referral: n/a  Additional Screening:  Hepatitis C Screening: does not qualify; Completed   Vision Screening: Recommended annual ophthalmology exams for early detection of glaucoma and other disorders of the eye. Is the patient up to date with their annual eye exam?  No  Who is the provider or what is the name of the office in which the patient attends annual eye exams? N/a If pt is not established with a provider, would they like to be referred to a provider to establish care?    .   Dental Screening: Recommended annual dental exams for proper oral hygiene  Community Resource Referral / Chronic Care Management: CRR required this visit?  No   CCM required this visit?  No      Plan:     I have personally reviewed and noted the following in the patient's chart:   Medical and social history Use of alcohol, tobacco or illicit drugs  Current medications and supplements including opioid prescriptions. Patient is not currently taking opioid prescriptions. Functional ability and status Nutritional status Physical activity Advanced directives List of other physicians Hospitalizations, surgeries, and ER visits in previous 12 months Vitals Screenings to include cognitive, depression, and falls Referrals and appointments  In addition, I have reviewed and discussed with patient certain preventive protocols, quality metrics, and best practice recommendations. A written personalized care plan for preventive services as well as general preventive health recommendations were provided to patient.     Lillie Columbia, [place]   12/12/2022   Nurse Notes:

## 2022-12-13 ENCOUNTER — Other Ambulatory Visit: Payer: Self-pay | Admitting: Internal Medicine

## 2022-12-15 NOTE — Telephone Encounter (Signed)
Requested Prescriptions  Pending Prescriptions Disp Refills   ferrous sulfate 325 (65 FE) MG tablet [Pharmacy Med Name: FERROUS SULFATE 325 MG TABLET] 48 tablet 1    Sig: TAKE 1 TABLET BY MOUTH FOUR TIMES A WEEK     Endocrinology:  Minerals - Iron Supplementation Failed - 12/13/2022  2:31 PM      Failed - HGB in normal range and within 360 days    Hemoglobin  Date Value Ref Range Status  10/27/2022 12.3 (L) 13.0 - 17.7 g/dL Final         Failed - HCT in normal range and within 360 days    Hematocrit  Date Value Ref Range Status  10/27/2022 35.8 (L) 37.5 - 51.0 % Final         Failed - RBC in normal range and within 360 days    RBC  Date Value Ref Range Status  10/27/2022 4.07 (L) 4.14 - 5.80 x10E6/uL Final  06/30/2022 3.94 (L) 4.22 - 5.81 MIL/uL Final         Failed - Ferritin in normal range and within 360 days    Ferritin  Date Value Ref Range Status  07/28/2022 465 (H) 30 - 400 ng/mL Final         Passed - Fe (serum) in normal range and within 360 days    Iron  Date Value Ref Range Status  07/28/2022 37 (L) 38 - 169 ug/dL Final   Iron Saturation  Date Value Ref Range Status  07/28/2022 16 15 - 55 % Final         Passed - Valid encounter within last 12 months    Recent Outpatient Visits           2 weeks ago Essential hypertension   [place], MD   4 months ago Hospital discharge follow-up   [place], MD   5 months ago COVID-19 virus infection   [place], [name], MD   1 year ago Essential hypertension   [place], [name], MD   1 year ago Essential hypertension   [place], [name]       Future Appointments             In 3 months Swinyer, Lanice Schwab, NP [place] [facility],  [place]   In 3 months Wynetta Emery, Dalbert Batman, MD [place]

## 2022-12-19 ENCOUNTER — Other Ambulatory Visit: Payer: Self-pay

## 2022-12-19 ENCOUNTER — Telehealth: Payer: Self-pay | Admitting: Internal Medicine

## 2022-12-19 ENCOUNTER — Other Ambulatory Visit (INDEPENDENT_AMBULATORY_CARE_PROVIDER_SITE_OTHER): Payer: Medicare Other

## 2022-12-19 DIAGNOSIS — E0590 Thyrotoxicosis, unspecified without thyrotoxic crisis or storm: Secondary | ICD-10-CM

## 2022-12-19 LAB — TSH: TSH: 25.39 u[IU]/mL — ABNORMAL HIGH (ref 0.35–5.50)

## 2022-12-19 LAB — T4, FREE: Free T4: 0.55 ng/dL — ABNORMAL LOW (ref 0.60–1.60)

## 2022-12-19 NOTE — Telephone Encounter (Signed)
Patient wife has been advised and appointment schedule for 4 weeks

## 2022-12-19 NOTE — Telephone Encounter (Signed)
Please let the patient know to stop methimazole altogether as the current dose is too much for him.   Please schedule him for a lab appointment in 4 weeks   Thanks

## 2022-12-29 ENCOUNTER — Other Ambulatory Visit: Payer: Self-pay

## 2022-12-31 ENCOUNTER — Other Ambulatory Visit: Payer: Self-pay

## 2023-01-02 ENCOUNTER — Other Ambulatory Visit: Payer: Self-pay

## 2023-01-12 ENCOUNTER — Other Ambulatory Visit: Payer: Self-pay | Admitting: Internal Medicine

## 2023-01-12 DIAGNOSIS — E785 Hyperlipidemia, unspecified: Secondary | ICD-10-CM

## 2023-01-13 NOTE — Telephone Encounter (Signed)
Requested medication (s) are due for refill today: yes  Requested medication (s) are on the active medication list: yes  Last refill:  07/28/22 #90 1 refills   Future visit scheduled: yes in 2 months   Notes to clinic:  protocol failed. Last labs 12/03/20. Class print. Do you want to refill Rx?     Requested Prescriptions  Pending Prescriptions Disp Refills   pravastatin (PRAVACHOL) 20 MG tablet [Pharmacy Med Name: PRAVASTATIN SODIUM 20 MG TAB] 90 tablet 1    Sig: TAKE 1 TABLET BY MOUTH EVERY DAY     Cardiovascular:  Antilipid - Statins Failed - 01/12/2023  4:41 PM      Failed - Lipid Panel in normal range within the last 12 months    Cholesterol, Total  Date Value Ref Range Status  12/03/2020 201 (H) 100 - 199 mg/dL Final   LDL Chol Calc (NIH)  Date Value Ref Range Status  12/03/2020 105 (H) 0 - 99 mg/dL Final   HDL  Date Value Ref Range Status  12/03/2020 83 >39 mg/dL Final   Triglycerides  Date Value Ref Range Status  12/03/2020 72 0 - 149 mg/dL Final         Passed - Patient is not pregnant      Passed - Valid encounter within last 12 months    Recent Outpatient Visits           1 month ago Essential hypertension   [place], MD   5 months ago Hospital discharge follow-up   [place], MD   6 months ago COVID-19 virus infection   [place], [name], MD   1 year ago Essential hypertension   [place], [name], MD   1 year ago Essential hypertension   [place], [name]       Future Appointments             In 2 months Swinyer, Lanice Schwab, NP [place] [facility] [employer], [name]   In 2 months Wynetta Emery, Dalbert Batman, MD [place]

## 2023-01-16 ENCOUNTER — Ambulatory Visit: Payer: Self-pay

## 2023-01-16 NOTE — Telephone Encounter (Signed)
Call cannot be completed  

## 2023-01-16 NOTE — Telephone Encounter (Signed)
Summary: Pt wife has concerns about Rx that shows as discontinued   Pt wife Nichloas Rametta requests a call back regarding the Rx for amLODipine. Pt wife concerned and questions why it shows as discontinued. Cb# [phone]      Call cannot be completed. Will try again.

## 2023-01-16 NOTE — Telephone Encounter (Signed)
Third attempt to reach patient/wife. No answer and message: Call can not be completed as dialed.

## 2023-01-16 NOTE — Telephone Encounter (Signed)
Call placed to patient unable to reach or leave message

## 2023-01-19 ENCOUNTER — Other Ambulatory Visit (INDEPENDENT_AMBULATORY_CARE_PROVIDER_SITE_OTHER): Payer: Medicare Other

## 2023-01-19 DIAGNOSIS — E0590 Thyrotoxicosis, unspecified without thyrotoxic crisis or storm: Secondary | ICD-10-CM

## 2023-01-19 LAB — TSH: TSH: 20.19 u[IU]/mL — ABNORMAL HIGH (ref 0.35–5.50)

## 2023-01-19 LAB — T4, FREE: Free T4: 0.65 ng/dL (ref 0.60–1.60)

## 2023-01-20 ENCOUNTER — Telehealth: Payer: Self-pay | Admitting: Internal Medicine

## 2023-01-20 ENCOUNTER — Ambulatory Visit: Payer: Self-pay | Admitting: *Deleted

## 2023-01-20 NOTE — Telephone Encounter (Signed)
Please let the patient know or his wife that his thyroid test is improving and he needs to make sure to stay OFF methimazole.  Please make sure to remind the patient of his appointment with me in June and we will recheck his thyroid then

## 2023-01-20 NOTE — Telephone Encounter (Signed)
Reason for Disposition  [1] Caller has URGENT medicine question about med that PCP or specialist prescribed AND [2] triager unable to answer question  Answer Assessment - Initial Assessment Questions 1. NAME of MEDICINE: "What medicine(s) are you calling about?"     I called to get amlodipine refilled and the pharmacy said he was no longer on it.    2. QUESTION: "What is your question?" (e.g., double dose of medicine, side effect)     Why is he no longer on it. 3. PRESCRIBER: "Who prescribed the medicine?" Reason: if prescribed by specialist, call should be referred to that group.     Dr. Laural Benes 4. SYMPTOMS: "Do you have any symptoms?" If Yes, ask: "What symptoms are you having?"  "How bad are the symptoms (e.g., mild, moderate, severe)     N/A 5. PREGNANCY:  "Is there any chance that you are pregnant?" "When was your last menstrual period?"     N/A  Protocols used: Medication Question Call-A-AH  Chief Complaint: Wife called in because the amlodipine was not at the pharmacy and the pharmacy told her he was no longer on amlodipine.  She is wanting to know why. Symptoms: N/A Frequency: N/A Pertinent Negatives: Patient denies N/A Disposition: [] ED /[] Urgent Care (no appt availability in office) / [] Appointment(In office/virtual)/ []  [facility]/ [] Home Care/ [] Refused Recommended Disposition /[] Portal Mobile Bus/ [x]  Follow-up with PCP Additional Notes: She is going to double check the rx bottle when she gets home and call [facility] back either today or tomorrow to see what the name of the medicine is on his BP medication. "I hope he didn't get it mixed up and has been taking my medicine".   "I'll call back today or tomorrow". I let her know please do call us back so we can be sure he is taking the correct medication.       I looked all through his chart and do not see where he is on amlodipine.   He is on metoprolol 25 mg which Dr. Laural Benes instructed him  to continue taking during his last office visit.   I let his wife know this but to check his medication and call us back.

## 2023-01-21 NOTE — Telephone Encounter (Signed)
Patient spouse has been advised and verbalized understanding

## 2023-02-11 ENCOUNTER — Ambulatory Visit: Payer: Self-pay | Admitting: *Deleted

## 2023-02-11 NOTE — Telephone Encounter (Signed)
Message from Elon Jester sent at 02/11/2023 10:01 AM EDT  Summary: dizziness   Spouse called stated patient is feeling dizzy and this has been going on for a couple of days.          Call History   Type Contact Phone/Fax User  02/11/2023 10:01 AM EDT Phone (Incoming) [place] (Emergency Contact) [phone] Elon Jester  02/11/2023 10:00 AM EDT Phone (Incoming) [place] (Emergency Contact) [phone] Elon Jester

## 2023-02-11 NOTE — Telephone Encounter (Signed)
Attempted to contact patient again on only # listed in chart [phone] to review sx of dizziness. Recording states call cannot be completed as dialed. Please advise.

## 2023-02-11 NOTE — Telephone Encounter (Signed)
Summary: dizziness   Spouse called stated patient is feeling dizzy and this has been going on for a couple of days.           2nd attempt to contact patient on [phone] to review dizziness. Recording states call can not be completed as dialed.

## 2023-02-11 NOTE — Telephone Encounter (Signed)
First attempt to return call to spouse Brandon Taylor (on [place]) at [phone].   Get a recording  "Your call cannot be completed at this time".   "Please try your call again later or check with the operator".   Will try again later.

## 2023-02-12 DIAGNOSIS — Z961 Presence of intraocular lens: Secondary | ICD-10-CM | POA: Diagnosis not present

## 2023-02-12 DIAGNOSIS — H401232 Low-tension glaucoma, bilateral, moderate stage: Secondary | ICD-10-CM | POA: Diagnosis not present

## 2023-02-18 ENCOUNTER — Other Ambulatory Visit: Payer: Self-pay | Admitting: Internal Medicine

## 2023-02-18 ENCOUNTER — Ambulatory Visit: Payer: Self-pay | Admitting: *Deleted

## 2023-02-18 NOTE — Telephone Encounter (Signed)
Requested Prescriptions  Pending Prescriptions Disp Refills   metoprolol tartrate (LOPRESSOR) 25 MG tablet [Pharmacy Med Name: METOPROLOL TARTRATE 25 MG TAB] 180 tablet 0    Sig: TAKE 1 TABLET BY MOUTH TWICE A DAY     Cardiovascular:  Beta Blockers Passed - 02/18/2023 12:21 AM      Passed - Last BP in normal range    BP Readings from Last 1 Encounters:  11/28/22 125/66         Passed - Last Heart Rate in normal range    Pulse Readings from Last 1 Encounters:  11/28/22 65         Passed - Valid encounter within last 6 months    Recent Outpatient Visits           2 months ago Essential hypertension   [place] [facility] Marcine Matar, MD   6 months ago Hospital discharge follow-up   [facility] Marcine Matar, MD   7 months ago COVID-19 virus infection   [place] [facility] Marcine Matar, MD   1 year ago Essential hypertension   [place] [facility] Marcine Matar, MD   1 year ago Essential hypertension   [place] [facility] [place], Marzella Schlein, [place]       Future Appointments             In 3 weeks Swinyer, Zachary George, NP [place] [facility], [facility]   In 1 month Laural Benes, Binnie Rail, MD [facility]             methimazole (TAPAZOLE) 10 MG tablet [Pharmacy Med Name: METHIMAZOLE 10 MG TABLET] 180 tablet     Sig: TAKE 1 TABLET BY MOUTH TWICE A DAY     Not Delegated - Endocrinology:  Hyperthyroid Agents Failed - 02/18/2023 12:21 AM      Failed - This refill cannot be delegated      Failed - TSH in normal range and within 180 days    TSH  Date Value Ref Range Status  01/19/2023 20.19 (H) 0.35 - 5.50 uIU/mL Final         Failed - T3 Total in normal range and within 180 days    T3, Total  Date Value Ref Range Status  06/29/2022 65 (L) 71 - 180 ng/dL Final     Comment:    (NOTE) Performed At: [facility] [place] [address] [place], [place] [id number] Jolene Schimke MD [id number]    T3, Free  Date Value Ref Range Status  07/28/2022 2.4 2.0 - 4.4 pg/mL Final         Passed - T4 free in normal range and within 180 days    T4, Total  Date Value Ref Range Status  05/06/2017 8.0 4.5 - 12.0 ug/dL Final   Free T4  Date Value Ref Range Status  01/19/2023 0.65 0.60 - 1.60 ng/dL Final    Comment:    Specimens from patients who are undergoing biotin therapy and /or ingesting biotin supplements may contain high levels of biotin.  The higher biotin concentration in these specimens interferes with this Free T4 assay.  Specimens that contain high levels  of biotin may cause false high results for this Free T4 assay.  Please interpret results in light of the total clinical presentation of the patient.  Passed - Valid encounter within last 6 months    Recent Outpatient Visits           2 months ago Essential hypertension   [place] [facility] Marcine Matar, MD   6 months ago Hospital discharge follow-up   [facility] Marcine Matar, MD   7 months ago COVID-19 virus infection   [facility] Marcine Matar, MD   1 year ago Essential hypertension   [place] [facility] Marcine Matar, MD   1 year ago Essential hypertension   [place] [facility] [place], Marzella [place]lein, [place]       Future Appointments             In 3 weeks Swinyer, Zachary George, NP  [facility], [facility]   In 1 month Laural Benes, Binnie Rail, MD [facility]

## 2023-02-18 NOTE — Telephone Encounter (Signed)
Requested medication (s) are due for refill today: no  Requested medication (s) are on the active medication list: no  Last refill:  08/27/22 ended 11/10/22  Future visit scheduled: yes  Notes to clinic:  Med not delegated to NT to refuse   Requested Prescriptions  Pending Prescriptions Disp Refills   methimazole (TAPAZOLE) 10 MG tablet [Pharmacy Med Name: METHIMAZOLE 10 MG TABLET] 180 tablet     Sig: TAKE 1 TABLET BY MOUTH TWICE A DAY     Not Delegated - Endocrinology:  Hyperthyroid Agents Failed - 02/18/2023 12:21 AM      Failed - This refill cannot be delegated      Failed - TSH in normal range and within 180 days    TSH  Date Value Ref Range Status  01/19/2023 20.19 (H) 0.35 - 5.50 uIU/mL Final         Failed - T3 Total in normal range and within 180 days    T3, Total  Date Value Ref Range Status  06/29/2022 65 (L) 71 - 180 ng/dL Final    Comment:    (NOTE) Performed At: [facility] [address] [place], [place] [id number] Jolene Schimke MD WU:[id number]    T3, Free  Date Value Ref Range Status  07/28/2022 2.4 2.0 - 4.4 pg/mL Final         Passed - T4 free in normal range and within 180 days    T4, Total  Date Value Ref Range Status  05/06/2017 8.0 4.5 - 12.0 ug/dL Final   Free T4  Date Value Ref Range Status  01/19/2023 0.65 0.60 - 1.60 ng/dL Final    Comment:    Specimens from patients who are undergoing biotin therapy and /or ingesting biotin supplements may contain high levels of biotin.  The higher biotin concentration in these specimens interferes with this Free T4 assay.  Specimens that contain high levels  of biotin may cause false high results for this Free T4 assay.  Please interpret results in light of the total clinical presentation of the patient.           Passed - Valid encounter within last 6 months    Recent Outpatient Visits           2 months ago Essential hypertension   [facility] Marcine Matar, MD   6 months ago Hospital discharge follow-up   [facility] Marcine Matar, MD   7 months ago COVID-19 virus infection   [facility] Marcine Matar, MD   1 year ago Essential hypertension   [place] [facility] Marcine Matar, MD   1 year ago Essential hypertension   [place] [facility] [place], Marzella Schlein, [place]       Future Appointments             In 3 weeks Swinyer, Zachary George, NP [facility]   In 1 month Laural Benes, Binnie Rail, MD [facility]            Signed Prescriptions Disp Refills   metoprolol tartrate (LOPRESSOR) 25 MG tablet 180 tablet 0    Sig: TAKE 1 TABLET BY MOUTH TWICE A DAY     Cardiovascular:  Beta Blockers Passed - 02/18/2023 12:21 AM      Passed - Last BP in  normal range    BP Readings from Last 1 Encounters:  11/28/22 125/66         Passed - Last Heart Rate in normal range    Pulse Readings from Last 1 Encounters:  11/28/22 65         Passed - Valid encounter within last 6 months    Recent Outpatient Visits           2 months ago Essential hypertension   [facility] Marcine Matar, MD   6 months ago Hospital discharge follow-up   [facility] Marcine Matar, MD   7 months ago COVID-19 virus infection   [facility] Marcine Matar, MD   1 year ago Essential hypertension   [facility] Marcine Matar, MD   1 year ago Essential hypertension   [place] [facility] [place], Marzella Schlein, [place]       Future Appointments             In 3 weeks Swinyer, Zachary George, NP [facility]   In 1 month Laural Benes, Binnie Rail, MD [facility]

## 2023-02-18 NOTE — Telephone Encounter (Signed)
  Chief Complaint: Dizziness Symptoms: Wife calling initially, states she was told to call back in 24hrs when reported dizziness. Not sure who spoke to.  Spoke to pt. States "Ate something bad. Probably didn't drink enough." Denies any symptoms presently. States has NOT been dizzy for a week now. Frequency:  2 weeks ago off and on, not dizzy for a week Pertinent Negatives: Patient denies dizziness presently Disposition: [] ED /[] Urgent Care (no appt availability in office) / [] Appointment(In office/virtual)/ []  [facility]/ [] Home Care/ [] Refused Recommended Disposition /[]  Mobile Bus/ [x]  Follow-up with PCP Additional Notes: Does not check BP at home.Advised to CB if dizziness reoccurs. Care advise provided, verbalizes understanding.  Appt needed? Please advise. Reason for Disposition  [1] MILD dizziness (e.g., walking normally) AND [2] has been evaluated by doctor (or NP/PA) for this  Answer Assessment - Initial Assessment Questions 1. DESCRIPTION: "Describe your dizziness."     None presently 2. LIGHTHEADED: "Do you feel lightheaded?" (e.g., somewhat faint, woozy, weak upon standing)     no 3. VERTIGO: "Do you feel like either you or the room is spinning or tilting?" (i.e. vertigo)     no 4. SEVERITY: "How bad is it?"  "Do you feel like you are going to faint?" "Can you stand and walk?"   - MILD: Feels slightly dizzy, but walking normally.   - MODERATE: Feels unsteady when walking, but not falling; interferes with normal activities (e.g., school, work).   - SEVERE: Unable to walk without falling, or requires assistance to walk without falling; feels like passing out now.      NA 5. ONSET:  "When did the dizziness begin?"     2 weeks ago, none for past week   8. CAUSE: "What do you think is causing the dizziness?"     "Maybe wasn't drinking enough"  10. OTHER SYMPTOMS: "Do you have any other symptoms?" (e.g., fever, chest pain, vomiting, diarrhea, bleeding)        "Ate something bad."  Protocols used: Dizziness - Lightheadedness-A-AH

## 2023-03-16 ENCOUNTER — Ambulatory Visit: Payer: Medicare Other | Admitting: Nurse Practitioner

## 2023-03-17 ENCOUNTER — Encounter: Payer: Self-pay | Admitting: Internal Medicine

## 2023-03-17 ENCOUNTER — Ambulatory Visit: Payer: Medicare Other | Admitting: Internal Medicine

## 2023-03-17 VITALS — BP 110/70 | HR 61 | Ht 70.0 in | Wt 127.2 lb

## 2023-03-17 DIAGNOSIS — E0590 Thyrotoxicosis, unspecified without thyrotoxic crisis or storm: Secondary | ICD-10-CM | POA: Diagnosis not present

## 2023-03-17 LAB — T4, FREE: Free T4: 0.94 ng/dL (ref 0.60–1.60)

## 2023-03-17 LAB — TSH: TSH: 3.29 u[IU]/mL (ref 0.35–5.50)

## 2023-03-17 NOTE — Progress Notes (Unsigned)
Name: Brandon Taylor  MRN/ DOB: [id number], 06-15-1941    Age/ Sex: 82 y.o., male    PCP: Marcine Matar, MD   Reason for Endocrinology Evaluation: Hyperthyroidism     Date of Initial Endocrinology Evaluation: 11/07/2022    HPI: Brandon Taylor is a 82 y.o. male with a past medical history of A.Fib, GERD, Hx of ETOH abuse . The patient presented for initial endocrinology clinic visit on 11/07/2022 for consultative assistance with his Hyperthyroidism.   Pt has been noted with intermittently low TSH in 2014 at 0.191 uIU/mL and again in 06/2022 at 0.328 uIU/mL.   Pt was started on Methimazole 08/2022  Undetectable TSI 06/2022   Sister with thyroid disease   Methimazole was discontinued by March 2024 due to hypothyroidism   SUBJECTIVE:    Today (03/17/23): Brandon Taylor is here for follow-up on hyperthyroidism.   My assistant has contacted the patient and spoke to his wife in April to confirm that he has been off methimazole.  Today, the patient is unable to confirm medication intake nor the wife Per patient, wife is in charge of his medication But he was supposed to be off methimazole in March 2024  Today  he is accompanied by his spouse  Weight continues to fluctuate Denies neck swelling Denies palpitations Denies constipation/diarrhea Denies tremors    HISTORY:  Past Medical History:  Past Medical History:  Diagnosis Date   ETOH abuse    GERD (gastroesophageal reflux disease)    Past Surgical History:  Past Surgical History:  Procedure Laterality Date   NO PAST SURGERIES      Social History:  reports that he quit smoking about 10 years ago. His smoking use included cigarettes. He has a 80.00 pack-year smoking history. He has never used smokeless tobacco. He reports that he does not currently use alcohol. He reports that he does not use drugs. Family History: family history includes COPD in his brother.   HOME MEDICATIONS: Allergies as of 03/17/2023   No Known  Allergies      Medication List        Accurate as of March 17, 2023 10:34 AM. If you have any questions, ask your nurse or doctor.          apixaban 2.5 MG Tabs tablet Commonly known as: ELIQUIS Take 1 tablet (2.5 mg total) by mouth 2 (two) times daily.   Eliquis 5 MG Tabs tablet Generic drug: apixaban Take 0.5 tablet (2.5 mg total) by mouth 2 (two) times daily.   [facility] VITAMIN B12 1000 MCG tablet Generic drug: cyanocobalamin TAKE 1 TABLET BY MOUTH EVERY DAY What changed: how much to take   ferrous sulfate 325 (65 FE) MG tablet TAKE 1 TABLET BY MOUTH FOUR TIMES A WEEK   latanoprost 0.005 % ophthalmic solution Commonly known as: XALATAN Place 1 drop into both eyes at bedtime as needed.   metoprolol tartrate 25 MG tablet Commonly known as: LOPRESSOR TAKE 1 TABLET BY MOUTH TWICE A DAY   pantoprazole 40 MG tablet Commonly known as: PROTONIX TAKE 1 TABLET BY MOUTH EVERY DAY   pravastatin 20 MG tablet Commonly known as: PRAVACHOL TAKE 1 TABLET BY MOUTH EVERY DAY   vitamin D3 25 MCG tablet Commonly known as: CHOLECALCIFEROL Take 1 tablet (1,000 Units total) by mouth daily.          REVIEW OF SYSTEMS: A comprehensive ROS was conducted with the patient and is negative except as per HPI  OBJECTIVE:  VS: BP 110/70 (BP Location: Left Arm, Patient Position: Sitting, Cuff Size: Small)   Pulse 61   Ht 5\' 10"  (1.778 m)   Wt 127 lb 3.2 oz (57.7 kg)   SpO2 99%   BMI 18.25 kg/m    Wt Readings from Last 3 Encounters:  11/28/22 132 lb (59.9 kg)  11/07/22 130 lb 12.8 oz (59.3 kg)  10/27/22 127 lb (57.6 kg)    EXAM: General: Pt appears well and is in NAD  Neck: General: Supple without adenopathy. Thyroid: Thyroid size normal.  No goiter or nodules appreciated.   Lungs: Clear with good BS bilat   Heart: Auscultation: RRR.  Abdomen:  soft, nontender  Extremities:  BL LE: No pretibial edema   Mental Status: Judgment, insight: Intact Orientation: Oriented to  time, place, and person Mood and affect: No depression, anxiety, or agitation     DATA REVIEWED:  Latest Reference Range & Units 03/17/23 11:06  TSH 0.35 - 5.50 uIU/mL 3.29  Triiodothyronine (T3) 76 - 181 ng/dL 83  Z6,XWRU(EAVWUJ) 8.11 - 1.60 ng/dL 9.14     ASSESSMENT/PLAN/RECOMMENDATIONS:   Hyperthyroidism:  -Clinical scenario more consistent with subacute thyroiditis  -He has been off methimazole since March 2024  - Pt is clinically euthyroid - No local neck symptoms  - TSI undetectable  -Repeat TFTs remain normal -Patient will remain off methimazole    F/U in 6 months     Signed electronically by: Lyndle Herrlich, MD  [facility] Endocrinology  [facility] [address] [place] [place], [place] [address] Phone: [phone] FAX: [phone]   CC: Marcine Matar, MD [address] [place] [address] [place] [address] Phone: [phone] Fax: [phone]   Return to Endocrinology clinic as below: Future Appointments  Date Time Provider Department Center  03/17/2023 11:10 AM Diallo Ponder, Konrad Dolores, MD [facility] None  04/06/2023  2:10 PM Marcine Matar, MD [facility] None  04/30/2023 11:45 AM Perlie Gold, PA-C [facility]

## 2023-03-17 NOTE — Patient Instructions (Signed)
The thyroid medicine is called methimazole, this was supposed to be discontinued in March, after his blood work today, we will determine if he needs it or not  Please let us know if you find out that he has still been taking it it when she get home

## 2023-03-18 ENCOUNTER — Encounter: Payer: Self-pay | Admitting: Internal Medicine

## 2023-03-18 LAB — T3: T3, Total: 83 ng/dL (ref 76–181)

## 2023-03-27 ENCOUNTER — Other Ambulatory Visit: Payer: Self-pay | Admitting: Internal Medicine

## 2023-04-06 ENCOUNTER — Ambulatory Visit: Payer: Medicare Other | Attending: Internal Medicine | Admitting: Internal Medicine

## 2023-04-06 ENCOUNTER — Encounter: Payer: Self-pay | Admitting: Internal Medicine

## 2023-04-06 VITALS — BP 162/76 | HR 62 | Temp 97.5°F | Ht 70.0 in | Wt 121.0 lb

## 2023-04-06 DIAGNOSIS — G309 Alzheimer's disease, unspecified: Secondary | ICD-10-CM

## 2023-04-06 DIAGNOSIS — N1832 Chronic kidney disease, stage 3b: Secondary | ICD-10-CM

## 2023-04-06 DIAGNOSIS — R634 Abnormal weight loss: Secondary | ICD-10-CM

## 2023-04-06 DIAGNOSIS — I10 Essential (primary) hypertension: Secondary | ICD-10-CM | POA: Diagnosis not present

## 2023-04-06 DIAGNOSIS — F0280 Dementia in other diseases classified elsewhere without behavioral disturbance: Secondary | ICD-10-CM

## 2023-04-06 DIAGNOSIS — E440 Moderate protein-calorie malnutrition: Secondary | ICD-10-CM | POA: Diagnosis not present

## 2023-04-06 DIAGNOSIS — I480 Paroxysmal atrial fibrillation: Secondary | ICD-10-CM

## 2023-04-06 DIAGNOSIS — K219 Gastro-esophageal reflux disease without esophagitis: Secondary | ICD-10-CM

## 2023-04-06 MED ORDER — PANTOPRAZOLE SODIUM 40 MG PO TBEC: 40.0000 mg | DELAYED_RELEASE_TABLET | Freq: Every day | ORAL | 3 refills | Status: DC

## 2023-04-06 NOTE — Progress Notes (Signed)
Patient ID: Brandon Taylor, male    DOB: 01/15/1941  MRN: [id number]  CC: Hypertension (HTN f/u.  Med refills. /Reports taking amlodipine - not listed in meds. )   Subjective: Brandon Taylor is a 82 y.o. male who presents for chronic d management.  Wife is with him His concerns today include:  history of A.Fib, GERD, Hx of ETOH abuse, subacute thyroiditis resolved and off medication, dementia, vitamin B12 deficiency, HL, HTN, CKD 3, ACD.   HL:  Taking the Pravachol PAF/HTN:  no bruising or bleeding on Eliquis.  He has not had any falls. No Palpitations or CP.  Blood pressure elevated today.  He thinks he is taken the metoprolol already for the morning but not sure.  I note that blood pressure on last visit with me and also on recent visit with Dr. Brooks Sailors on the fourth of this month were normal.  He limits salt in the foods. Been keeping an eye on his kidney function.  Last GFR was 44.  He has range between 30s to 50s. Dementia: Wife reports that his memory is no worse than before.  No issues of him wandering out of the house or forgetting to leave the stove or water on. GERD: Request refill on pantoprazole. I note that he has had some weight loss since last visit.  He is down 11 pounds.  Patient and wife reports that his appetite is good but some days they only eat 1 meal a day.  Wife states that they eat when they are hungry.  Denies any palpitations.  No chronic cough.  No blood in the stools or the urine.  No fever or night sweats.  No back pain.  Recent TSH was normal.  Dr. Brooks Sailors told him to stay off the Tapazole.  She reports that his thyroid issue last year was likely subacute thyroiditis.  Patient Active Problem List   Diagnosis Date Noted   Moderate protein-calorie malnutrition (HCC) 11/28/2022   PAF (paroxysmal atrial fibrillation) (HCC) 07/28/2022   Hyperthyroidism 07/28/2022   Atrial fibrillation with RVR (HCC) 06/28/2022   Pneumonia due to COVID-19 virus 06/28/2022    Stage 3b chronic kidney disease (HCC) 12/04/2020   Anemia, chronic disease 09/28/2019   Mixed hyperlipidemia 07/20/2017   Mild dementia (HCC) 05/29/2017   Surgical site reaction 12/06/2015   Routine general medical examination at a health care facility 10/24/2014   Vitamin B12 deficiency 10/03/2013   Loss of weight 10/03/2013   Alcohol abuse 10/03/2013   Nicotine abuse 10/03/2013   Hypoglycemia 09/22/2013   General weakness 09/22/2013   GERD (gastroesophageal reflux disease)    ETOH abuse      Current Outpatient Medications on File Prior to Visit  Medication Sig Dispense Refill   apixaban (ELIQUIS) 2.5 MG TABS tablet Take 1 tablet (2.5 mg total) by mouth 2 (two) times daily. 180 tablet 1   [facility]S VITAMIN B12 1000 MCG tablet TAKE 1 TABLET BY MOUTH EVERY DAY (Patient taking differently: Take 1,000 mcg by mouth daily.) 100 tablet 0   D-1000 EXTRA STRENGTH 25 MCG (1000 UT) tablet TAKE 1 TABLET BY MOUTH EVERY DAY 90 tablet 1   ferrous sulfate 325 (65 FE) MG tablet TAKE 1 TABLET BY MOUTH FOUR TIMES A WEEK 48 tablet 1   latanoprost (XALATAN) 0.005 % ophthalmic solution Place 1 drop into both eyes at bedtime as needed.     metoprolol tartrate (LOPRESSOR) 25 MG tablet TAKE 1 TABLET BY MOUTH TWICE A DAY 180 tablet  0   pantoprazole (PROTONIX) 40 MG tablet TAKE 1 TABLET BY MOUTH EVERY DAY (Patient taking differently: Take 40 mg by mouth daily.) 30 tablet 0   pravastatin (PRAVACHOL) 20 MG tablet TAKE 1 TABLET BY MOUTH EVERY DAY 90 tablet 2   apixaban (ELIQUIS) 5 MG TABS tablet Take 0.5 tablet (2.5 mg total) by mouth 2 (two) times daily. (Patient not taking: Reported on 04/06/2023) 30 tablet 0   No current facility-administered medications on file prior to visit.    No Known Allergies  Social History   Socioeconomic History   Marital status: Married    Spouse name: Not on file   Number of children: Not on file   Years of education: Not on file   Highest education level: Not on file   Occupational History   Not on file  Tobacco Use   Smoking status: Former    Packs/day: 2.00    Years: 40.00    Additional pack years: 0.00    Total pack years: 80.00    Types: Cigarettes    Quit date: 10/13/2012    Years since quitting: 10.4   Smokeless tobacco: Never  Substance and Sexual Activity   Alcohol use: Not Currently    Comment: fifth daily   Drug use: No   Sexual activity: Not on file  Other Topics Concern   Not on file  Social History Narrative   Pt lives in 1 story home with his wife   Has 2 adult daughters - live here in [place]. Attends church weekly.    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[employer] (?)   Social Determinants of Health   Financial Resource Strain: Low Risk  (12/12/2022)   Overall Financial Resource Strain (CARDIA)    Difficulty of Paying Living Expenses: Not hard at all  Food Insecurity: No Food Insecurity (12/12/2022)   Hunger Vital Sign    Worried About Running Out of Food in the Last Year: Never true    Ran Out of Food in the Last Year: Never true  Transportation Needs: No Transportation Needs (12/12/2022)   PRAPARE - Administrator, Civil Service (Medical): No    Lack of Transportation (Non-Medical): No  Physical Activity: Sufficiently Active (12/12/2022)   Exercise Vital Sign    Days of Exercise per Week: 7 days    Minutes of Exercise per Session: 30 min  Stress: No Stress Concern Present (12/12/2022)   [employer] of Occupational Health - Occupational Stress Questionnaire    Feeling of Stress : Not at all  Social Connections: Socially Integrated (12/31/2020)   Social Connection and Isolation Panel [NHANES]    Frequency of Communication with Friends and Family: Three times a week    Frequency of Social Gatherings with Friends and Family: Three times a week    Attends Religious Services: More than 4 times per year    Active Member of Clubs or Organizations: Yes    Attends Tax inspector Meetings: More than 4 times per year    Marital Status: Married  Catering manager Violence: Not At Risk (06/28/2022)   Humiliation, Afraid, Rape, and Kick questionnaire    Fear of Current or Ex-Partner: No    Emotionally Abused: No    Physically Abused: No    Sexually Abused: No    Family History  Problem Relation Age of Onset   COPD Brother     Past Surgical History:  Procedure Laterality  Date   NO PAST SURGERIES      ROS: Review of Systems Negative except as stated above  PHYSICAL EXAM: BP (!) 162/76 (BP Location: Left Arm, Patient Position: Sitting, Cuff Size: Normal)   Pulse 62   Temp (!) 97.5 F (36.4 C) (Oral)   Ht 5\' 10"  (1.778 m)   Wt 121 lb (54.9 kg)   SpO2 98%   BMI 17.36 kg/m   Wt Readings from Last 3 Encounters:  04/06/23 121 lb (54.9 kg)  03/17/23 127 lb 3.2 oz (57.7 kg)  11/28/22 132 lb (59.9 kg)  Repeat blood pressure short 160/90  Physical Exam   General appearance -elderly African-American male in NAD.  He appears underweight for height with temporal wasting Mental status -he knows the day of the week but does not know the month of the year.  He answers most questions appropriately. Chest - clear to auscultation, no wheezes, rales or rhonchi, symmetric air entry Heart - normal rate, regular rhythm, normal S1, S2, no murmurs, rubs, clicks or gallops Neurological -he ambulates unassisted. Extremities -no lower extremity edema.     Latest Ref Rng & Units 10/27/2022   12:45 PM 07/28/2022    3:51 PM 06/30/2022    9:26 AM  CMP  Glucose 70 - 99 mg/dL 91  83  098   BUN 8 - 27 mg/dL 16  18  43   Creatinine 0.76 - 1.27 mg/dL 1.19  1.47  8.29   Sodium 134 - 144 mmol/L 143  143  146   Potassium 3.5 - 5.2 mmol/L 4.3  5.2  4.4   Chloride 96 - 106 mmol/L 105  107  118   CO2 20 - 29 mmol/L 26  22  22    Calcium 8.6 - 10.2 mg/dL 9.1  8.6  8.2    Lipid Panel     Component Value Date/Time   CHOL 201 (H) 12/03/2020 1629   TRIG 72 12/03/2020  1629   HDL 83 12/03/2020 1629   CHOLHDL 2.4 12/03/2020 1629   CHOLHDL 2.5 01/02/2014 0947   VLDL 14 01/02/2014 0947   LDLCALC 105 (H) 12/03/2020 1629    CBC    Component Value Date/Time   WBC 4.7 10/27/2022 1245   WBC 9.8 06/30/2022 0926   RBC 4.07 (L) 10/27/2022 1245   RBC 3.94 (L) 06/30/2022 0926   HGB 12.3 (L) 10/27/2022 1245   HCT 35.8 (L) 10/27/2022 1245   PLT 213 10/27/2022 1245   MCV 88 10/27/2022 1245   MCH 30.2 10/27/2022 1245   MCH 29.2 06/30/2022 0926   MCHC 34.4 10/27/2022 1245   MCHC 32.8 06/30/2022 0926   RDW 13.1 10/27/2022 1245   LYMPHSABS 0.9 06/30/2022 0926   LYMPHSABS 1.2 05/02/2021 1140   MONOABS 0.8 06/30/2022 0926   EOSABS 0.0 06/30/2022 0926   EOSABS 0.0 05/02/2021 1140   BASOSABS 0.0 06/30/2022 0926   BASOSABS 0.0 05/02/2021 1140    ASSESSMENT AND PLAN: 1. Essential hypertension Not at goal.  I question whether he has taken his metoprolol as yet for today.  Blood pressure normal on last visit and on recent visit with the endocrinologist earlier this month.  He will continue the metoprolol twice a day. - CBC  2. PAF (paroxysmal atrial fibrillation) (HCC) Sounds to be in normal rhythm.  Continue metoprolol and Eliquis.  He has an upcoming appointment with cardiology next month.  3. Stage 3b chronic kidney disease (HCC) Continue to monitor.  He is not on NSAIDs. -  CBC - Comprehensive metabolic panel  4. Dementia due to Alzheimer's disease (HCC) Stable without behavioral issues.  5. Gastroesophageal reflux disease - pantoprazole (PROTONIX) 40 MG tablet; Take 1 tablet (40 mg total) by mouth daily.  Dispense: 30 tablet; Refill: 3  6. Unintended weight loss 7. Moderate protein-calorie malnutrition (HCC) Encouraged him to try to eat 3 meals a day.  Thyroid level has normalized.  He is up-to-date with colonoscopy and be on the age of where he would need another one.  We will check CBC and chemistry today.    Patient was given the opportunity to  ask questions.  Patient verbalized understanding of the plan and was able to repeat key elements of the plan.   This documentation was completed using Paediatric nurse.  Any transcriptional errors are unintentional.  No orders of the defined types were placed in this encounter.    Requested Prescriptions   Pending Prescriptions Disp Refills   pantoprazole (PROTONIX) 40 MG tablet 30 tablet 0    Sig: Take 1 tablet (40 mg total) by mouth daily.    No follow-ups on file.  [name], MD, FACP

## 2023-04-07 LAB — COMPREHENSIVE METABOLIC PANEL
ALT: 8 IU/L (ref 0–44)
AST: 19 IU/L (ref 0–40)
Albumin: 4.2 g/dL (ref 3.7–4.7)
Alkaline Phosphatase: 103 IU/L (ref 44–121)
BUN/Creatinine Ratio: 13 (ref 10–24)
BUN: 22 mg/dL (ref 8–27)
Bilirubin Total: 0.2 mg/dL (ref 0.0–1.2)
CO2: 23 mmol/L (ref 20–29)
Calcium: 9.2 mg/dL (ref 8.6–10.2)
Chloride: 103 mmol/L (ref 96–106)
Creatinine, Ser: 1.64 mg/dL — ABNORMAL HIGH (ref 0.76–1.27)
Globulin, Total: 3.2 g/dL (ref 1.5–4.5)
Glucose: 80 mg/dL (ref 70–99)
Potassium: 4.5 mmol/L (ref 3.5–5.2)
Sodium: 140 mmol/L (ref 134–144)
Total Protein: 7.4 g/dL (ref 6.0–8.5)
eGFR: 41 mL/min/{1.73_m2} — ABNORMAL LOW (ref >59)

## 2023-04-07 LAB — CBC
Hematocrit: 37.2 % — ABNORMAL LOW (ref 37.5–51.0)
Hemoglobin: 12.1 g/dL — ABNORMAL LOW (ref 13.0–17.7)
MCH: 28.7 pg (ref 26.6–33.0)
MCHC: 32.5 g/dL (ref 31.5–35.7)
MCV: 88 fL (ref 79–97)
Platelets: 235 10*3/uL (ref 150–450)
RBC: 4.22 x10E6/uL (ref 4.14–5.80)
RDW: 13 % (ref 11.6–15.4)
WBC: 6.1 10*3/uL (ref 3.4–10.8)

## 2023-04-29 NOTE — Progress Notes (Addendum)
Cardiology Office Note:   Date:  04/30/2023  ID:  Brandon Taylor, DOB [birth year], MRN [id number] PCP: Brandon Matar, MD  [facility] Providers Cardiologist:  None    History of Present Illness:   Brandon Taylor is a 82 y.o. male with history of paroxysmal atrial fibrillation with RVR, hyperthyroidism, hypertension, hyperlipidemia, GERD, vitamin B12 deficiency, CKD IIIb, dementia.  Patient was first seen by [facility] in October of 2023 following an admission with COVID-19 when patient was found to have afib with RVR. He was started on Cardizem, then transitioned to Metoprolol and Eliquis. TTE at the time of admission showed LVEF borderline reduced at 50-55% with mild to moderate mitral insufficiency. Patient was seen in clinic by Dr. Royann Taylor in October and was maintaining NSR at the time. Of note, labs around time of his admission showed TSH was incompletely suppressed at 0.328 and the T4 was elevated at 1.84. Thyroid-stimulating immunoglobulins were negative.  A heart monitor had previously been ordered during his admission. This resulted after his visit with Dr. Royann Taylor and showed no atrial fibrillation. There were occasional very brief episodes of nonsustained ectopic atrial tachycardia and very brief episodes of nonsustained VT.  No evidence of meaningful bradycardia.   Today patient denies chest pain, shortness of breath, lower extremity edema, fatigue, palpitations, melena, hematuria, hemoptysis, diaphoresis, weakness, presyncope, syncope, orthopnea, and PND. He has not felt any symptoms concerning for recurrence of afib.   Studies Reviewed:    EKG:   EKG Interpretation Date/Time:  Thursday April 30 2023 11:54:23 EDT Ventricular Rate:  57 PR Interval:  222 QRS Duration:  84 QT Interval:  420 QTC Calculation: 408 R Axis:   -19  Text Interpretation: Sinus bradycardia with 1st degree A-V block When compared with ECG of 29-Jun-2022 12:19, QRS voltage has increased Criteria for  Septal infarct are no longer Present Nonspecific T wave abnormality no longer evident in Anterior leads Confirmed by Brandon Taylor [phone]) on 04/30/2023 12:01:34 PM      Risk Assessment/Calculations:    CHA2DS2-VASc Score = 2   This indicates a 2.2% annual risk of stroke. The patient's score is based upon: CHF History: 0 HTN History: 0 Diabetes History: 0 Stroke History: 0 Vascular Disease History: 0 Age Score: 2 Gender Score: 0             Physical Exam:   VS:  BP 122/64   Pulse (!) 57   Ht 5\' 10"  (1.778 m)   Wt 122 lb 3.2 oz (55.4 kg)   SpO2 98%   BMI 17.53 kg/m    Wt Readings from Last 3 Encounters:  04/30/23 122 lb 3.2 oz (55.4 kg)  04/06/23 121 lb (54.9 kg)  03/17/23 127 lb 3.2 oz (57.7 kg)     Physical Exam Vitals reviewed.  Constitutional:      Appearance: Normal appearance.  HENT:     Head: Normocephalic.  Eyes:     Pupils: Pupils are equal, round, and reactive to light.  Cardiovascular:     Rate and Rhythm: Normal rate and regular rhythm.     Pulses: Normal pulses.     Heart sounds: Normal heart sounds. No murmur heard.    No friction rub. No gallop.  Pulmonary:     Effort: Pulmonary effort is normal.     Breath sounds: Normal breath sounds.  Abdominal:     General: Abdomen is flat.     Palpations: Abdomen is soft.  Musculoskeletal:  General: Normal range of motion.     Right lower leg: No edema.     Left lower leg: No edema.  Skin:    General: Skin is warm and dry.     Capillary Refill: Capillary refill takes less than 2 seconds.  Neurological:     General: No focal deficit present.     Mental Status: He is alert and oriented to person, place, and time.  Psychiatric:        Mood and Affect: Mood normal.        Behavior: Behavior normal.        Thought Content: Thought content normal.        Judgment: Judgment normal.      ASSESSMENT AND PLAN:    Paroxysmal atrial fibrillation Secondary hypercoagulable state  Patient with hx  afib with RVR in the setting of COVID-19. Had conversion to NSR and subsequent heart monitor without afib.  TTE at that time with low normal LVEF. Patient without any symptoms since to suggest recurrence of afib.  Continue Metoprolol Tartrate 25mg  BID Continue Eliquis 2.5mg  BID (reduced dose with age/weight). Will reach out to Dr. Royann Taylor to see if we could consider stopping Eliquis. (previously wrote "continue Eliquis and metoprolol through the end of the year and at follow-up visit, if no evidence of atrial fibrillation could consider discontinuing." )  Addendum: Patient discussed with Dr. Royann Taylor. Okay to stop Eliquis given that he has gone almost a full year without recurrence.   Hyperlipidemia  Patient has been on Pravastatin 20mg  since 2018 for hyperlipidemia. No recent lipid panel per my review of patient's chart. Per patient's spouse, PCP checks this and he has follow up next week. LDL goal <100.  Lab Results  Component Value Date   CHOL 201 (H) 12/03/2020   HDL 83 12/03/2020   LDLCALC 105 (H) 12/03/2020   TRIG 72 12/03/2020   CHOLHDL 2.4 12/03/2020    Thyroid disease  Patient with abnormal thyroid labs in the setting of COVID-19 has since had normalization. Continue follow up with PCP.          Signed, Brandon Gold, PA-C

## 2023-04-30 ENCOUNTER — Telehealth: Payer: Self-pay | Admitting: Cardiology

## 2023-04-30 ENCOUNTER — Encounter: Payer: Self-pay | Admitting: Cardiology

## 2023-04-30 ENCOUNTER — Ambulatory Visit: Payer: Medicare Other | Attending: Nurse Practitioner | Admitting: Cardiology

## 2023-04-30 VITALS — BP 122/64 | HR 57 | Ht 70.0 in | Wt 122.2 lb

## 2023-04-30 DIAGNOSIS — I4891 Unspecified atrial fibrillation: Secondary | ICD-10-CM | POA: Diagnosis not present

## 2023-04-30 NOTE — Progress Notes (Signed)
It has been almost a year without recurrence.  Go ahead and stop his Eliquis please.  Thanks, Clayburn Pert.

## 2023-04-30 NOTE — Addendum Note (Signed)
Addended by: Franchot Gallo on: 04/30/2023 04:24 PM   Modules accepted: Orders

## 2023-04-30 NOTE — Telephone Encounter (Signed)
   After discussing patient with Dr. Royann Shivers, patient is okay to stop Eliquis given that it has been almost a year since isolated episode of afib. Please see addendum to today's progress note.   Perlie Gold, PA-C

## 2023-04-30 NOTE — Patient Instructions (Signed)
Medication Instructions:  Your physician recommends that you continue on your current medications as directed. Please refer to the Current Medication list given to you today.  *If you need a refill on your cardiac medications before your next appointment, please call your pharmacy*  Follow-Up: At [facility], you and your health needs are our priority.  As part of our continuing mission to provide you with exceptional heart care, we have created designated Provider Care Teams.  These Care Teams include your primary Cardiologist (physician) and Advanced Practice Providers (APPs -  Physician Assistants and Nurse Practitioners) who all work together to provide you with the care you need, when you need it.  We recommend signing up for the patient portal called "MyChart".  Sign up information is provided on this After Visit Summary.  MyChart is used to connect with patients for Virtual Visits (Telemedicine).  Patients are able to view lab/test results, encounter notes, upcoming appointments, etc.  Non-urgent messages can be sent to your provider as well.   To learn more about what you can do with MyChart, go to [email or web address].    Your next appointment:   5 month(s)   Perlie Gold, PA-C

## 2023-04-30 NOTE — Telephone Encounter (Signed)
Spoke with patient and informed him of Perlie Gold, PA-C's message:  Please call patient and inform that he can stop Eliquis as per note attached to this telephone encounter/progress note today.      After discussing patient with Dr. Royann Shivers, patient is okay to stop Eliquis given that it has been almost a year since isolated episode of afib. Please see addendum to today's progress note.    Perlie Gold, PA-C     Patient verbalized understanding and expressed appreciation for call.  Eliquis removed from medication list.

## 2023-05-04 ENCOUNTER — Encounter: Payer: Self-pay | Admitting: Pharmacist

## 2023-05-04 ENCOUNTER — Ambulatory Visit: Payer: Medicare Other | Admitting: Pharmacist

## 2023-05-04 VITALS — BP 124/68 | HR 62

## 2023-05-04 DIAGNOSIS — I10 Essential (primary) hypertension: Secondary | ICD-10-CM | POA: Diagnosis not present

## 2023-05-04 NOTE — Progress Notes (Signed)
   S:     No chief complaint on file.  82 y.o. male who presents for hypertension evaluation, education, and management.  PMH is significant for history of A.Fib, GERD, Hx of ETOH abuse, subacute thyroiditis resolved and off medication, dementia, vitamin B12 deficiency, HL, HTN, CKD 3, ACD. Marland Kitchen  Patient was referred and last seen by Primary Care Provider, Dr. Laural Benes, on 04/06/2023.   At that visit with his PCP, BP was 162/76 mmHg. He tells me today he thinks this was because he had something rich in sodium to eat that morning. Of note, BP at Cardiologist visit 7/18 was at goal.   Today, patient arrives in good spirits and presents without assistance. Denies dizziness, headache, blurred vision, swelling.   Patient reports hypertension is longstanding.   Family/Social history:  Fhx: no pertinent positives.  Tobacco: former smoker Alcohol: none reported  Medication adherence reported. Patient has taken BP medications today.   Current antihypertensives include: metoprolol tartrate 25 mg BID  Reported home BP readings: none given  Patient reported dietary habits:  -Tries to limit sodium  -Does not drink excessive amounts of caffeine   Patient-reported exercise habits: none given   O:  Vitals:   05/04/23 1424  BP: 124/68  Pulse: 62   Last 3 Office BP readings: BP Readings from Last 3 Encounters:  05/04/23 124/68  04/30/23 122/64  04/06/23 (!) 162/76    BMET    Component Value Date/Time   NA 140 04/06/2023 1542   K 4.5 04/06/2023 1542   CL 103 04/06/2023 1542   CO2 23 04/06/2023 1542   GLUCOSE 80 04/06/2023 1542   GLUCOSE 148 (H) 06/30/2022 0926   BUN 22 04/06/2023 1542   CREATININE 1.64 (H) 04/06/2023 1542   CREATININE 1.19 10/24/2014 1114   CALCIUM 9.2 04/06/2023 1542   GFRNONAA 33 (L) 06/30/2022 0926   GFRNONAA 60 10/24/2014 1114   GFRAA 46 (L) 12/03/2020 1629   GFRAA 69 10/24/2014 1114    Renal function: CrCl cannot be calculated (Patient's most recent lab  result is older than the maximum 21 days allowed.).  Clinical ASCVD: No  The ASCVD Risk score (Arnett DK, et al., 2019) failed to calculate for the following reasons:   The 2019 ASCVD risk score is only valid for ages 20 to 67  Patient is participating in a Managed Medicaid Plan: no     A/P: Hypertension diagnosed currently at goal on current medications. BP goal < 130/80 mmHg. Medication adherence appears appropriate.  -Continued current medication regimen.  -Patient educated on purpose, proper use, and potential adverse effects of metoprolol.  -F/u labs ordered - none -Counseled on lifestyle modifications for blood pressure control including reduced dietary sodium, increased exercise, adequate sleep. -Encouraged patient to check BP at home and bring log of readings to next visit. Counseled on proper use of home BP cuff.   Results reviewed and written information provided.    Written patient instructions provided. Patient verbalized understanding of treatment plan.  Total time in face to face counseling 15 minutes.    Follow-up:  Pharmacist in 1 month or prn. Marland Kitchen PCP clinic visit in 08/06/2023.   Butch Penny, PharmD, Patsy Baltimore, CPP Clinical Pharmacist [facility] [phone]

## 2023-05-18 ENCOUNTER — Other Ambulatory Visit: Payer: Medicare Other

## 2023-05-20 ENCOUNTER — Other Ambulatory Visit: Payer: Self-pay | Admitting: Internal Medicine

## 2023-05-21 ENCOUNTER — Other Ambulatory Visit: Payer: Self-pay | Admitting: Internal Medicine

## 2023-07-09 ENCOUNTER — Other Ambulatory Visit: Payer: Self-pay | Admitting: Internal Medicine

## 2023-07-09 DIAGNOSIS — K219 Gastro-esophageal reflux disease without esophagitis: Secondary | ICD-10-CM

## 2023-07-09 NOTE — Telephone Encounter (Signed)
Reordered 04/06/23 #30 3 RF  Requested Prescriptions  Refused Prescriptions Disp Refills   pantoprazole (PROTONIX) 40 MG tablet [Pharmacy Med Name: PANTOPRAZOLE SOD DR 40 MG TAB] 90 tablet 1    Sig: TAKE 1 TABLET BY MOUTH EVERY DAY     Gastroenterology: Proton Pump Inhibitors Passed - 07/09/2023 12:15 AM      Passed - Valid encounter within last 12 months    Recent Outpatient Visits           2 months ago Essential hypertension   [facility] [place], Cornelius Moras, RPH-CPP   3 months ago Essential hypertension   [place] [facility] Marcine Matar, MD   7 months ago Essential hypertension   [place] [facility] Marcine Matar, MD   11 months ago Hospital discharge follow-up   [facility] Marcine Matar, MD   1 year ago COVID-19 virus infection   [place] [facility] Marcine Matar, MD       Future Appointments             In 4 weeks Marcine Matar, MD [facility]   In 2 months Perlie Gold, PA-C  [facility] at [facility]

## 2023-07-13 ENCOUNTER — Other Ambulatory Visit: Payer: Self-pay | Admitting: Internal Medicine

## 2023-07-14 NOTE — Telephone Encounter (Signed)
Requested Prescriptions  Pending Prescriptions Disp Refills   ferrous sulfate 325 (65 FE) MG tablet [Pharmacy Med Name: FERROUS SULFATE 325 MG TABLET] 48 tablet 0    Sig: TAKE 1 TABLET BY MOUTH 4 TIMES A WEEK     Endocrinology:  Minerals - Iron Supplementation Failed - 07/13/2023  2:56 PM      Failed - HGB in normal range and within 360 days    Hemoglobin  Date Value Ref Range Status  04/06/2023 12.1 (L) 13.0 - 17.7 g/dL Final         Failed - HCT in normal range and within 360 days    Hematocrit  Date Value Ref Range Status  04/06/2023 37.2 (L) 37.5 - 51.0 % Final         Failed - Ferritin in normal range and within 360 days    Ferritin  Date Value Ref Range Status  07/28/2022 465 (H) 30 - 400 ng/mL Final         Passed - RBC in normal range and within 360 days    RBC  Date Value Ref Range Status  04/06/2023 4.22 4.14 - 5.80 x10E6/uL Final  06/30/2022 3.94 (L) 4.22 - 5.81 MIL/uL Final         Passed - Fe (serum) in normal range and within 360 days    Iron  Date Value Ref Range Status  07/28/2022 37 (L) 38 - 169 ug/dL Final   Iron Saturation  Date Value Ref Range Status  07/28/2022 16 15 - 55 % Final         Passed - Valid encounter within last 12 months    Recent Outpatient Visits           2 months ago Essential hypertension   [facility] [place], Cornelius Moras, RPH-CPP   3 months ago Essential hypertension   [place] [facility] Marcine Matar, MD   7 months ago Essential hypertension   [place] [facility] Marcine Matar, MD   11 months ago Hospital discharge follow-up   [facility] Marcine Matar, MD   1 year ago COVID-19 virus infection   [place] [facility] Marcine Matar, MD       Future Appointments             In 3 weeks Marcine Matar, MD [facility]   In 2 months Perlie Gold, PA-C  [facility]

## 2023-07-31 ENCOUNTER — Ambulatory Visit: Payer: Medicare Other | Admitting: Internal Medicine

## 2023-07-31 ENCOUNTER — Encounter: Payer: Self-pay | Admitting: Internal Medicine

## 2023-07-31 VITALS — BP 136/80 | HR 64 | Ht 70.0 in | Wt 123.6 lb

## 2023-07-31 DIAGNOSIS — Z8639 Personal history of other endocrine, nutritional and metabolic disease: Secondary | ICD-10-CM | POA: Diagnosis not present

## 2023-07-31 DIAGNOSIS — Z23 Encounter for immunization: Secondary | ICD-10-CM

## 2023-07-31 NOTE — Progress Notes (Unsigned)
Name: Brandon Taylor  MRN/ DOB: [id number], 04-Mar-1941    Age/ Sex: 82 y.o., male    PCP: Marcine Matar, MD   Reason for Endocrinology Evaluation: Hyperthyroidism     Date of Initial Endocrinology Evaluation: 11/07/2022    HPI: [name] is a 82 y.o. male with a past medical history of A.Fib, GERD, Hx of ETOH abuse . The patient presented for initial endocrinology clinic visit on 11/07/2022 for consultative assistance with his Hyperthyroidism.   Pt has been noted with intermittently low TSH in 2014 at 0.191 uIU/mL and again in 06/2022 at 0.328 uIU/mL.   Pt was started on Methimazole 08/2022  Undetectable TSI 06/2022   Sister with thyroid disease   Methimazole was discontinued by March 2024 due to hypothyroidism   SUBJECTIVE:    Today (07/31/23): [name] is here for follow-up on hyperthyroidism.   He has been  off methimazole since  March 2024  Today  he is accompanied by his spouse  Weight is stable  Denies neck swelling Denies palpitations Denies constipation/diarrhea Denies tremors    HISTORY:  Past Medical History:  Past Medical History:  Diagnosis Date   ETOH abuse    GERD (gastroesophageal reflux disease)    Past Surgical History:  Past Surgical History:  Procedure Laterality Date   NO PAST SURGERIES      Social History:  reports that he quit smoking about 10 years ago. His smoking use included cigarettes. He started smoking about 50 years ago. He has a 80 pack-year smoking history. He has never used smokeless tobacco. He reports that he does not currently use alcohol. He reports that he does not use drugs. Family History: family history includes COPD in his brother.   HOME MEDICATIONS: Allergies as of 07/31/2023   No Known Allergies      Medication List        Accurate as of July 31, 2023  2:41 PM. If you have any questions, ask your nurse or doctor.          [employer] VITAMIN B12 1000 MCG tablet Generic drug:  cyanocobalamin TAKE 1 TABLET BY MOUTH EVERY DAY What changed: how much to take   D-1000 Extra Strength 25 MCG (1000 UT) tablet Generic drug: Cholecalciferol TAKE 1 TABLET BY MOUTH EVERY DAY   ferrous sulfate 325 (65 FE) MG tablet TAKE 1 TABLET BY MOUTH 4 TIMES A WEEK   latanoprost 0.005 % ophthalmic solution Commonly known as: XALATAN Place 1 drop into both eyes at bedtime as needed.   metoprolol tartrate 25 MG tablet Commonly known as: LOPRESSOR TAKE 1 TABLET BY MOUTH TWICE A DAY   pantoprazole 40 MG tablet Commonly known as: PROTONIX Take 1 tablet (40 mg total) by mouth daily.   pravastatin 20 MG tablet Commonly known as: PRAVACHOL TAKE 1 TABLET BY MOUTH EVERY DAY          REVIEW OF SYSTEMS: A comprehensive ROS was conducted with the patient and is negative except as per HPI     OBJECTIVE:  VS: BP 136/80 (BP Location: Left Arm, Patient Position: Sitting, Cuff Size: Small)   Pulse 64   Ht 5\' 10"  (1.778 m)   Wt 123 lb 9.6 oz (56.1 kg)   SpO2 95%   BMI 17.73 kg/m    Wt Readings from Last 3 Encounters:  07/31/23 123 lb 9.6 oz (56.1 kg)  04/30/23 122 lb 3.2 oz (55.4 kg)  04/06/23 121 lb (54.9 kg)  EXAM: General: Pt appears well and is in NAD  Neck: General: Supple without adenopathy. Thyroid: Thyroid size normal.  No goiter or nodules appreciated.   Lungs: Clear with good BS bilat   Heart: Auscultation: RRR.  Abdomen:  soft, nontender  Extremities:  BL LE: No pretibial edema   Mental Status: Judgment, insight: Intact Orientation: Oriented to time, place, and person Mood and affect: No depression, anxiety, or agitation     DATA REVIEWED:     Latest Reference Range & Units 07/31/23 15:00  TSH 0.40 - 4.50 mIU/L 1.19  T4,Free(Direct) 0.8 - 1.8 ng/dL 1.2     ASSESSMENT/PLAN/RECOMMENDATIONS:   Hyperthyroidism:  -Clinical scenario more consistent with subacute thyroiditis  -He has been off methimazole since March 2024  - Pt is clinically  euthyroid - No local neck symptoms  - TSI undetectable  -Repeat TFTs now normal    F/U in 1 year     Signed electronically by: Lyndle Herrlich, MD  [facility] Endocrinology  [facility] [address] [place] [place], [place] [address] Phone: [phone] FAX: [phone]   CC: Marcine Matar, MD [address] [place] [address] [place] [address] Phone: [phone] Fax: [phone]   Return to Endocrinology clinic as below: Future Appointments  Date Time Provider Department Center  08/06/2023  1:30 PM Marcine Matar, MD [facility] None  09/21/2023  1:55 PM Swinyer, Zachary George, NP [facility]

## 2023-08-01 LAB — T4, FREE: Free T4: 1.2 ng/dL (ref 0.8–1.8)

## 2023-08-01 LAB — TSH: TSH: 1.19 m[IU]/L (ref 0.40–4.50)

## 2023-08-03 ENCOUNTER — Encounter: Payer: Self-pay | Admitting: Internal Medicine

## 2023-08-06 ENCOUNTER — Encounter: Payer: Self-pay | Admitting: Internal Medicine

## 2023-08-06 ENCOUNTER — Ambulatory Visit: Payer: Medicare Other | Attending: Internal Medicine | Admitting: Internal Medicine

## 2023-08-06 VITALS — BP 169/80 | HR 64 | Temp 97.9°F | Ht 70.0 in | Wt 121.0 lb

## 2023-08-06 DIAGNOSIS — R490 Dysphonia: Secondary | ICD-10-CM | POA: Diagnosis not present

## 2023-08-06 DIAGNOSIS — I10 Essential (primary) hypertension: Secondary | ICD-10-CM

## 2023-08-06 DIAGNOSIS — R053 Chronic cough: Secondary | ICD-10-CM

## 2023-08-06 DIAGNOSIS — I480 Paroxysmal atrial fibrillation: Secondary | ICD-10-CM

## 2023-08-06 DIAGNOSIS — E785 Hyperlipidemia, unspecified: Secondary | ICD-10-CM | POA: Diagnosis not present

## 2023-08-06 DIAGNOSIS — N1832 Chronic kidney disease, stage 3b: Secondary | ICD-10-CM | POA: Diagnosis not present

## 2023-08-06 DIAGNOSIS — R636 Underweight: Secondary | ICD-10-CM | POA: Diagnosis not present

## 2023-08-06 MED ORDER — ALBUTEROL SULFATE HFA 108 (90 BASE) MCG/ACT IN AERS
2.0000 | INHALATION_SPRAY | Freq: Four times a day (QID) | RESPIRATORY_TRACT | 1 refills | Status: DC | PRN
Start: 2023-08-06 — End: 2023-09-24

## 2023-08-06 NOTE — Patient Instructions (Signed)
Please remember to take your blood pressure medication today when you return home. I have referred you to pulmonary specialist for chronic cough.  They will call you to schedule the appointment.  Remember to go to [facility] imaging on [address] to have x-ray done of the chest. I have sent a prescription to the pharmacy for the albuterol inhaler. Let me know if you change your mind about being seen by the ear nose and throat specialist for the hoarseness.Brandon Taylor

## 2023-08-06 NOTE — Progress Notes (Signed)
Patient ID: Brandon ROSHTO, male    DOB: Mar 02, 1941  MRN: [id number]  CC: Hypertension (HTN f/u. /On going cough X4-5 mo /Flu vax already received)   Subjective: Brandon Taylor is a 82 y.o. male who presents for chronic ds management.  Wife is with him. His concerns today include:  history of A.Fib, GERD, Hx of ETOH abuse, subacute thyroiditis resolved and off medication, dementia, vitamin B12 deficiency, HL, HTN, CKD 3, ACD.   HTN/PAF: Since last visit with me, he saw the cardiologist in July.  Eliquis was discontinued. No palpitations. He was continued on metoprolol 25 mg twice a day.  He has not taken morning dose of his medication as yet today.  He forgot to take it because he was rushing to get ready for this appointment.  Wife states that he has a medication box. -blood pressure was elevated with me on last visit.  I had him follow-up with the clinical pharmacist several weeks later and blood pressure on that visit was good.  Saw [facility] 6 days ago and BP was 136/80.  No CP/SOB HL: Still taking Pravachol 20 mg daily as prescribed. CKD 3b/anemia: anemia is stable with H/H around 12 the last 2 times it was checked.  GFR recently has been in the 40s.  Weight has remained stable since last visit. Reports he is eating ok.  Underweight for height.  Reports cough x4-5 mths.  Sometimes dry and other times production Mainly at nights Occasional drainage at back of throat No heartburn.  Taking Pantoprazole Reports hoarseness of the voice.  His voice has always been raspy since he has been seeing me.  Wife thinks it is a little worse. Does not smoke   Patient Active Problem List   Diagnosis Date Noted   History of hyperthyroidism 07/31/2023   Moderate protein-calorie malnutrition (HCC) 11/28/2022   PAF (paroxysmal atrial fibrillation) (HCC) 07/28/2022   Hyperthyroidism 07/28/2022   Atrial fibrillation with RVR (HCC) 06/28/2022   Pneumonia due to COVID-19 virus 06/28/2022   Stage 3b  chronic kidney disease (HCC) 12/04/2020   Anemia, chronic disease 09/28/2019   Mixed hyperlipidemia 07/20/2017   Mild dementia (HCC) 05/29/2017   Surgical site reaction 12/06/2015   Routine general medical examination at a health care facility 10/24/2014   Vitamin B12 deficiency 10/03/2013   Loss of weight 10/03/2013   Alcohol abuse 10/03/2013   Nicotine abuse 10/03/2013   Hypoglycemia 09/22/2013   General weakness 09/22/2013   GERD (gastroesophageal reflux disease)    ETOH abuse      Current Outpatient Medications on File Prior to Visit  Medication Sig Dispense Refill   [facility] VITAMIN B12 1000 MCG tablet TAKE 1 TABLET BY MOUTH EVERY DAY (Patient taking differently: Take 1,000 mcg by mouth daily.) 100 tablet 0   D-1000 EXTRA STRENGTH 25 MCG (1000 UT) tablet TAKE 1 TABLET BY MOUTH EVERY DAY 90 tablet 1   ferrous sulfate 325 (65 FE) MG tablet TAKE 1 TABLET BY MOUTH 4 TIMES A WEEK 48 tablet 0   latanoprost (XALATAN) 0.005 % ophthalmic solution Place 1 drop into both eyes at bedtime as needed.     metoprolol tartrate (LOPRESSOR) 25 MG tablet TAKE 1 TABLET BY MOUTH TWICE A DAY 180 tablet 0   pantoprazole (PROTONIX) 40 MG tablet Take 1 tablet (40 mg total) by mouth daily. 30 tablet 3   pravastatin (PRAVACHOL) 20 MG tablet TAKE 1 TABLET BY MOUTH EVERY DAY 90 tablet 2   No current facility-administered medications  on file prior to visit.    No Known Allergies  Social History   Socioeconomic History   Marital status: Married    Spouse name: Not on file   Number of children: Not on file   Years of education: Not on file   Highest education level: Not on file  Occupational History   Not on file  Tobacco Use   Smoking status: Former    Current packs/day: 0.00    Average packs/day: 2.0 packs/day for 40.0 years (80.0 ttl pk-yrs)    Types: Cigarettes    Start date: 10/13/1972    Quit date: 10/13/2012    Years since quitting: 10.8   Smokeless tobacco: Never  Substance and Sexual Activity    Alcohol use: Not Currently    Comment: fifth daily   Drug use: No   Sexual activity: Not on file  Other Topics Concern   Not on file  Social History Narrative   Pt lives in 1 story home with his wife   Has 2 adult daughters - live here in [place]. Attends church weekly.    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[employer] (?)   Social Determinants of Health   Financial Resource Strain: Medium Risk (08/06/2023)   Overall Financial Resource Strain (CARDIA)    Difficulty of Paying Living Expenses: Somewhat hard  Food Insecurity: No Food Insecurity (08/06/2023)   Hunger Vital Sign    Worried About Running Out of Food in the Last Year: Never true    Ran Out of Food in the Last Year: Never true  Transportation Needs: No Transportation Needs (08/06/2023)   PRAPARE - Administrator, Civil Service (Medical): No    Lack of Transportation (Non-Medical): No  Physical Activity: Sufficiently Active (08/06/2023)   Exercise Vital Sign    Days of Exercise per Week: 5 days    Minutes of Exercise per Session: 60 min  Stress: No Stress Concern Present (08/06/2023)   [employer] of Occupational Health - Occupational Stress Questionnaire    Feeling of Stress : Not at all  Social Connections: Moderately Integrated (08/06/2023)   Social Connection and Isolation Panel [NHANES]    Frequency of Communication with Friends and Family: Twice a week    Frequency of Social Gatherings with Friends and Family: More than three times a week    Attends Religious Services: Never    Database administrator or Organizations: Yes    Attends Banker Meetings: Never    Marital Status: Married  Catering manager Violence: Not At Risk (08/06/2023)   Humiliation, Afraid, Rape, and Kick questionnaire    Fear of Current or Ex-Partner: No    Emotionally Abused: No    Physically Abused: No    Sexually Abused: No    Family History  Problem  Relation Age of Onset   COPD Brother     Past Surgical History:  Procedure Laterality Date   NO PAST SURGERIES      ROS: Review of Systems Negative except as stated above  PHYSICAL EXAM: BP (!) 169/80 (BP Location: Left Arm, Patient Position: Sitting, Cuff Size: Normal)   Pulse 64   Temp 97.9 F (36.6 C) (Oral)   Ht 5\' 10"  (1.778 m)   Wt 121 lb (54.9 kg)   SpO2 100%   BMI 17.36 kg/m   Wt Readings from Last 3 Encounters:  08/06/23 121 lb (54.9 kg)  07/31/23 123 lb 9.6  oz (56.1 kg)  04/30/23 122 lb 3.2 oz (55.4 kg)    Physical Exam Repeat BP 170/70  General appearance -elderly male who appears underweight for height.  He is in NAD.  He has a raspy voice Mental status -patient answers questions appropriately. Neck - supple, no significant adenopathy Chest -breath sounds decreased but equal.  No wheezes or crackles heard. Heart - normal rate, regular rhythm, normal S1, S2, no murmurs, rubs, clicks or gallops Extremities - no LE edema. No cyanosis     Latest Ref Rng & Units 04/06/2023    3:42 PM 10/27/2022   12:45 PM 07/28/2022    3:51 PM  CMP  Glucose 70 - 99 mg/dL 80  91  83   BUN 8 - 27 mg/dL 22  16  18    Creatinine 0.76 - 1.27 mg/dL 8.65  7.84  6.96   Sodium 134 - 144 mmol/L 140  143  143   Potassium 3.5 - 5.2 mmol/L 4.5  4.3  5.2   Chloride 96 - 106 mmol/L 103  105  107   CO2 20 - 29 mmol/L 23  26  22    Calcium 8.6 - 10.2 mg/dL 9.2  9.1  8.6   Total Protein 6.0 - 8.5 g/dL 7.4     Total Bilirubin 0.0 - 1.2 mg/dL 0.2     Alkaline Phos 44 - 121 IU/L 103     AST 0 - 40 IU/L 19     ALT 0 - 44 IU/L 8      Lipid Panel     Component Value Date/Time   CHOL 201 (H) 12/03/2020 1629   TRIG 72 12/03/2020 1629   HDL 83 12/03/2020 1629   CHOLHDL 2.4 12/03/2020 1629   CHOLHDL 2.5 01/02/2014 0947   VLDL 14 01/02/2014 0947   LDLCALC 105 (H) 12/03/2020 1629    CBC    Component Value Date/Time   WBC 6.1 04/06/2023 1542   WBC 9.8 06/30/2022 0926   RBC 4.22  04/06/2023 1542   RBC 3.94 (L) 06/30/2022 0926   HGB 12.1 (L) 04/06/2023 1542   HCT 37.2 (L) 04/06/2023 1542   PLT 235 04/06/2023 1542   MCV 88 04/06/2023 1542   MCH 28.7 04/06/2023 1542   MCH 29.2 06/30/2022 0926   MCHC 32.5 04/06/2023 1542   MCHC 32.8 06/30/2022 0926   RDW 13.0 04/06/2023 1542   LYMPHSABS 0.9 06/30/2022 0926   LYMPHSABS 1.2 05/02/2021 1140   MONOABS 0.8 06/30/2022 0926   EOSABS 0.0 06/30/2022 0926   EOSABS 0.0 05/02/2021 1140   BASOSABS 0.0 06/30/2022 0926   BASOSABS 0.0 05/02/2021 1140    ASSESSMENT AND PLAN: 1. Essential hypertension Not at goal.  He forgot to take his morning dose of metoprolol.  His wife will remind him to take it when he gets home.  Continue metoprolol 25 mg twice a day.  2. Chronic cough Of questionable etiology.  He is on Protonix for acid reflux and he feels this is under good control.  Does not endorse symptoms of significant postnasal drip.  We will give a trial of albuterol inhaler and get a chest x-ray.  Will refer to pulmonary. - albuterol (VENTOLIN HFA) 108 (90 Base) MCG/ACT inhaler; Inhale 2 puffs into the lungs every 6 (six) hours as needed for wheezing (AS needed for wheezing/cough).  Dispense: 8 g; Refill: 1 - Ambulatory referral to Pulmonology - DG Chest 2 View; Future  3. Stage 3b chronic kidney disease (HCC) We will  continue to monitor this intermittently.  Wife knows to avoid NSAIDs with him.  4. PAF (paroxysmal atrial fibrillation) (HCC) He continues to sound to be in sinus rhythm.  Saw cardiology since last visit with me and Eliquis was discontinued.  5. Hyperlipidemia, unspecified hyperlipidemia type Continue pravastatin.  6. Hoarseness Recent thyroid studies done by endo normal Recommend referral to ENT for direct visualization of the vocal cords.  Wife wants to hold off on this for now stating that she wants him to see the pulmonologist first.   Patient was given the opportunity to ask questions.  Patient  verbalized understanding of the plan and was able to repeat key elements of the plan.   This documentation was completed using Paediatric nurse.  Any transcriptional errors are unintentional.  Orders Placed This Encounter  Procedures   DG Chest 2 View   Ambulatory referral to Pulmonology     Requested Prescriptions   Signed Prescriptions Disp Refills   albuterol (VENTOLIN HFA) 108 (90 Base) MCG/ACT inhaler 8 g 1    Sig: Inhale 2 puffs into the lungs every 6 (six) hours as needed for wheezing (AS needed for wheezing/cough).    Return in about 4 months (around 12/07/2023).  [name], MD, FACP

## 2023-08-07 ENCOUNTER — Ambulatory Visit
Admission: RE | Admit: 2023-08-07 | Discharge: 2023-08-07 | Disposition: A | Discharge status: Home / Self Care | Payer: Medicare Other | Source: Ambulatory Visit | Attending: Internal Medicine | Admitting: Internal Medicine

## 2023-08-07 DIAGNOSIS — R053 Chronic cough: Secondary | ICD-10-CM | POA: Diagnosis not present

## 2023-08-07 DIAGNOSIS — R0602 Shortness of breath: Secondary | ICD-10-CM | POA: Diagnosis not present

## 2023-08-10 ENCOUNTER — Other Ambulatory Visit: Payer: Self-pay | Admitting: Internal Medicine

## 2023-08-10 DIAGNOSIS — K219 Gastro-esophageal reflux disease without esophagitis: Secondary | ICD-10-CM

## 2023-08-15 ENCOUNTER — Other Ambulatory Visit: Payer: Self-pay | Admitting: Internal Medicine

## 2023-08-17 NOTE — Telephone Encounter (Signed)
Requested Prescriptions  Pending Prescriptions Disp Refills   metoprolol tartrate (LOPRESSOR) 25 MG tablet [Pharmacy Med Name: METOPROLOL TARTRATE 25 MG TAB] 180 tablet 1    Sig: TAKE 1 TABLET BY MOUTH TWICE A DAY     Cardiovascular:  Beta Blockers Failed - 08/15/2023  1:37 AM      Failed - Last BP in normal range    BP Readings from Last 1 Encounters:  08/06/23 (!) 169/80         Passed - Last Heart Rate in normal range    Pulse Readings from Last 1 Encounters:  08/06/23 64         Passed - Valid encounter within last 6 months    Recent Outpatient Visits           1 week ago Essential hypertension   [facility] Marcine Matar, MD   3 months ago Essential hypertension   [facility] [place], Cornelius Moras, RPH-CPP   4 months ago Essential hypertension   [facility] Marcine Matar, MD   8 months ago Essential hypertension   [facility] Marcine Matar, MD   1 year ago Hospital discharge follow-up   [facility] Marcine Matar, MD       Future Appointments             In 1 month Swinyer, Zachary George, NP [facility]   In 3 months Laural Benes, Binnie Rail, MD [facility]

## 2023-08-18 DIAGNOSIS — H04123 Dry eye syndrome of bilateral lacrimal glands: Secondary | ICD-10-CM | POA: Diagnosis not present

## 2023-08-18 DIAGNOSIS — H401232 Low-tension glaucoma, bilateral, moderate stage: Secondary | ICD-10-CM | POA: Diagnosis not present

## 2023-08-18 DIAGNOSIS — Z961 Presence of intraocular lens: Secondary | ICD-10-CM | POA: Diagnosis not present

## 2023-09-01 ENCOUNTER — Ambulatory Visit: Payer: Medicare Other | Admitting: Internal Medicine

## 2023-09-18 ENCOUNTER — Ambulatory Visit: Payer: Medicare Other | Admitting: Cardiology

## 2023-09-19 NOTE — Progress Notes (Unsigned)
Cardiology Office Note:  .   Date:  09/21/2023  ID:  Brandon Taylor, DOB Sep 08, 1941, MRN [id number] PCP: Marcine Matar, MD  [facility] Providers Cardiologist:  Thurmon Fair, MD    Patient Profile: .      PMH PAF  Atrial fibrillation with RVR Hypertension Hyperlipidemia, Vit B12 deficiency Dementia CKD  Patient first seen by heart care in October 2033 during an admission with COVID-19 when he was found to have A-fib with RVR.  He was started on Cardizem and transition to metoprolol and Eliquis.  TTE at that time showed LVEF mildly reduced at 50 to 55%, mild to moderate mitral insufficiency.  He was seen by Dr. Royann Shivers in October and was maintaining NSR at that time.  Of note, labs around time of admission showed TSH was incompletely suppressed at 0.328 and T4 was elevated at 1.84.  Thyroid-stimulating immunoglobulins were negative.  Cardiac monitor showed no atrial fibrillation.  There were occasional very brief episodes of nonsustained ectopic atrial tachycardia and very brief episodes of NSVT.  No evidence of meaningful bradycardia.  He had significant weight loss during that time.  I saw him in clinic on 10/27/2022 at which time he reported appetite was improving and he was gaining back some weight.  He appeared to be maintaining sinus rhythm on exam and no medication changes were made.  Last cardiology clinic visit was 04/30/2023 with Perlie Gold, PA.  He was still on Eliquis 2.5 mg twice daily for stroke prevention.  Case was discussed with Dr. Royann Shivers, primary cardiologist, who agreed that patient could stop Eliquis given that he has gone almost a full year without recurrence of A-fib.  He did not have any concerning cardiac symptoms and 35-month follow-up was recommended.        History of Present Illness: .   Brandon Taylor is a pleasant 82 y.o. male who is here today for follow-up of PAF and is accompanied by his wife. He reports that he is very active during the day,  engaging in yard work without any limitations. He denies chest pain, shortness of breath, palpitations, orthopnea, PND, edema, presyncope or syncope. He has not had any recent hospitalizations. He stopped his OAC at last office visit as advised. BP was slightly elevated at his last visit with Dr. Laural Benes in October, but attributes this to not taking his medication that day. He has a home blood pressure machine but has difficulty using it. He reports good appetite and hydration.   Discussed the use of AI scribe software for clinical note transcription with the patient, who gave verbal consent to proceed.   ROS: See HPI       Studies Reviewed: .        Risk Assessment/Calculations:    CHA2DS2-VASc Score = 3   This indicates a 3.2% annual risk of stroke. The patient's score is based upon: CHF History: 0 HTN History: 1 Diabetes History: 0 Stroke History: 0 Vascular Disease History: 0 Age Score: 2 Gender Score: 0            Physical Exam:   VS:  BP 128/60   Pulse (!) 58   Ht 5\' 10"  (1.778 m)   Wt 123 lb 3.2 oz (55.9 kg)   SpO2 94%   BMI 17.68 kg/m    Wt Readings from Last 3 Encounters:  09/21/23 123 lb 3.2 oz (55.9 kg)  08/06/23 121 lb (54.9 kg)  07/31/23 123 lb 9.6 oz (56.1 kg)  GEN: Thin elderly in no acute distress NECK: No JVD; No carotid bruits CARDIAC: RRR, no murmurs, rubs, gallops RESPIRATORY:  Clear to auscultation without rales, wheezing or rhonchi  ABDOMEN: Soft, non-tender, non-distended EXTREMITIES:  No edema; No deformity     ASSESSMENT AND PLAN: .    PAF: No recurrence of irregular heartbeat to his awareness. He clinically appears to be maintaining NSR. HR is well controlled. Discussed the risk of stroke if irregular heart rhythm returns.  His metoprolol is being filled by his PCP. Advised he may return as needed for cardiology follow-up.   Hypertension: BP is well controlled. Management per PCP.         Dispo: As needed  Signed, Eligha Bridegroom,  NP-C

## 2023-09-21 ENCOUNTER — Ambulatory Visit: Payer: Medicare Other | Attending: Cardiology | Admitting: Nurse Practitioner

## 2023-09-21 ENCOUNTER — Encounter: Payer: Self-pay | Admitting: Nurse Practitioner

## 2023-09-21 ENCOUNTER — Ambulatory Visit: Payer: Medicare Other | Admitting: Cardiology

## 2023-09-21 VITALS — BP 128/60 | HR 58 | Ht 70.0 in | Wt 123.2 lb

## 2023-09-21 DIAGNOSIS — I480 Paroxysmal atrial fibrillation: Secondary | ICD-10-CM | POA: Diagnosis not present

## 2023-09-21 DIAGNOSIS — I10 Essential (primary) hypertension: Secondary | ICD-10-CM

## 2023-09-21 NOTE — Patient Instructions (Signed)
Medication Instructions:   Your physician recommends that you continue on your current medications as directed. Please refer to the Current Medication list given to you today.   *If you need a refill on your cardiac medications before your next appointment, please call your pharmacy*   Lab Work:  None ordered.  If you have labs (blood work) drawn today and your tests are completely normal, you will receive your results only by: MyChart Message (if you have MyChart) OR A paper copy in the mail If you have any lab test that is abnormal or we need to change your treatment, we will call you to review the results.   Testing/Procedures:  None ordered.   Follow-Up: At [facility], you and your health needs are our priority.  As part of our continuing mission to provide you with exceptional heart care, we have created designated Provider Care Teams.  These Care Teams include your primary Cardiologist (physician) and Advanced Practice Providers (APPs -  Physician Assistants and Nurse Practitioners) who all work together to provide you with the care you need, when you need it.  We recommend signing up for the patient portal called "MyChart".  Sign up information is provided on this After Visit Summary.  MyChart is used to connect with patients for Virtual Visits (Telemedicine).  Patients are able to view lab/test results, encounter notes, upcoming appointments, etc.  Non-urgent messages can be sent to your provider as well.   To learn more about what you can do with MyChart, go to [email or web address].    Your next appointment:   As needed   Provider:   As needed

## 2023-09-24 ENCOUNTER — Other Ambulatory Visit: Payer: Self-pay | Admitting: Internal Medicine

## 2023-09-24 DIAGNOSIS — R053 Chronic cough: Secondary | ICD-10-CM

## 2023-10-09 ENCOUNTER — Other Ambulatory Visit: Payer: Self-pay | Admitting: Internal Medicine

## 2023-10-09 DIAGNOSIS — E785 Hyperlipidemia, unspecified: Secondary | ICD-10-CM

## 2023-11-04 NOTE — Progress Notes (Signed)
Brandon Taylor, male    DOB: 1941/05/15   MRN: [id number]   Brief patient profile:  61  yobm quit smoking 2014 due to cost with no resp cc  referred to pulmonary clinic 11/06/2023 by Dr [name]  for cough p covid  while already on PPI q am .    History of Present Illness  11/06/2023  Pulmonary/ 1st office eval/[facility]  PPI q d but not ac  Chief Complaint  Patient presents with   Consult    Cough worse at night x 6 months.  Patient did get SOB shoveling snow earlier this week.   Dyspnea:  Not limited by breathing from desired activities but apparently very sendentary x for the day he decided to shovel snow.    Cough: worse at hs> slt tinged yellow  Sleep: bed is flat /one pillow  SABA use: may help breathing some but never pre or rechallenges   02 XLK:GMWN     No obvious day to day or daytime pattern/variability or assoc mucus plugs or hemoptysis or cp or chest tightness, subjective wheeze or overt sinus or hb symptoms.    Also denies any obvious fluctuation of symptoms with weather or environmental changes or other aggravating or alleviating factors except as outlined above   No unusual exposure hx or h/o childhood pna/ asthma or knowledge of premature birth.  Current Allergies, Complete Past Medical History, Past Surgical History, Family History, and Social History were reviewed in [employer] record.  ROS  The following are not active complaints unless bolded Hoarseness, sore throat, dysphagia, dental problems, itching, sneezing,  nasal congestion or discharge of excess mucus or purulent secretions, ear ache,   fever, chills, sweats, unintended wt loss or wt gain, classically pleuritic or exertional cp,  orthopnea pnd or arm/hand swelling  or leg swelling, presyncope, palpitations, abdominal pain, anorexia, nausea, vomiting, diarrhea  or change in bowel habits or change in bladder habits, change in stools or change in urine, dysuria, hematuria,  rash,  arthralgias, visual complaints, headache, numbness, weakness or ataxia or problems with walking or coordination,  change in mood or  memory.             Outpatient Medications Prior to Visit  Medication Sig Dispense Refill   albuterol (VENTOLIN HFA) 108 (90 Base) MCG/ACT inhaler INHALE 2 PUFFS INTO THE LUNGS EVERY 6 (SIX) HOURS AS NEEDED FOR WHEEZING (AS NEEDED FOR WHEEZING/COUGH). 8.5 each 1   [employer] VITAMIN B12 1000 MCG tablet TAKE 1 TABLET BY MOUTH EVERY DAY (Patient taking differently: Take 1,000 mcg by mouth daily.) 100 tablet 0   D-1000 EXTRA STRENGTH 25 MCG (1000 UT) tablet TAKE 1 TABLET BY MOUTH EVERY DAY 90 tablet 1   ferrous sulfate 325 (65 FE) MG tablet TAKE 1 TABLET BY MOUTH 4 TIMES A WEEK 48 tablet 0   latanoprost (XALATAN) 0.005 % ophthalmic solution Place 1 drop into both eyes at bedtime as needed.     metoprolol tartrate (LOPRESSOR) 25 MG tablet TAKE 1 TABLET BY MOUTH TWICE A DAY 180 tablet 1   pantoprazole (PROTONIX) 40 MG tablet TAKE 1 TABLET BY MOUTH EVERY DAY 90 tablet 1   pravastatin (PRAVACHOL) 20 MG tablet TAKE 1 TABLET BY MOUTH EVERY DAY 90 tablet 1   No facility-administered medications prior to visit.    Past Medical History:  Diagnosis Date   ETOH abuse    GERD (gastroesophageal reflux disease)       Objective:  BP 114/80 (BP Location: Left Arm, Patient Position: Sitting, Cuff Size: Normal)   Pulse 79   Temp 98 F (36.7 C) (Oral)   Ht 5\' 9"  (1.753 m)   Wt 128 lb (58.1 kg)   SpO2 95%   BMI 18.90 kg/m   SpO2: 95 % RA  Very hoarse bm  nad very poor dentition   HEENT : Oropharynx  clear   Nasal turbinates nl    NECK :  without  apparent JVD/ palpable Nodes/TM    LUNGS: no acc muscle use,  Mild barrel  contour chest wall with bilateral  Distant bs s audible wheeze and  without cough on insp or exp maneuvers  and mild  Hyperresonant  to  percussion bilaterally     CV:  RRR  no s3 or murmur or increase in P2, and no edema   ABD:  soft and  nontender with pos end  insp Hoover's  in the supine position.  No bruits or organomegaly appreciated   MS:  Nl gait/ ext warm without deformities Or obvious joint restrictions  calf tenderness, cyanosis or clubbing     SKIN: warm and dry without lesions    NEURO:  alert, approp, nl sensorium with  no motor or cerebellar deficits apparent.       I personally reviewed images and agree with radiology impression as follows:  CXR:   pa and lateral 08/07/23  Findings suggest COPD. Otherwise no acute cardiopulmonary disease.  Classically hyerinflated lung      Assessment   Upper airway cough syndrome Onset summer 2024 p covid   - Allergy screen 11/06/2023 >  Eos 0.0 /  IgE     -  11/06/2023 max gerd rx/ 1st gen H1 blockers per guidelines  and Prednisone 10 mg take  4 each am x 2 days,   2 each am x 2 days,  1 each am x 2 days and stop   Upper airway cough syndrome (previously labeled PNDS),  is so named because it's frequently impossible to sort out how much is  CR/sinusitis with freq throat clearing (which can be related to primary GERD)   vs  causing  secondary (" extra esophageal")  GERD from wide swings in gastric pressure that occur with throat clearing, often  promoting self use of mint and menthol lozenges that reduce the lower esophageal sphincter tone and exacerbate the problem further in a cyclical fashion.   These are the same pts (now being labeled as having "irritable larynx syndrome" by some cough centers) who not infrequently have a history of having failed to tolerate ace inhibitors,  dry powder inhalers or biphosphonates or report having atypical/extraesophageal reflux(eg LPR may be the case here)  symptoms that don't respond to standard doses of PPI  and are easily confused as having aecopd or asthma flares by even experienced allergists/ pulmonologists (myself included).    He does appear to have significant copd but most of his concern today is the cough and if he really is  limited by doe would consider PFT and a trial of stiolto c/w Group B pt but avoid dpi inhalers which tend to trigger UACS, as above.   In meantime can use saba prn: Re SABA :  I spent extra time with pt today reviewing appropriate use of albuterol for prn use on exertion with the following points: 1) saba is for relief of sob that does not improve by walking a slower pace or resting but rather if  the pt does not improve after trying this first. 2) If the pt is convinced, as many are, that saba helps recover from activity faster then it's easy to tell if this is the case by re-challenging : ie stop, take the inhaler, then p 5 minutes try the exact same activity (intensity of workload) that just caused the symptoms and see if they are substantially diminished or not after saba 3) if there is an activity that reproducibly causes the symptoms, try the saba 15 min before the activity on alternate days   If in fact the saba really does help, then fine to continue to use it prn but advised may need to look closer at the maintenance regimen being used to achieve better control of airways disease with exertion.          Each maintenance medication was reviewed in detail including emphasizing most importantly the difference between maintenance and prns and under what circumstances the prns are to be triggered using an action plan format where appropriate.  Total time for H and P, chart review, counseling, reviewing hfa device(s) and generating customized AVS unique to this office visit / same day charting = 45 min new pt eval        Sandrea Hughs, MD 11/06/2023

## 2023-11-06 ENCOUNTER — Encounter: Payer: Self-pay | Admitting: Internal Medicine

## 2023-11-06 ENCOUNTER — Ambulatory Visit: Payer: Medicare Other | Admitting: Internal Medicine

## 2023-11-06 VITALS — BP 114/80 | HR 79 | Temp 98.0°F | Ht 69.0 in | Wt 128.0 lb

## 2023-11-06 DIAGNOSIS — R058 Other specified cough: Secondary | ICD-10-CM

## 2023-11-06 LAB — CBC WITH DIFFERENTIAL/PLATELET
Basophils Absolute: 0 10*3/uL (ref 0.0–0.1)
Basophils Relative: 0.8 % (ref 0.0–3.0)
Eosinophils Absolute: 0 10*3/uL (ref 0.0–0.7)
Eosinophils Relative: 0.7 % (ref 0.0–5.0)
HCT: 38.4 % — ABNORMAL LOW (ref 39.0–52.0)
Hemoglobin: 12.5 g/dL — ABNORMAL LOW (ref 13.0–17.0)
Lymphocytes Relative: 19.8 % (ref 12.0–46.0)
Lymphs Abs: 1.1 10*3/uL (ref 0.7–4.0)
MCHC: 32.5 g/dL (ref 30.0–36.0)
MCV: 91.1 fL (ref 78.0–100.0)
Monocytes Absolute: 0.6 10*3/uL (ref 0.1–1.0)
Monocytes Relative: 9.8 % (ref 3.0–12.0)
Neutro Abs: 3.9 10*3/uL (ref 1.4–7.7)
Neutrophils Relative %: 68.9 % (ref 43.0–77.0)
Platelets: 214 10*3/uL (ref 150.0–400.0)
RBC: 4.22 Mil/uL (ref 4.22–5.81)
RDW: 14.5 % (ref 11.5–15.5)
WBC: 5.6 10*3/uL (ref 4.0–10.5)

## 2023-11-06 MED ORDER — PREDNISONE 10 MG PO TABS: ORAL_TABLET | ORAL | 0 refills | Status: DC

## 2023-11-06 NOTE — Assessment & Plan Note (Addendum)
Onset summer 2024 p covid   - Allergy screen 11/06/2023 >  Eos 0.0 /  IgE  pending  -  11/06/2023 max gerd rx/ 1st gen H1 blockers per guidelines  and Prednisone 10 mg take  4 each am x 2 days,   2 each am x 2 days,  1 each am x 2 days and stop   Upper airway cough syndrome (previously labeled PNDS),  is so named because it's frequently impossible to sort out how much is  CR/sinusitis with freq throat clearing (which can be related to primary GERD)   vs  causing  secondary (" extra esophageal")  GERD from wide swings in gastric pressure that occur with throat clearing, often  promoting self use of mint and menthol lozenges that reduce the lower esophageal sphincter tone and exacerbate the problem further in a cyclical fashion.   These are the same pts (now being labeled as having "irritable larynx syndrome" by some cough centers) who not infrequently have a history of having failed to tolerate ace inhibitors,  dry powder inhalers or biphosphonates or report having atypical/extraesophageal reflux(eg LPR may be the case here)  symptoms that don't respond to standard doses of PPI  and are easily confused as having aecopd or asthma flares by even experienced allergists/ pulmonologists (myself included).    He does appear to have significant copd but most of his concern today is the cough and if he really is limited by doe would consider PFT and a trial of stiolto c/w Group B pt but avoid dpi inhalers which tend to trigger UACS, as above.   In meantime can use saba prn: Re SABA :  I spent extra time with pt today reviewing appropriate use of albuterol for prn use on exertion with the following points: 1) saba is for relief of sob that does not improve by walking a slower pace or resting but rather if the pt does not improve after trying this first. 2) If the pt is convinced, as many are, that saba helps recover from activity faster then it's easy to tell if this is the case by re-challenging : ie stop, take  the inhaler, then p 5 minutes try the exact same activity (intensity of workload) that just caused the symptoms and see if they are substantially diminished or not after saba 3) if there is an activity that reproducibly causes the symptoms, try the saba 15 min before the activity on alternate days   If in fact the saba really does help, then fine to continue to use it prn but advised may need to look closer at the maintenance regimen being used to achieve better control of airways disease with exertion.   Each maintenance medication was reviewed in detail including emphasizing most importantly the difference between maintenance and prns and under what circumstances the prns are to be triggered using an action plan format where appropriate.  Total time for H and P, chart review, counseling, reviewing hfa device(s) and generating customized AVS unique to this office visit / same day charting = 45 min new pt eval

## 2023-11-06 NOTE — Patient Instructions (Addendum)
Pantoprazole (protonix) 40 mg   Take  30-60 min before first meal of the day and add Pepcid (famotidine)  20 mg after supper about an hour before bed until return to office - this is the best way to tell whether stomach acid is contributing to your problem.    For drainage / throat tickle try take CHLORPHENIRAMINE  4 mg  ("Allergy Relief" 4mg   at [facility] should be easiest to find in the blue box usually on bottom shelf)  take one every 4 hours as needed - extremely effective and inexpensive over the counter- may cause drowsiness so start with just a dose or two an hour before bedtime and see how you tolerate it before trying in daytime.   GERD (REFLUX)  is an extremely common cause of respiratory symptoms just like yours , many times with no obvious heartburn at all.    It can be treated with medication, but also with lifestyle changes including elevation of the head of your bed (ideally with 6 -8inch blocks under the headboard of your bed),  Smoking cessation, avoidance of late meals, excessive alcohol, and avoid fatty foods, chocolate, peppermint, colas, red wine, and acidic juices such as orange juice.  NO MINT OR MENTHOL PRODUCTS SO NO COUGH DROPS  (Luden's)  USE SUGARLESS CANDY INSTEAD (Jolley ranchers or Stover's or Life Savers) or even ice chips will also do - the key is to swallow to prevent all throat clearing. NO OIL BASED VITAMINS - use powdered substitutes.  Avoid fish oil when coughing.    Prednisone 10 mg take  4 each am x 2 days,   2 each am x 2 days,  1 each am x 2 days and stop   Please remember to go to the lab department   for your tests - we will call you with the results when they are available.      Call for folllow up in 2 weeks if not happy with results

## 2023-11-08 ENCOUNTER — Other Ambulatory Visit: Payer: Self-pay | Admitting: Internal Medicine

## 2023-11-08 DIAGNOSIS — R053 Chronic cough: Secondary | ICD-10-CM

## 2023-11-09 LAB — IGE: IgE (Immunoglobulin E), Serum: 268 kU/L — ABNORMAL HIGH (ref <=114)

## 2023-11-09 NOTE — Telephone Encounter (Signed)
Requested by interface surescripts. Future visit in 4 weeks. Requested Prescriptions  Pending Prescriptions Disp Refills   albuterol (VENTOLIN HFA) 108 (90 Base) MCG/ACT inhaler [Pharmacy Med Name: ALBUTEROL HFA (PROAIR) INHALER] 8.5 each 0    Sig: INHALE 2 PUFFS INTO THE LUNGS EVERY 6 (SIX) HOURS AS NEEDED FOR WHEEZING (AS NEEDED FOR WHEEZING/COUGH).     Pulmonology:  [facility]onists 2 Passed - 11/09/2023  3:01 PM      Passed - Last BP in normal range    BP Readings from Last 1 Encounters:  11/06/23 114/80         Passed - Last Heart Rate in normal range    Pulse Readings from Last 1 Encounters:  11/06/23 79         Passed - Valid encounter within last 12 months    Recent Outpatient Visits           3 months ago Essential hypertension   [place] [facility] [place] [facility] Dept Of [place]. [facility] Marcine Matar, MD   6 months ago Essential hypertension   [place] [facility] [place] - [facility] Dept Of [place] Drucilla Chalet, RPH-CPP   7 months ago Essential hypertension   [facility] [place] [facility] [place] [facility] Dept Of [place]. [facility] Marcine Matar, MD   11 months ago Essential hypertension   [place] [facility] [place] [facility] Dept Of [place]. [facility] Marcine Matar, MD   1 year ago Hospital discharge follow-up   [place] [facility] - [facility] Dept Of [place]. [facility] Marcine Matar, MD       Future Appointments             In 4 weeks Marcine Matar, MD [facility] [place] - A Dept Of [place]. [facility]

## 2023-12-07 ENCOUNTER — Ambulatory Visit: Payer: Medicare Other | Attending: Internal Medicine | Admitting: Internal Medicine

## 2023-12-07 ENCOUNTER — Encounter: Payer: Self-pay | Admitting: Internal Medicine

## 2023-12-07 VITALS — BP 114/66 | HR 71 | Temp 97.8°F | Ht 69.0 in | Wt 119.0 lb

## 2023-12-07 DIAGNOSIS — R053 Chronic cough: Secondary | ICD-10-CM | POA: Diagnosis not present

## 2023-12-07 DIAGNOSIS — I480 Paroxysmal atrial fibrillation: Secondary | ICD-10-CM | POA: Diagnosis not present

## 2023-12-07 DIAGNOSIS — N1832 Chronic kidney disease, stage 3b: Secondary | ICD-10-CM | POA: Diagnosis not present

## 2023-12-07 DIAGNOSIS — I10 Essential (primary) hypertension: Secondary | ICD-10-CM

## 2023-12-07 DIAGNOSIS — D638 Anemia in other chronic diseases classified elsewhere: Secondary | ICD-10-CM

## 2023-12-07 DIAGNOSIS — R490 Dysphonia: Secondary | ICD-10-CM

## 2023-12-07 DIAGNOSIS — F0280 Dementia in other diseases classified elsewhere without behavioral disturbance: Secondary | ICD-10-CM

## 2023-12-07 DIAGNOSIS — G309 Alzheimer's disease, unspecified: Secondary | ICD-10-CM | POA: Diagnosis not present

## 2023-12-07 DIAGNOSIS — R634 Abnormal weight loss: Secondary | ICD-10-CM

## 2023-12-07 DIAGNOSIS — E46 Unspecified protein-calorie malnutrition: Secondary | ICD-10-CM

## 2023-12-07 MED ORDER — FLUTICASONE PROPIONATE 50 MCG/ACT NA SUSP: 2.0000 | Freq: Every day | NASAL | 6 refills | Status: DC

## 2023-12-07 MED ORDER — FLUTICASONE PROPIONATE 50 MCG/ACT NA SUSP: 1.0000 | Freq: Every day | NASAL | 1 refills | Status: DC

## 2023-12-07 NOTE — Progress Notes (Addendum)
 Patient ID: Brandon Taylor, male    DOB: 11-Aug-1941  MRN: [id number]  CC: Hypertension (HTN f/u. Denton Meek about unintentional weight loss/Already received flu vax)   Subjective: Brandon Taylor is a 83 y.o. male who presents for chronic ds management. Wife is with him and great-granddaughter are with him. His concerns today include:  history of A.Fib, GERD, Hx of ETOH abuse, subacute thyroiditis resolved and off medication, dementia, vitamin B12 deficiency, HL, HTN, CKD 3, ACD.   HTN/PAF: Patient continues on metoprolol 25 mg twice a day.  Eliquis was discontinued by cardiology.  Saw cardiology in follow-up 09/2023.  He has not had any palpitations or chest pains.  HL: He continue on pravastatin 20 mg daily.  CKD 3: Last 2 GFR's have been in the 40s.  Most recent 1 done in June was 41.  Not on NSAIDs.  On last visit he complained of chronic cough x 4 to 5 months that occurs mainly at nights.  He also reported hoarseness of the voice with voice being raspy and was worse.  Patient does not smoke.  I had recommended a referral to ENT for the hoarseness and to pulmonary for the cough.  He wanted to hold off on the ENT referral until he saw the pulmonologist.  Today the hoarseness has persisted.   He was tried with albuterol inhaler; he reports it helps sometimes; not using every day.  He has seen pulmonary Dr. Sherene Sires.  Diagnosed with a UACS and moderate allergies with IgE elevated at 268.  Given Prednisone taper which helped. Also takes generic Claritin OTC which he takes daily and has helped but cough has not gone away completely. Dr. Sherene Sires suggested seeing an allergist -Cough worse at nights; endorses drainage at back of throat  Wgh Loss:  when I last saw him 07/2023 he was 121 lbs.  Today he is 119 lbs; wgh recorded as 128 on Jan 24th when he saw pulmonary.  Wife does not think that was recorded correctly -still down 11 lbs compared to 11/2022.  -feels weak a lot -admits he is not getting in 3 meals  a day - "sometimes I do and sometimes I don't." He nibbles on food; states he just does not have appetite    No night sweats. Swallowing ok; no blood in stools. No stomach pains. TSH was nl Quit smoking over 5 yrs ago. He was a light smoker.  1 pack use to last 3 wks.  Quit ETOH many yrs ago He has chronic stable anemia with hemoglobin around 12.  Dementia:  wife has not seen any significant change.  He has not had any issues with safety around the house or wanders out of the house.  Patient Active Problem List   Diagnosis Date Noted   Upper airway cough syndrome 11/06/2023   History of hyperthyroidism 07/31/2023   Moderate protein-calorie malnutrition (HCC) 11/28/2022   PAF (paroxysmal atrial fibrillation) (HCC) 07/28/2022   Hyperthyroidism 07/28/2022   Atrial fibrillation with RVR (HCC) 06/28/2022   Pneumonia due to COVID-19 virus 06/28/2022   Stage 3b chronic kidney disease (HCC) 12/04/2020   Anemia, chronic disease 09/28/2019   Mixed hyperlipidemia 07/20/2017   Mild dementia (HCC) 05/29/2017   Surgical site reaction 12/06/2015   Routine general medical examination at a health care facility 10/24/2014   Vitamin B12 deficiency 10/03/2013   Loss of weight 10/03/2013   Alcohol abuse 10/03/2013   Nicotine abuse 10/03/2013   Hypoglycemia 09/22/2013   General weakness 09/22/2013  GERD (gastroesophageal reflux disease)    ETOH abuse      Current Outpatient Medications on File Prior to Visit  Medication Sig Dispense Refill   albuterol (VENTOLIN HFA) 108 (90 Base) MCG/ACT inhaler INHALE 2 PUFFS INTO THE LUNGS EVERY 6 (SIX) HOURS AS NEEDED FOR WHEEZING (AS NEEDED FOR WHEEZING/COUGH). 8.5 each 0   [facility] VITAMIN B12 1000 MCG tablet TAKE 1 TABLET BY MOUTH EVERY DAY (Patient taking differently: Take 1,000 mcg by mouth daily.) 100 tablet 0   D-1000 EXTRA STRENGTH 25 MCG (1000 UT) tablet TAKE 1 TABLET BY MOUTH EVERY DAY 90 tablet 1   ferrous sulfate 325 (65 FE) MG tablet TAKE 1 TABLET BY  MOUTH 4 TIMES A WEEK 48 tablet 0   latanoprost (XALATAN) 0.005 % ophthalmic solution Place 1 drop into both eyes at bedtime as needed.     metoprolol tartrate (LOPRESSOR) 25 MG tablet TAKE 1 TABLET BY MOUTH TWICE A DAY 180 tablet 1   pantoprazole (PROTONIX) 40 MG tablet TAKE 1 TABLET BY MOUTH EVERY DAY 90 tablet 1   pravastatin (PRAVACHOL) 20 MG tablet TAKE 1 TABLET BY MOUTH EVERY DAY 90 tablet 1   predniSONE (DELTASONE) 10 MG tablet Take  4 each am x 2 days,   2 each am x 2 days,  1 each am x 2 days and stop 14 tablet 0   No current facility-administered medications on file prior to visit.    No Known Allergies  Social History   Socioeconomic History   Marital status: Married    Spouse name: Not on file   Number of children: Not on file   Years of education: Not on file   Highest education level: Not on file  Occupational History   Not on file  Tobacco Use   Smoking status: Former    Current packs/day: 0.00    Average packs/day: 2.0 packs/day for 40.0 years (80.0 ttl pk-yrs)    Types: Cigarettes    Start date: 10/13/1972    Quit date: 10/13/2012    Years since quitting: 11.1   Smokeless tobacco: Never  Substance and Sexual Activity   Alcohol use: Not Currently    Comment: fifth daily   Drug use: No   Sexual activity: Not on file  Other Topics Concern   Not on file  Social History Narrative   Pt lives in 1 story home with his wife   Has 2 adult daughters - live here in [place]. Attends church weekly.    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[employer] (?)   Social Drivers of [employer] Strain: Medium Risk (08/06/2023)   Overall Financial Resource Strain (CARDIA)    Difficulty of Paying Living Expenses: Somewhat hard  Food Insecurity: No Food Insecurity (08/06/2023)   Hunger Vital Sign    Worried About Running Out of Food in the Last Year: Never true    Ran Out of Food in the Last Year: Never true   Transportation Needs: No Transportation Needs (08/06/2023)   PRAPARE - Administrator, Civil Service (Medical): No    Lack of Transportation (Non-Medical): No  Physical Activity: Sufficiently Active (08/06/2023)   Exercise Vital Sign    Days of Exercise per Week: 5 days    Minutes of Exercise per Session: 60 min  Stress: No Stress Concern Present (08/06/2023)   [employer] of Occupational Health - Occupational Stress Questionnaire    Feeling  of Stress : Not at all  Social Connections: Moderately Integrated (08/06/2023)   Social Connection and Isolation Panel [NHANES]    Frequency of Communication with Friends and Family: Twice a week    Frequency of Social Gatherings with Friends and Family: More than three times a week    Attends Religious Services: Never    Database administrator or Organizations: Yes    Attends Banker Meetings: Never    Marital Status: Married  Catering manager Violence: Not At Risk (08/06/2023)   Humiliation, Afraid, Rape, and Kick questionnaire    Fear of Current or Ex-Partner: No    Emotionally Abused: No    Physically Abused: No    Sexually Abused: No    Family History  Problem Relation Age of Onset   COPD Brother     Past Surgical History:  Procedure Laterality Date   NO PAST SURGERIES      ROS: Review of Systems Negative except as stated above  PHYSICAL EXAM: BP 114/66 (BP Location: Left Arm, Patient Position: Sitting)   Pulse 71   Temp 97.8 F (36.6 C) (Oral)   Ht 5\' 9"  (1.753 m)   Wt 119 lb (54 kg)   SpO2 95%   BMI 17.57 kg/m   Wt Readings from Last 3 Encounters:  12/07/23 119 lb (54 kg)  11/06/23 128 lb (58.1 kg)  09/21/23 123 lb 3.2 oz (55.9 kg)    Physical Exam  General appearance -elderly African-American male in NAD.  He has temporal wasting and appears overly thin for his height.  He is wearing 2 pairs of pants and heavy shoes which means that his weight is probably 2 pounds in the last than  what is recorded today. Mental status -patient answers most questions appropriately.  Wife helps with history. Neck - supple, no significant adenopathy.  No thyromegaly Mouth: Oral mucosa is moist.  He has several decayed teeth some of which are not half broken off in the gum Chest - clear to auscultation, no wheezes, rales or rhonchi, symmetric air entry Heart - normal rate, regular rhythm, normal S1, S2, no murmurs, rubs, clicks or gallops Abdomen: Nondistended, normal bowel sounds, nontender, no organomegaly.  He has several scars over the abdomen from previous abdominal surgery.  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 Extremities - peripheral pulses normal, no pedal edema, no clubbing or cyanosis     12/07/2023    9:05 AM 08/06/2023    1:43 PM 04/06/2023    2:29 PM  Depression screen PHQ 2/9  Decreased Interest 0 0 0  Down, Depressed, Hopeless 0 0 0  PHQ - 2 Score 0 0 0  Altered sleeping 0 0 0  Tired, decreased energy 0 0 0  Change in appetite 0 0 0  Feeling bad or failure about yourself  0 0 0  Trouble concentrating 0 0 0  Moving slowly or fidgety/restless 0 0 0  Suicidal thoughts 0 0 0  PHQ-9 Score 0 0 0  Difficult doing work/chores Not difficult at all Not difficult at all        Latest Ref Rng & Units 04/06/2023    3:42 PM 10/27/2022   12:45 PM 07/28/2022    3:51 PM  CMP  Glucose 70 - 99 mg/dL 80  91  83   BUN 8 - 27 mg/dL 22  16  18    Creatinine  0.76 - 1.27 mg/dL 8.46  9.62  9.52   Sodium 134 - 144 mmol/L 140  143  143   Potassium 3.5 - 5.2 mmol/L 4.5  4.3  5.2   Chloride 96 - 106 mmol/L 103  105  107   CO2 20 - 29 mmol/L 23  26  22    Calcium 8.6 - 10.2 mg/dL 9.2  9.1  8.6   Total Protein 6.0 - 8.5 g/dL 7.4     Total Bilirubin 0.0 - 1.2 mg/dL 0.2     Alkaline Phos 44 - 121 IU/L 103     AST 0 - 40 IU/L 19     ALT 0 - 44 IU/L 8      Lipid Panel     Component Value Date/Time   CHOL  201 (H) 12/03/2020 1629   TRIG 72 12/03/2020 1629   HDL 83 12/03/2020 1629   CHOLHDL 2.4 12/03/2020 1629   CHOLHDL 2.5 01/02/2014 0947   VLDL 14 01/02/2014 0947   LDLCALC 105 (H) 12/03/2020 1629    CBC    Component Value Date/Time   WBC 5.6 11/06/2023 1008   RBC 4.22 11/06/2023 1008   HGB 12.5 (L) 11/06/2023 1008   HGB 12.1 (L) 04/06/2023 1542   HCT 38.4 (L) 11/06/2023 1008   HCT 37.2 (L) 04/06/2023 1542   PLT 214.0 11/06/2023 1008   PLT 235 04/06/2023 1542   MCV 91.1 11/06/2023 1008   MCV 88 04/06/2023 1542   MCH 28.7 04/06/2023 1542   MCH 29.2 06/30/2022 0926   MCHC 32.5 11/06/2023 1008   RDW 14.5 11/06/2023 1008   RDW 13.0 04/06/2023 1542   LYMPHSABS 1.1 11/06/2023 1008   LYMPHSABS 1.2 05/02/2021 1140   MONOABS 0.6 11/06/2023 1008   EOSABS 0.0 11/06/2023 1008   EOSABS 0.0 05/02/2021 1140   BASOSABS 0.0 11/06/2023 1008   BASOSABS 0.0 05/02/2021 1140    ASSESSMENT AND PLAN: 1. Essential hypertension (Primary) At goal.  Continue metoprolol 25 mg twice a day. - Comprehensive metabolic panel  2. Unintended weight loss Likely due to poor oral intake.  I have encouraged both the patient and his wife to have him eat at least 3 solid meals a day.  Supplement meals with fruits and nutrition shakes like Ensure or when he is not able to complete a full meal.  Prescription given for Ensure shakes but I am not sure whether it is covered by his insurance. -Discussed with patient and wife how aggressive they would like for the weight loss to be worked up.  If they would like for me to do further workup to screen for any underlying masses/cancers, I would need to do CAT scan of the chest abdomen and pelvis.  He is agreeable to that.  Will check some baseline blood test today including chemistry to see where his GFR lies before ordering CAT scan with contrast.  Will not order PSA as patient's life expectancy is likely less than 10 years. - For home use only DME Other see comment -  TSH  3. Chronic cough Does seem to have a postnasal drip component.  Advised to continue Claritin.  We will add Flonase nasal spray.  Advised 1 spray in each nostril daily at bedtime.  He would like to hold off on referral to an allergist.  Wants to see if the Flonase in combination with Claritin works. - fluticasone (FLONASE) 50 MCG/ACT nasal spray; Place 1 spray into both nostrils daily.  Dispense: 16 g; Refill:  1  4. Stage 3b chronic kidney disease (HCC) Recheck chemistry today.  5. Dementia due to Alzheimer's disease (HCC) Stable.  Not on any medication at this time as potential side effects likely outweigh benefits  6. Paroxysmal atrial fibrillation (HCC) He has not had any recurrence.  Off anticoagulation.  Continue metoprolol  7. Hoarseness This has persisted.  He is agreeable to ENT referral.  8. Malnutrition, unspecified type (HCC) See #2 above - For home use only DME Other see comment  9. Anemia, chronic disease Stable chronic anemia.  Will recheck iron studies. - Iron, TIBC and Ferritin Panel   Patient and wife were given the opportunity to ask questions.  They verbalized understanding of the plan. This documentation was completed using Paediatric nurse.  Any transcriptional errors are unintentional.  No orders of the defined types were placed in this encounter.    Requested Prescriptions    No prescriptions requested or ordered in this encounter    No follow-ups on file.  [name], MD, FACP

## 2023-12-07 NOTE — Patient Instructions (Signed)
 Try to eat 3 meals a day.  Supplement your meals with fruits.  I have also given a prescription for Ensure shakes.  You can carry them to a medical supply store to see if your insurance pays for them.  We will plan to do a CAT scan of the abdomen/pelvis and your chest as part of your workup for unintentional weight loss.  I will need to wait for the results of your blood test to make sure your kidney function is okay to give the dye that is needed to do the CAT scan.  I have sent a prescription to your pharmacy for Flonase nasal spray.  Put 1 spray in each nostril at bedtime.  Continue daily Claritin.

## 2023-12-08 LAB — IRON,TIBC AND FERRITIN PANEL
Ferritin: 459 ng/mL — ABNORMAL HIGH (ref 30–400)
Iron Saturation: 7 % — CL (ref 15–55)
Iron: 17 ug/dL — ABNORMAL LOW (ref 38–169)
Total Iron Binding Capacity: 258 ug/dL (ref 250–450)
UIBC: 241 ug/dL (ref 111–343)

## 2023-12-08 LAB — COMPREHENSIVE METABOLIC PANEL
ALT: 9 IU/L (ref 0–44)
AST: 19 IU/L (ref 0–40)
Albumin: 4 g/dL (ref 3.7–4.7)
Alkaline Phosphatase: 117 IU/L (ref 44–121)
BUN/Creatinine Ratio: 13 (ref 10–24)
BUN: 23 mg/dL (ref 8–27)
Bilirubin Total: 0.4 mg/dL (ref 0.0–1.2)
CO2: 20 mmol/L (ref 20–29)
Calcium: 9.4 mg/dL (ref 8.6–10.2)
Chloride: 106 mmol/L (ref 96–106)
Creatinine, Ser: 1.76 mg/dL — ABNORMAL HIGH (ref 0.76–1.27)
Globulin, Total: 2.9 g/dL (ref 1.5–4.5)
Glucose: 105 mg/dL — ABNORMAL HIGH (ref 70–99)
Potassium: 5 mmol/L (ref 3.5–5.2)
Sodium: 142 mmol/L (ref 134–144)
Total Protein: 6.9 g/dL (ref 6.0–8.5)
eGFR: 38 mL/min/{1.73_m2} — ABNORMAL LOW (ref >59)

## 2023-12-08 LAB — TSH: TSH: 2.26 u[IU]/mL (ref 0.450–4.500)

## 2023-12-09 ENCOUNTER — Telehealth: Payer: Self-pay | Admitting: Internal Medicine

## 2023-12-09 DIAGNOSIS — N1832 Chronic kidney disease, stage 3b: Secondary | ICD-10-CM

## 2023-12-09 DIAGNOSIS — R634 Abnormal weight loss: Secondary | ICD-10-CM

## 2023-12-09 DIAGNOSIS — R053 Chronic cough: Secondary | ICD-10-CM

## 2023-12-09 MED ORDER — FERROUS SULFATE 325 (65 FE) MG PO TABS: ORAL_TABLET | ORAL | 1 refills | Status: DC

## 2023-12-09 NOTE — Telephone Encounter (Signed)
 Phone call placed to patient this morning.  I was able to speak with patient's wife regarding his recent lab test.  Informed that patient's kidney function remains at CKD stage III but has declined slightly compared to when it was last checked in June of last year. -Recommend referral to nephrology -Recommend patient to drink 4 to 6 glasses of water daily  -Will move forward with CAT scan of chest abdomen and pelvis but will do it without contrast.  Advised that study may be a bit limited without the contrast but given his age and kidney function, best to do without contrast.  Thyroid level remains normal.  He has chronic anemia that has remained stable.  Looks like anemia of chronic disease likely associated with CKD.  Patient was previously on iron supplement 4 times a week but has been out of it for 1 month.  Based on his levels, will change to twice a week.  All questions were answered.

## 2024-01-04 ENCOUNTER — Ambulatory Visit
Admission: RE | Admit: 2024-01-04 | Discharge: 2024-01-04 | Disposition: A | Discharge status: Home / Self Care | Payer: Medicare Other | Source: Ambulatory Visit | Attending: Internal Medicine | Admitting: Internal Medicine

## 2024-01-04 DIAGNOSIS — R634 Abnormal weight loss: Secondary | ICD-10-CM

## 2024-01-04 DIAGNOSIS — I2510 Atherosclerotic heart disease of native coronary artery without angina pectoris: Secondary | ICD-10-CM | POA: Diagnosis not present

## 2024-01-04 DIAGNOSIS — J439 Emphysema, unspecified: Secondary | ICD-10-CM | POA: Diagnosis not present

## 2024-01-04 DIAGNOSIS — R053 Chronic cough: Secondary | ICD-10-CM

## 2024-01-05 DIAGNOSIS — J387 Other diseases of larynx: Secondary | ICD-10-CM | POA: Diagnosis not present

## 2024-01-05 DIAGNOSIS — R490 Dysphonia: Secondary | ICD-10-CM | POA: Diagnosis not present

## 2024-01-05 DIAGNOSIS — R634 Abnormal weight loss: Secondary | ICD-10-CM | POA: Diagnosis not present

## 2024-02-04 ENCOUNTER — Other Ambulatory Visit: Payer: Self-pay | Admitting: Internal Medicine

## 2024-02-04 DIAGNOSIS — K219 Gastro-esophageal reflux disease without esophagitis: Secondary | ICD-10-CM

## 2024-02-04 DIAGNOSIS — N1832 Chronic kidney disease, stage 3b: Secondary | ICD-10-CM | POA: Diagnosis not present

## 2024-02-04 DIAGNOSIS — I129 Hypertensive chronic kidney disease with stage 1 through stage 4 chronic kidney disease, or unspecified chronic kidney disease: Secondary | ICD-10-CM | POA: Diagnosis not present

## 2024-02-04 DIAGNOSIS — D631 Anemia in chronic kidney disease: Secondary | ICD-10-CM | POA: Diagnosis not present

## 2024-02-07 ENCOUNTER — Other Ambulatory Visit: Payer: Self-pay | Admitting: Internal Medicine

## 2024-02-09 LAB — LAB REPORT - SCANNED
Albumin, Urine POC: 385.6
Creatinine, POC: 237.2 mg/dL
EGFR: 38
Microalb Creat Ratio: 163

## 2024-03-04 ENCOUNTER — Encounter: Payer: Self-pay | Admitting: Internal Medicine

## 2024-03-04 ENCOUNTER — Ambulatory Visit: Payer: Medicare Other | Attending: Internal Medicine | Admitting: Internal Medicine

## 2024-03-04 VITALS — BP 165/78 | HR 59 | Temp 98.4°F | Ht 69.0 in | Wt 124.0 lb

## 2024-03-04 DIAGNOSIS — J432 Centrilobular emphysema: Secondary | ICD-10-CM | POA: Diagnosis not present

## 2024-03-04 DIAGNOSIS — R490 Dysphonia: Secondary | ICD-10-CM | POA: Diagnosis not present

## 2024-03-04 DIAGNOSIS — I2510 Atherosclerotic heart disease of native coronary artery without angina pectoris: Secondary | ICD-10-CM

## 2024-03-04 DIAGNOSIS — D638 Anemia in other chronic diseases classified elsewhere: Secondary | ICD-10-CM | POA: Diagnosis not present

## 2024-03-04 DIAGNOSIS — R634 Abnormal weight loss: Secondary | ICD-10-CM

## 2024-03-04 DIAGNOSIS — N1832 Chronic kidney disease, stage 3b: Secondary | ICD-10-CM | POA: Diagnosis not present

## 2024-03-04 NOTE — Progress Notes (Signed)
 Patient ID: Brandon Taylor, male    DOB: [birth year]  MRN: [id number]  CC: Hypertension (HTN f/u./No questions / concerns)   Subjective: Brandon Taylor is a 83 y.o. male who presents for chronic ds management. Wife is with him. His concerns today include:  history of A.Fib, GERD, Hx of ETOH abuse, subacute thyroiditis resolved and off medication, dementia, vitamin B12 deficiency, HL, HTN, CKD 3, ACD, UACS with elev  IgE (Dr. Waymond Hailey - recommended referral to allergist but pt declined).   Discussed the use of AI scribe software for clinical note transcription with the patient, who gave verbal consent to proceed.  History of Present Illness Brandon Taylor is an 83 year old male who presents for follow-up on weight loss and hoarseness. He is accompanied by his wife, Brandon Taylor.  He has gained five pounds since his last visit, now weighing 124 pounds. His appetite has improved, and he is trying to eat three meals a day. He consumes more than four to eight glasses of water daily. He snacks on fruits like bananas and grapes to increase calorie intake. He has a past history of smoking, with CT scan findings of emphysema and arterial plaque. Denies SOB or CP. CT abd/pelvis showed abnormal masses.  Hoarseness persists.  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 .  laryngoscopy showed a 2 to 3 mm glottic gap, indicating that the vocal cords are not making sufficient contact to produce sound effectively. No polyps, nodules, or tumors were observed. Allergies and postnasal drip may exacerbate the hoarseness. He was advised to increase his food intake to address his underweight condition. He declined speech therapy and hyaluronic acid injections. Pollen affects his hoarseness, but his breathing has improved.  He has chronic kidney disease and was seen by nephrologist Dr. Zelda Hickman last mth.  GFR is in the 30s,  Alb/creat ration was 163 and PTH level elev in the 90s.  He has stable anemia with Hb 11.6-12.5. He takes iron supplements twice a week. He is on pravastatin  for cholesterol and metoprolol  for blood pressure. His blood pressure was elevated today, and he took his morning medication. Thinks BP elev today because he ate some fat back last evening      Patient Active Problem List   Diagnosis Date Noted   Upper airway cough syndrome 11/06/2023   History of hyperthyroidism 07/31/2023   Moderate protein-calorie malnutrition (HCC) 11/28/2022   PAF (paroxysmal atrial fibrillation) (HCC) 07/28/2022   Hyperthyroidism 07/28/2022   Atrial fibrillation with RVR (HCC) 06/28/2022   Pneumonia due to COVID-19 virus 06/28/2022   Stage 3b chronic kidney disease (HCC) 12/04/2020   Anemia, chronic disease 09/28/2019   Mixed hyperlipidemia 07/20/2017   Mild dementia (HCC) 05/29/2017   Surgical site reaction 12/06/2015   Routine general medical examination at a health care facility 10/24/2014   Vitamin B12 deficiency 10/03/2013   Loss of weight 10/03/2013   Alcohol abuse 10/03/2013   Nicotine abuse 10/03/2013   Hypoglycemia 09/22/2013   General weakness 09/22/2013   GERD (gastroesophageal reflux disease)    ETOH abuse      Current Outpatient Medications on File Prior to Visit  Medication Sig Dispense Refill   albuterol  (VENTOLIN  HFA) 108 (90 Base) MCG/ACT inhaler INHALE 2 PUFFS INTO THE LUNGS EVERY 6 (SIX) HOURS AS NEEDED FOR WHEEZING (AS NEEDED FOR WHEEZING/COUGH). 8.5 each 0   [facility] VITAMIN B12 1000  MCG tablet TAKE 1 TABLET BY MOUTH EVERY DAY (Patient taking differently: Take 1,000 mcg by mouth daily.) 100 tablet 0   D-1000 EXTRA STRENGTH 25 MCG (1000 UT) tablet TAKE 1 TABLET BY MOUTH EVERY DAY 90 tablet 1   ferrous sulfate  325 (65 FE) MG tablet TAKE 1 TABLET BY MOUTH 2 TIMES A WEEK 24 tablet 1   fluticasone  (FLONASE ) 50 MCG/ACT nasal spray Place 1 spray into both nostrils daily. 16 g 1   latanoprost  (XALATAN) 0.005 % ophthalmic solution Place 1 drop into both eyes at bedtime as needed.     metoprolol  tartrate (LOPRESSOR ) 25 MG tablet TAKE 1 TABLET BY MOUTH TWICE A DAY 180 tablet 1   pantoprazole  (PROTONIX ) 40 MG tablet TAKE 1 TABLET BY MOUTH EVERY DAY 90 tablet 1   pravastatin  (PRAVACHOL ) 20 MG tablet TAKE 1 TABLET BY MOUTH EVERY DAY 90 tablet 1   No current facility-administered medications on file prior to visit.    No Known Allergies  Social History   Socioeconomic History   Marital status: Married    Spouse name: Not on file   Number of children: Not on file   Years of education: Not on file   Highest education level: Not on file  Occupational History   Not on file  Tobacco Use   Smoking status: Former    Current packs/day: 0.00    Average packs/day: 2.0 packs/day for 40.0 years (80.0 ttl pk-yrs)    Types: Cigarettes    Start date: 10/13/1972    Quit date: 10/13/2012    Years since quitting: 11.3   Smokeless tobacco: Never  Substance and Sexual Activity   Alcohol use: Not Currently    Comment: fifth daily   Drug use: No   Sexual activity: Not on file  Other Topics Concern   Not on file  Social History Narrative   Pt lives in 1 story home with his wife   Has 2 adult daughters - live here in [place]. Attends church weekly.    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[employer] (?)   Social Drivers of [employer] Strain: Medium Risk (08/06/2023)   Overall Financial Resource Strain (CARDIA)    Difficulty of Paying Living Expenses: Somewhat hard  Food Insecurity: No Food Insecurity (08/06/2023)   Hunger Vital Sign    Worried About Running Out of Food in the Last Year: Never true    Ran Out of Food in the Last Year: Never true  Transportation Needs: No Transportation Needs (08/06/2023)   PRAPARE - Administrator, Civil Service (Medical): No    Lack of Transportation (Non-Medical): No  Physical  Activity: Sufficiently Active (08/06/2023)   Exercise Vital Sign    Days of Exercise per Week: 5 days    Minutes of Exercise per Session: 60 min  Stress: No Stress Concern Present (08/06/2023)   [employer] of Occupational Health - Occupational Stress Questionnaire    Feeling of Stress : Not at all  Social Connections: Moderately Integrated (08/06/2023)   Social Connection and Isolation Panel [NHANES]    Frequency of Communication with Friends and Family: Twice a week    Frequency of Social Gatherings with Friends and Family: More than three times a week    Attends Religious Services: Never    Database administrator or Organizations: Yes    Attends Banker Meetings: Never    Marital Status:  Married  Intimate Partner Violence: Not At Risk (08/06/2023)   Humiliation, Afraid, Rape, and Kick questionnaire    Fear of Current or Ex-Partner: No    Emotionally Abused: No    Physically Abused: No    Sexually Abused: No    Family History  Problem Relation Age of Onset   COPD Brother     Past Surgical History:  Procedure Laterality Date   NO PAST SURGERIES      ROS: Review of Systems Negative except as stated above  PHYSICAL EXAM: BP (!) 176/83 (BP Location: Left Arm, Patient Position: Sitting)   Pulse (!) 59   Temp 98.4 F (36.9 C) (Oral)   Ht 5\' 9"  (1.753 m)   Wt 124 lb (56.2 kg)   SpO2 98%   BMI 18.31 kg/m   Wt Readings from Last 3 Encounters:  03/04/24 124 lb (56.2 kg)  12/07/23 119 lb (54 kg)  11/06/23 128 lb (58.1 kg)    Physical Exam   General appearance - elderly AAM in NAD, he appears underweight for height with temporal wasting.  Audible hoarsness Mental status - quiet demeanor, he answers most questions to the best of his ability but is somewhat forgetful Mouth - mucous membranes moist, pharynx normal without lesions Neck - supple, no significant adenopathy Chest -breath sounds are decreased but no wheezes crackles or rhonchi Heart -  normal rate, regular rhythm, normal S1, S2, no murmurs, rubs, clicks or gallops Extremities - peripheral pulses normal, no pedal edema, no clubbing or cyanosis     Latest Ref Rng & Units 12/07/2023    9:57 AM 04/06/2023    3:42 PM 10/27/2022   12:45 PM  CMP  Glucose 70 - 99 mg/dL 161  80  91   BUN 8 - 27 mg/dL 23  22  16    Creatinine 0.76 - 1.27 mg/dL 0.96  0.45  4.09   Sodium 134 - 144 mmol/L 142  140  143   Potassium 3.5 - 5.2 mmol/L 5.0  4.5  4.3   Chloride 96 - 106 mmol/L 106  103  105   CO2 20 - 29 mmol/L 20  23  26    Calcium 8.6 - 10.2 mg/dL 9.4  9.2  9.1   Total Protein 6.0 - 8.5 g/dL 6.9  7.4    Total Bilirubin 0.0 - 1.2 mg/dL 0.4  0.2    Alkaline Phos 44 - 121 IU/L 117  103    AST 0 - 40 IU/L 19  19    ALT 0 - 44 IU/L 9  8     Lipid Panel     Component Value Date/Time   CHOL 201 (H) 12/03/2020 1629   TRIG 72 12/03/2020 1629   HDL 83 12/03/2020 1629   CHOLHDL 2.4 12/03/2020 1629   CHOLHDL 2.5 01/02/2014 0947   VLDL 14 01/02/2014 0947   LDLCALC 105 (H) 12/03/2020 1629    CBC    Component Value Date/Time   WBC 5.6 11/06/2023 1008   RBC 4.22 11/06/2023 1008   HGB 12.5 (L) 11/06/2023 1008   HGB 12.1 (L) 04/06/2023 1542   HCT 38.4 (L) 11/06/2023 1008   HCT 37.2 (L) 04/06/2023 1542   PLT 214.0 11/06/2023 1008   PLT 235 04/06/2023 1542   MCV 91.1 11/06/2023 1008   MCV 88 04/06/2023 1542   MCH 28.7 04/06/2023 1542   MCH 29.2 06/30/2022 0926   MCHC 32.5 11/06/2023 1008   RDW 14.5 11/06/2023 1008   RDW  13.0 04/06/2023 1542   LYMPHSABS 1.1 11/06/2023 1008   LYMPHSABS 1.2 05/02/2021 1140   MONOABS 0.6 11/06/2023 1008   EOSABS 0.0 11/06/2023 1008   EOSABS 0.0 05/02/2021 1140   BASOSABS 0.0 11/06/2023 1008   BASOSABS 0.0 05/02/2021 1140    ASSESSMENT AND PLAN: 1. Unintended weight loss (Primary) Weight increased by 5 pounds. Appetite improved. Insurance does not cover nutritional supplements per his wife. - Encourage continued increased food intake. - Advise  snacking on fruits like bananas and grapes for additional calories.  2. Hoarseness Hoarseness likely due to presbylarynx with thin vocal cords, exacerbated by allergies and postnasal drip. ENT evaluation negative for polyps, nodules, or tumors. Declined speech therapy and hyaluronic acid injection. - Encourage increased food intake to address underweight condition.  3. Stage 3b chronic kidney disease (HCC) Chronic kidney disease likely due to hypertension and renal vascular issues. Plugged in with nephrology - Follow up with nephrologist in six months.  4. Coronary artery calcification of native artery Coronary artery plaque found on CT. Asymptomatic. On pravastatin  and metoprolol . Declined advanced cardiac procedures. - Continue pravastatin . - Continue metoprolol . - Monitor for symptoms of chest pain or dyspnea.  5. Anemia, chronic disease Likely associated with CKD.  We will continue to monitor.  He takes iron supplement twice a week.  6. Centrilobular emphysema (HCC) Seen on imaging studies with prior history of tobacco use.  Asymptomatic.    Patient was given the opportunity to ask questions.  Patient verbalized understanding of the plan and was able to repeat key elements of the plan.   This documentation was completed using Paediatric nurse.  Any transcriptional errors are unintentional.  No orders of the defined types were placed in this encounter.    Requested Prescriptions    No prescriptions requested or ordered in this encounter    No follow-ups on file.  Concetta Dee, MD, FACP

## 2024-03-04 NOTE — Patient Instructions (Signed)
 VISIT SUMMARY:  Today, we discussed your recent weight gain and persistent hoarseness. You have gained five pounds since your last visit, and your appetite has improved. Your hoarseness continues, but a laryngoscopy showed no serious issues. We also reviewed your chronic kidney disease, hypertension, and atherosclerosis. Your blood pressure was elevated today, and we discussed your medication adherence and sodium intake. We also talked about your goals of care, including your preference for non-invasive management of your heart condition.  YOUR PLAN:  -CHRONIC KIDNEY DISEASE: Chronic kidney disease means your kidneys are not working as well as they should, likely due to high blood pressure and issues with the blood vessels in your kidneys. We will check your labs and urine today and follow up with your kidney specialist in six months.  -HYPERTENSION: Hypertension means high blood pressure. Your blood pressure was elevated today, possibly due to missing your medication or high sodium intake. We will recheck your blood pressure today and schedule a follow-up with the clinical pharmacist in 2-3 weeks. Please try to limit your sodium intake.  -ATHEROSCLEROSIS: Atherosclerosis is the buildup of plaque in your arteries. You are currently taking pravastatin  and metoprolol  to manage this condition. Please continue these medications and monitor for any symptoms like chest pain or shortness of breath.  -UNDERWEIGHT: Being underweight means you weigh less than is healthy for your height. You have gained 5 pounds, and your appetite has improved. Please continue to eat more food, snack on fruits like bananas and grapes, and drink 4-8 glasses of water daily.  -HOARSENESS DUE TO PRESBYLARYNX: Presbylarynx means age-related changes in your vocal cords, making them thinner and causing hoarseness. Allergies and postnasal drip may also contribute. Please continue to eat well to help with your overall  health.  INSTRUCTIONS:  Please follow up with the nephrologist in six months. Recheck your blood pressure today and schedule a follow-up with the clinical pharmacist in 2-3 weeks. Continue to monitor for any symptoms of chest pain or shortness of breath and seek medical attention if they occur.

## 2024-03-05 ENCOUNTER — Encounter: Payer: Self-pay | Admitting: Internal Medicine

## 2024-03-05 DIAGNOSIS — I2510 Atherosclerotic heart disease of native coronary artery without angina pectoris: Secondary | ICD-10-CM | POA: Insufficient documentation

## 2024-03-30 ENCOUNTER — Other Ambulatory Visit: Payer: Self-pay | Admitting: Internal Medicine

## 2024-03-30 DIAGNOSIS — E785 Hyperlipidemia, unspecified: Secondary | ICD-10-CM

## 2024-04-05 ENCOUNTER — Ambulatory Visit: Attending: Internal Medicine | Admitting: Pharmacist

## 2024-04-05 ENCOUNTER — Encounter: Payer: Self-pay | Admitting: Pharmacist

## 2024-04-05 VITALS — BP 128/62

## 2024-04-05 DIAGNOSIS — I10 Essential (primary) hypertension: Secondary | ICD-10-CM | POA: Diagnosis not present

## 2024-04-05 NOTE — Progress Notes (Signed)
   S:     No chief complaint on file.  83 y.o. male who presents for hypertension evaluation, education, and management.  PMH is significant for history of A.Fib, GERD, Hx of ETOH abuse, subacute thyroiditis resolved and off medication, dementia, vitamin B12 deficiency, HL, HTN, CKD 3, ACD. SABRA  Patient was referred and last seen by Primary Care Provider, Dr. Vicci, on 03/04/2024. BP was 165/78 mmHg at that visit. He also sees [facility] (Dr. Dennise) with his last visit with them in April.  At that visit with his PCP last month, pt admitted to consuming a sodium-rich meal the evening prior to his visit. He was referred to me for a BP recheck. He tells me today he has cut back on his sodium since his last visit. Of note, BP with Dr. Vicci in Feb was at goal.    Today, patient arrives in good spirits and presents without assistance. Denies dizziness, headache, blurred vision, swelling. Patient reports hypertension is longstanding.   Family/Social history:  Fhx: no pertinent positives.  Tobacco: former smoker Alcohol: none reported  Medication adherence reported. Patient has taken BP medications today.   Current antihypertensives include: metoprolol  tartrate 25 mg BID  Reported home BP readings: none given  Patient reported dietary habits:  -Tries to limit sodium  -Does not drink excessive amounts of caffeine   Patient-reported exercise habits: none given   O:  Vitals:   04/05/24 1430  BP: 128/62    Last 3 Office BP readings: BP Readings from Last 3 Encounters:  04/05/24 128/62  03/04/24 (!) 165/78  12/07/23 114/66    BMET    Component Value Date/Time   NA 142 12/07/2023 0957   K 5.0 12/07/2023 0957   CL 106 12/07/2023 0957   CO2 20 12/07/2023 0957   GLUCOSE 105 (H) 12/07/2023 0957   GLUCOSE 148 (H) 06/30/2022 0926   BUN 23 12/07/2023 0957   CREATININE 1.76 (H) 12/07/2023 0957   CREATININE 1.19 10/24/2014 1114   CALCIUM 9.4 12/07/2023 0957   GFRNONAA 33 (L)  06/30/2022 0926   GFRNONAA 60 10/24/2014 1114   GFRAA 46 (L) 12/03/2020 1629   GFRAA 69 10/24/2014 1114    Renal function: CrCl cannot be calculated (Patient's most recent lab result is older than the maximum 21 days allowed.).  Clinical ASCVD: No  The ASCVD Risk score (Arnett DK, et al., 2019) failed to calculate for the following reasons:   The 2019 ASCVD risk score is only valid for ages 49 to 53  Patient is participating in a Managed Medicaid Plan: no     A/P: Hypertension diagnosed currently at goal on current medications. BP goal < 130/80 mmHg. Medication adherence appears appropriate.  -Continued current medication regimen.  -Patient educated on purpose, proper use, and potential adverse effects of metoprolol .  -F/u labs ordered - none -Counseled on lifestyle modifications for blood pressure control including reduced dietary sodium, increased exercise, adequate sleep. -Encouraged patient to check BP at home and bring log of readings to next visit. Counseled on proper use of home BP cuff.   Results reviewed and written information provided.    Written patient instructions provided. Patient verbalized understanding of treatment plan.  Total time in face to face counseling 15 minutes.    Follow-up:  Pharmacist prn. PCP clinic visit 07/05/2024.   Herlene Fleeta Morris, PharmD, JAQUELINE, CPP Clinical Pharmacist [facility] [phone]

## 2024-05-20 ENCOUNTER — Other Ambulatory Visit: Payer: Self-pay | Admitting: Internal Medicine

## 2024-07-05 ENCOUNTER — Ambulatory Visit: Attending: Internal Medicine | Admitting: Internal Medicine

## 2024-07-05 ENCOUNTER — Encounter: Payer: Self-pay | Admitting: Internal Medicine

## 2024-07-05 VITALS — BP 145/73 | HR 63 | Temp 97.8°F | Ht 69.0 in | Wt 124.0 lb

## 2024-07-05 DIAGNOSIS — K219 Gastro-esophageal reflux disease without esophagitis: Secondary | ICD-10-CM | POA: Diagnosis not present

## 2024-07-05 DIAGNOSIS — D638 Anemia in other chronic diseases classified elsewhere: Secondary | ICD-10-CM

## 2024-07-05 DIAGNOSIS — J3089 Other allergic rhinitis: Secondary | ICD-10-CM | POA: Diagnosis not present

## 2024-07-05 DIAGNOSIS — N1832 Chronic kidney disease, stage 3b: Secondary | ICD-10-CM | POA: Diagnosis not present

## 2024-07-05 DIAGNOSIS — I10 Essential (primary) hypertension: Secondary | ICD-10-CM

## 2024-07-05 DIAGNOSIS — R490 Dysphonia: Secondary | ICD-10-CM | POA: Diagnosis not present

## 2024-07-05 DIAGNOSIS — E785 Hyperlipidemia, unspecified: Secondary | ICD-10-CM | POA: Diagnosis not present

## 2024-07-05 DIAGNOSIS — Z23 Encounter for immunization: Secondary | ICD-10-CM | POA: Diagnosis not present

## 2024-07-05 MED ORDER — METOPROLOL TARTRATE 25 MG PO TABS: 25.0000 mg | ORAL_TABLET | Freq: Two times a day (BID) | ORAL | 1 refills | Status: AC

## 2024-07-05 MED ORDER — PANTOPRAZOLE SODIUM 40 MG PO TBEC: 40.0000 mg | DELAYED_RELEASE_TABLET | Freq: Every day | ORAL | 1 refills | Status: DC

## 2024-07-05 MED ORDER — AMLODIPINE BESYLATE 2.5 MG PO TABS: 2.5000 mg | ORAL_TABLET | Freq: Every day | ORAL | 1 refills | Status: AC

## 2024-07-05 NOTE — Patient Instructions (Signed)
  VISIT SUMMARY: Today, you came in for a routine follow-up appointment. We discussed your ongoing health issues, including hypertension, chronic kidney disease, anemia, and hoarseness. You reported occasional difficulty swallowing and leg swelling. Your weight has remained stable, and you are managing your cholesterol and anemia with medication. We reviewed your current medications and made some adjustments to better manage your conditions.  YOUR PLAN: -HYPERTENSION: Your blood pressure is still high at 167/82 mmHg despite taking metoprolol . We are adding a new medication, amlodipine , to help lower your blood pressure. Please take this once daily in the morning. A prescription has been sent to [facility].  -CHRONIC KIDNEY DISEASE: Your kidney function is not optimal. We will recheck your kidney function with blood tests to monitor your condition.  -ANEMIA: You have anemia, which means you have a lower than normal number of red blood cells. We will recheck your anemia with blood tests to ensure it is being managed properly.  -HYPERLIPIDEMIA: Your cholesterol levels are being managed with pravastatin . We will recheck your cholesterol levels with blood tests to ensure they are within a healthy range.  -HOARSENESS AND DYSPHAGIA: Your hoarseness is likely due to thin vocal cords and postnasal drip, and you have occasional difficulty swallowing. We encourage you to gain some weight, as this may help improve your vocal cord function.  -ALLERGIC RHINITIS: Your allergies are contributing to your hoarseness. We recommend switching to Claritin, an over-the-counter allergy medication, and continuing to use Flonase  nasal spray.  INSTRUCTIONS: Please follow up with the blood tests for your kidney function, anemia, and cholesterol levels. Continue taking your medications as prescribed and start the new medication, amlodipine , as directed. If you have any new symptoms or concerns, please contact our  office.                      Contains text generated by [employer].                                 Contains text generated by [employer]bridge.

## 2024-07-05 NOTE — Progress Notes (Signed)
 Patient ID: Brandon Taylor, male    DOB: 1941/07/30  MRN: [id number]  CC: Chronic disease mangement (Follow-up. Med refills. /No questions / concerns/Yes to flu vax)   Subjective: Brandon Taylor is a 83 y.o. male who presents for chronic ds management. Wife is with him. His concerns today include:  history of A.Fib, GERD, Hx of ETOH abuse, subacute thyroiditis resolved and off medication, dementia, vitamin B12 deficiency, HL, HTN, CKD 3, ACD, UACS with elev  IgE (Dr. Darlean - recommended referral to allergist but pt declined).   Discussed the use of AI scribe software for clinical note transcription with the patient, who gave verbal consent to proceed.  History of Present Illness   Brandon Taylor is an 83 year old male with hypertension, chronic kidney disease, and anemia who presents for a routine follow-up. He is accompanied by his wife.  He experiences ongoing hoarseness, which he attributes to pollen exposure/allergies. He reports occasional and sometimes frequent difficulty swallowing, but does not feel that food or liquids get stuck in chest or throat. No coughing when swallowing. Had seen ENT specialist at [facility] earlier this yr. Laryngoscopy showed a 2 to 3 mm glottic gap, indicating that the vocal cords are not making sufficient contact to produce sound effectively. No polyps, nodules, or tumors were observed. Allergies and postnasal drip may exacerbate the hoarseness. He was advised to increase his food intake to address his underweight condition. He declined speech therapy and hyaluronic acid injections. He uses Flonase  nasal spray and Mucinex  for allergies.  HTN: He is on metoprolol  25 mg twice daily for hypertension and confirms taking his morning dose before the visit. He does not currently monitor his blood pressure at home. No chest pain or shortness of breath, but there is occasional leg swelling, noticed last night. He limits salt intake, using sea salt sparingly.  Wgh Concerns: His  weight has remained stable at 124 pounds over the past four months. He mentions eating more frequently to maintain his weight.  He continues to take pravastatin  daily for cholesterol management.   CKD/chronic anemia: He last saw a kidney specialist in April, and his kidney function is stable; GFR in the upper 30s. He also takes ferrous sulfate  for anemia and confirms adherence to this medication regimen. Hb has remained stable in the 12s   Patient Active Problem List   Diagnosis Date Noted   Coronary artery calcification of native artery 03/05/2024   Upper airway cough syndrome 11/06/2023   History of hyperthyroidism 07/31/2023   Moderate protein-calorie malnutrition 11/28/2022   PAF (paroxysmal atrial fibrillation) (HCC) 07/28/2022   Hyperthyroidism 07/28/2022   Atrial fibrillation with RVR (HCC) 06/28/2022   Pneumonia due to COVID-19 virus 06/28/2022   Stage 3b chronic kidney disease (HCC) 12/04/2020   Anemia, chronic disease 09/28/2019   Mixed hyperlipidemia 07/20/2017   Mild dementia (HCC) 05/29/2017   Surgical site reaction 12/06/2015   Routine general medical examination at a health care facility 10/24/2014   Vitamin B12 deficiency 10/03/2013   Loss of weight 10/03/2013   Alcohol abuse 10/03/2013   Nicotine abuse 10/03/2013   Hypoglycemia 09/22/2013   General weakness 09/22/2013   GERD (gastroesophageal reflux disease)    ETOH abuse      Current Outpatient Medications on File Prior to Visit  Medication Sig Dispense Refill   albuterol  (VENTOLIN  HFA) 108 (90 Base) MCG/ACT inhaler INHALE 2 PUFFS INTO THE LUNGS EVERY 6 (SIX) HOURS AS NEEDED FOR WHEEZING (AS NEEDED FOR WHEEZING/COUGH). 8.5  each 0   [facility] VITAMIN B12 1000 MCG tablet TAKE 1 TABLET BY MOUTH EVERY DAY (Patient taking differently: Take 1,000 mcg by mouth daily.) 100 tablet 0   D-1000 EXTRA STRENGTH 25 MCG (1000 UT) tablet TAKE 1 TABLET BY MOUTH EVERY DAY 90 tablet 1   ferrous sulfate  325 (65 FE) MG tablet TAKE 1  TABLET BY MOUTH 2 TIMES A WEEK 24 tablet 1   fluticasone  (FLONASE ) 50 MCG/ACT nasal spray Place 1 spray into both nostrils daily. 16 g 1   latanoprost (XALATAN) 0.005 % ophthalmic solution Place 1 drop into both eyes at bedtime as needed.     metoprolol  tartrate (LOPRESSOR ) 25 MG tablet TAKE 1 TABLET BY MOUTH TWICE A DAY 180 tablet 1   pantoprazole  (PROTONIX ) 40 MG tablet TAKE 1 TABLET BY MOUTH EVERY DAY 90 tablet 1   pravastatin  (PRAVACHOL ) 20 MG tablet TAKE 1 TABLET BY MOUTH EVERY DAY 90 tablet 1   No current facility-administered medications on file prior to visit.    No Known Allergies  Social History   Socioeconomic History   Marital status: Married    Spouse name: Not on file   Number of children: Not on file   Years of education: Not on file   Highest education level: Not on file  Occupational History   Not on file  Tobacco Use   Smoking status: Former    Current packs/day: 0.00    Average packs/day: 2.0 packs/day for 40.0 years (80.0 ttl pk-yrs)    Types: Cigarettes    Start date: 10/13/1972    Quit date: 10/13/2012    Years since quitting: 11.7   Smokeless tobacco: Never  Substance and Sexual Activity   Alcohol use: Not Currently    Comment: fifth daily   Drug use: No   Sexual activity: Not on file  Other Topics Concern   Not on file  Social History Narrative   Pt lives in 1 story home with his wife   Has 2 adult daughters - live here in [place]. Attends church weekly.    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[employer] (?)   Social Drivers of [employer] Strain: Medium Risk (08/06/2023)   Overall Financial Resource Strain (CARDIA)    Difficulty of Paying Living Expenses: Somewhat hard  Food Insecurity: No Food Insecurity (08/06/2023)   Hunger Vital Sign    Worried About Running Out of Food in the Last Year: Never true    Ran Out of Food in the Last Year: Never true  Transportation Needs: No  Transportation Needs (08/06/2023)   PRAPARE - Administrator, Civil Service (Medical): No    Lack of Transportation (Non-Medical): No  Physical Activity: Sufficiently Active (08/06/2023)   Exercise Vital Sign    Days of Exercise per Week: 5 days    Minutes of Exercise per Session: 60 min  Stress: No Stress Concern Present (08/06/2023)   [employer] of Occupational Health - Occupational Stress Questionnaire    Feeling of Stress : Not at all  Social Connections: Moderately Integrated (08/06/2023)   Social Connection and Isolation Panel    Frequency of Communication with Friends and Family: Twice a week    Frequency of Social Gatherings with Friends and Family: More than three times a week    Attends Religious Services: Never    Database administrator or Organizations: Yes    Attends Banker  Meetings: Never    Marital Status: Married  Catering manager Violence: Not At Risk (08/06/2023)   Humiliation, Afraid, Rape, and Kick questionnaire    Fear of Current or Ex-Partner: No    Emotionally Abused: No    Physically Abused: No    Sexually Abused: No    Family History  Problem Relation Age of Onset   COPD Brother     Past Surgical History:  Procedure Laterality Date   NO PAST SURGERIES      ROS: Review of Systems Negative except as stated above  PHYSICAL EXAM: BP (!) 145/73 (BP Location: Left Arm, Patient Position: Sitting, Cuff Size: Small)   Pulse 63   Temp 97.8 F (36.6 C) (Oral)   Ht 5' 9 (1.753 m)   Wt 124 lb (56.2 kg)   SpO2 99%   BMI 18.31 kg/m   Wt Readings from Last 3 Encounters:  07/05/24 124 lb (56.2 kg)  03/04/24 124 lb (56.2 kg)  12/07/23 119 lb (54 kg)    Physical Exam  General appearance - elderly AAM in NAD. Pt with mild to moderate hoarsiness unchanged from previous. Mental status - pt is alert, answers most questions appropriately though gives conflicting answers about whether he ahs  Eyes - arcus senilis Mouth -  mucous membranes moist, pharynx normal without lesions Lymphatics - no Cervical or axillary LN Chest - clear to auscultation, no wheezes, rales or rhonchi, symmetric air entry Heart - normal rate, regular rhythm, normal S1, S2, no murmurs, rubs, clicks or gallops Extremities - peripheral pulses normal, no pedal edema, no clubbing or cyanosis     Latest Ref Rng & Units 12/07/2023    9:57 AM 04/06/2023    3:42 PM 10/27/2022   12:45 PM  CMP  Glucose 70 - 99 mg/dL 894  80  91   BUN 8 - 27 mg/dL 23  22  16    Creatinine 0.76 - 1.27 mg/dL 8.23  8.35  8.43   Sodium 134 - 144 mmol/L 142  140  143   Potassium 3.5 - 5.2 mmol/L 5.0  4.5  4.3   Chloride 96 - 106 mmol/L 106  103  105   CO2 20 - 29 mmol/L 20  23  26    Calcium 8.6 - 10.2 mg/dL 9.4  9.2  9.1   Total Protein 6.0 - 8.5 g/dL 6.9  7.4    Total Bilirubin 0.0 - 1.2 mg/dL 0.4  0.2    Alkaline Phos 44 - 121 IU/L 117  103    AST 0 - 40 IU/L 19  19    ALT 0 - 44 IU/L 9  8     Lipid Panel     Component Value Date/Time   CHOL 201 (H) 12/03/2020 1629   TRIG 72 12/03/2020 1629   HDL 83 12/03/2020 1629   CHOLHDL 2.4 12/03/2020 1629   CHOLHDL 2.5 01/02/2014 0947   VLDL 14 01/02/2014 0947   LDLCALC 105 (H) 12/03/2020 1629    CBC    Component Value Date/Time   WBC 5.6 11/06/2023 1008   RBC 4.22 11/06/2023 1008   HGB 12.5 (L) 11/06/2023 1008   HGB 12.1 (L) 04/06/2023 1542   HCT 38.4 (L) 11/06/2023 1008   HCT 37.2 (L) 04/06/2023 1542   PLT 214.0 11/06/2023 1008   PLT 235 04/06/2023 1542   MCV 91.1 11/06/2023 1008   MCV 88 04/06/2023 1542   MCH 28.7 04/06/2023 1542   MCH 29.2 06/30/2022 0926  MCHC 32.5 11/06/2023 1008   RDW 14.5 11/06/2023 1008   RDW 13.0 04/06/2023 1542   LYMPHSABS 1.1 11/06/2023 1008   LYMPHSABS 1.2 05/02/2021 1140   MONOABS 0.6 11/06/2023 1008   EOSABS 0.0 11/06/2023 1008   EOSABS 0.0 05/02/2021 1140   BASOSABS 0.0 11/06/2023 1008   BASOSABS 0.0 05/02/2021 1140    ASSESSMENT AND PLAN: 1. Essential  hypertension (Primary) Not at goal. Continue Metoprolol . Add low dose Norvasc  2.5 mg daily Continue low salt diet - metoprolol  tartrate (LOPRESSOR ) 25 MG tablet; Take 1 tablet (25 mg total) by mouth 2 (two) times daily.  Dispense: 180 tablet; Refill: 1 - amLODipine  (NORVASC ) 2.5 MG tablet; Take 1 tablet (2.5 mg total) by mouth daily.  Dispense: 90 tablet; Refill: 1 - CBC  2. Hyperlipidemia, unspecified hyperlipidemia type Continue Pravachol  - Lipid panel  3. Chronic hoarseness Patient with chronic hoarseness that has been evaluated by ENT.  He is still not interested in speech therapy or hyaluronic acid injections as was recommended by ENT.  Advised to continue Flonase  nasal spray as needed.  Recommend Claritin over-the-counter and take as needed.  Patient's history is somewhat contradictory when asked about dysphagia/problems swallowing.  I recommend getting a barium swallow but patient declines at this time.  4. Non-seasonal allergic rhinitis due to other allergic trigger See #3 above  5. Stage 3b chronic kidney disease (HCC) Stable. No NSAIDs - Comprehensive metabolic panel with GFR  6. Anemia, chronic disease Stable and associated this CKD.  - CBC  7. Gastroesophageal reflux disease RF Protonix  - pantoprazole  (PROTONIX ) 40 MG tablet; Take 1 tablet (40 mg total) by mouth daily.  Dispense: 90 tablet; Refill: 1  8. Need for influenza vaccination Given today  Patient was given the opportunity to ask questions.  Patient verbalized understanding of the plan and was able to repeat key elements of the plan.   This documentation was completed using Paediatric nurse.  Any transcriptional errors are unintentional.  Orders Placed This Encounter  Procedures   Flu vaccine HIGH DOSE PF(Fluzone Trivalent)     Requested Prescriptions   Pending Prescriptions Disp Refills   metoprolol  tartrate (LOPRESSOR ) 25 MG tablet 180 tablet 1    Sig: Take 1 tablet (25 mg total)  by mouth 2 (two) times daily.   pantoprazole  (PROTONIX ) 40 MG tablet 90 tablet 1    Sig: Take 1 tablet (40 mg total) by mouth daily.    No follow-ups on file.  Barnie Louder, MD, FACP

## 2024-07-06 ENCOUNTER — Ambulatory Visit: Payer: Self-pay | Admitting: Internal Medicine

## 2024-07-06 LAB — CBC
Hematocrit: 38.3 % (ref 37.5–51.0)
Hemoglobin: 12.4 g/dL — ABNORMAL LOW (ref 13.0–17.7)
MCH: 29.1 pg (ref 26.6–33.0)
MCHC: 32.4 g/dL (ref 31.5–35.7)
MCV: 90 fL (ref 79–97)
Platelets: 191 x10E3/uL (ref 150–450)
RBC: 4.26 x10E6/uL (ref 4.14–5.80)
RDW: 13.9 % (ref 11.6–15.4)
WBC: 5.4 x10E3/uL (ref 3.4–10.8)

## 2024-07-06 LAB — COMPREHENSIVE METABOLIC PANEL WITH GFR
ALT: 9 IU/L (ref 0–44)
AST: 21 IU/L (ref 0–40)
Albumin: 4 g/dL (ref 3.7–4.7)
Alkaline Phosphatase: 105 IU/L (ref 48–129)
BUN/Creatinine Ratio: 14 (ref 10–24)
BUN: 23 mg/dL (ref 8–27)
Bilirubin Total: 0.4 mg/dL (ref 0.0–1.2)
CO2: 20 mmol/L (ref 20–29)
Calcium: 9 mg/dL (ref 8.6–10.2)
Chloride: 110 mmol/L — ABNORMAL HIGH (ref 96–106)
Creatinine, Ser: 1.67 mg/dL — ABNORMAL HIGH (ref 0.76–1.27)
Globulin, Total: 2.5 g/dL (ref 1.5–4.5)
Glucose: 82 mg/dL (ref 70–99)
Potassium: 5.1 mmol/L (ref 3.5–5.2)
Sodium: 144 mmol/L (ref 134–144)
Total Protein: 6.5 g/dL (ref 6.0–8.5)
eGFR: 40 mL/min/1.73 — ABNORMAL LOW (ref >59)

## 2024-07-06 LAB — LIPID PANEL
Chol/HDL Ratio: 2.8 ratio (ref 0.0–5.0)
Cholesterol, Total: 183 mg/dL (ref 100–199)
HDL: 66 mg/dL (ref >39)
LDL Chol Calc (NIH): 102 mg/dL — ABNORMAL HIGH (ref 0–99)
Triglycerides: 81 mg/dL (ref 0–149)
VLDL Cholesterol Cal: 15 mg/dL (ref 5–40)

## 2024-07-12 DIAGNOSIS — Z961 Presence of intraocular lens: Secondary | ICD-10-CM | POA: Diagnosis not present

## 2024-07-12 DIAGNOSIS — H401232 Low-tension glaucoma, bilateral, moderate stage: Secondary | ICD-10-CM | POA: Diagnosis not present

## 2024-07-12 DIAGNOSIS — H04123 Dry eye syndrome of bilateral lacrimal glands: Secondary | ICD-10-CM | POA: Diagnosis not present

## 2024-07-19 DIAGNOSIS — N1832 Chronic kidney disease, stage 3b: Secondary | ICD-10-CM | POA: Diagnosis not present

## 2024-07-29 DIAGNOSIS — R809 Proteinuria, unspecified: Secondary | ICD-10-CM | POA: Diagnosis not present

## 2024-07-29 DIAGNOSIS — N1832 Chronic kidney disease, stage 3b: Secondary | ICD-10-CM | POA: Diagnosis not present

## 2024-07-29 DIAGNOSIS — N2581 Secondary hyperparathyroidism of renal origin: Secondary | ICD-10-CM | POA: Diagnosis not present

## 2024-07-29 DIAGNOSIS — D631 Anemia in chronic kidney disease: Secondary | ICD-10-CM | POA: Diagnosis not present

## 2024-07-29 DIAGNOSIS — I129 Hypertensive chronic kidney disease with stage 1 through stage 4 chronic kidney disease, or unspecified chronic kidney disease: Secondary | ICD-10-CM | POA: Diagnosis not present

## 2024-07-29 DIAGNOSIS — K219 Gastro-esophageal reflux disease without esophagitis: Secondary | ICD-10-CM | POA: Diagnosis not present

## 2024-08-01 ENCOUNTER — Other Ambulatory Visit

## 2024-08-01 ENCOUNTER — Encounter: Payer: Self-pay | Admitting: Internal Medicine

## 2024-08-01 ENCOUNTER — Ambulatory Visit: Payer: Medicare Other | Admitting: Internal Medicine

## 2024-08-01 VITALS — BP 130/78 | HR 56 | Ht 69.0 in | Wt 128.0 lb

## 2024-08-01 DIAGNOSIS — Z8639 Personal history of other endocrine, nutritional and metabolic disease: Secondary | ICD-10-CM

## 2024-08-01 LAB — T4, FREE: Free T4: 1.2 ng/dL (ref 0.8–1.8)

## 2024-08-01 LAB — T3, FREE: T3, Free: 2.6 pg/mL (ref 2.3–4.2)

## 2024-08-01 LAB — TSH: TSH: 1.62 m[IU]/L (ref 0.40–4.50)

## 2024-08-01 NOTE — Progress Notes (Unsigned)
 Name: Brandon Taylor  MRN/ DOB: [id number], 1940-10-31    Age/ Sex: 83 y.o., male    PCP: Vicci Barnie NOVAK, MD   Reason for Endocrinology Evaluation: Hyperthyroidism     Date of Initial Endocrinology Evaluation: 11/07/2022    HPI: [name] is a 83 y.o. male with a past medical history of A.Fib, GERD, Hx of ETOH abuse . The patient presented for initial endocrinology clinic visit on 11/07/2022 for consultative assistance with his Hyperthyroidism.   Pt has been noted with intermittently low TSH in 2014 at 0.191 uIU/mL and again in 06/2022 at 0.328 uIU/mL.   Pt was started on Methimazole  08/2022  Undetectable TSI 06/2022   Sister with thyroid  disease   Methimazole  was discontinued by March 2024 due to hypothyroidism   SUBJECTIVE:    Today (08/01/24): Brandon Taylor is here for follow-up on hyperthyroidism.   He has been  off methimazole  since  March 2024 He is accompanied by his spouse   Weight has been stable No local neck swelling  No tremors  No constipation or diarrhea  No palpitations     HISTORY:  Past Medical History:  Past Medical History:  Diagnosis Date   ETOH abuse    GERD (gastroesophageal reflux disease)    Past Surgical History:  Past Surgical History:  Procedure Laterality Date   NO PAST SURGERIES      Social History:  reports that he quit smoking about 11 years ago. His smoking use included cigarettes. He started smoking about 51 years ago. He has a 80 pack-year smoking history. He has never used smokeless tobacco. He reports that he does not currently use alcohol. He reports that he does not use drugs. Family History: family history includes COPD in his brother.   HOME MEDICATIONS: Allergies as of 08/01/2024   No Known Allergies      Medication List        Accurate as of August 01, 2024 10:38 AM. If you have any questions, ask your nurse or doctor.          albuterol  108 (90 Base) MCG/ACT inhaler Commonly known as: VENTOLIN   HFA INHALE 2 PUFFS INTO THE LUNGS EVERY 6 (SIX) HOURS AS NEEDED FOR WHEEZING (AS NEEDED FOR WHEEZING/COUGH).   amLODipine  2.5 MG tablet Commonly known as: NORVASC  Take 1 tablet (2.5 mg total) by mouth daily.   [facility] VITAMIN B12 1000 MCG tablet Generic drug: cyanocobalamin  TAKE 1 TABLET BY MOUTH EVERY DAY What changed: how much to take   D-1000 Extra Strength 25 MCG (1000 UT) tablet Generic drug: Cholecalciferol  TAKE 1 TABLET BY MOUTH EVERY DAY   ferrous sulfate  325 (65 FE) MG tablet TAKE 1 TABLET BY MOUTH 2 TIMES A WEEK   fluticasone  50 MCG/ACT nasal spray Commonly known as: FLONASE  Place 1 spray into both nostrils daily.   latanoprost 0.005 % ophthalmic solution Commonly known as: XALATAN Place 1 drop into both eyes at bedtime as needed.   metoprolol  tartrate 25 MG tablet Commonly known as: LOPRESSOR  Take 1 tablet (25 mg total) by mouth 2 (two) times daily.   pantoprazole  40 MG tablet Commonly known as: PROTONIX  Take 1 tablet (40 mg total) by mouth daily.   pravastatin  20 MG tablet Commonly known as: PRAVACHOL  TAKE 1 TABLET BY MOUTH EVERY DAY          REVIEW OF SYSTEMS: A comprehensive ROS was conducted with the patient and is negative except as per HPI  OBJECTIVE:  VS: BP 130/78 (BP Location: Left Arm, Patient Position: Sitting, Cuff Size: Normal)   Pulse (!) 56   Ht 5' 9 (1.753 m)   Wt 128 lb (58.1 kg)   SpO2 96%   BMI 18.90 kg/m    Wt Readings from Last 3 Encounters:  08/01/24 128 lb (58.1 kg)  07/05/24 124 lb (56.2 kg)  03/04/24 124 lb (56.2 kg)    EXAM: General: Pt appears well and is in NAD  Neck: General: Supple without adenopathy. Thyroid : Thyroid  size normal.  No goiter or nodules appreciated.   Lungs: Clear with good BS bilat   Heart: Auscultation: RRR.  Abdomen:  soft, nontender  Extremities:  BL LE: No pretibial edema   Mental Status: Judgment, insight: Intact Orientation: Oriented to time, place, and person Mood and affect:  No depression, anxiety, or agitation     DATA REVIEWED:     Latest Reference Range & Units 07/05/24 12:12  Comprehensive metabolic panel with GFR  Rpt !  Sodium 134 - 144 mmol/L 144  Potassium 3.5 - 5.2 mmol/L 5.1  Chloride 96 - 106 mmol/L 110 (H)  CO2 20 - 29 mmol/L 20  Glucose 70 - 99 mg/dL 82  BUN 8 - 27 mg/dL 23  Creatinine 9.23 - 8.72 mg/dL 8.32 (H)  Calcium 8.6 - 10.2 mg/dL 9.0  BUN/Creatinine Ratio 10 - 24  14  eGFR >59 mL/min/1.73 40 (L)  Alkaline Phosphatase 48 - 129 IU/L 105  Albumin 3.7 - 4.7 g/dL 4.0  AST 0 - 40 IU/L 21  ALT 0 - 44 IU/L 9  Total Protein 6.0 - 8.5 g/dL 6.5  !: Data is abnormal (H): Data is abnormally high (L): Data is abnormally low Rpt: View report in Results Review for more information   ASSESSMENT/PLAN/RECOMMENDATIONS:   Hyperthyroidism:  -Clinical scenario more consistent with subacute thyroiditis  -He has been off methimazole  since March 2024  - Patient is clinically euthyroid - No local neck symptoms - TFTs today***   F/U as needed     Signed electronically by: Stefano Redgie Butts, MD  [facility] End[facility]  [facility] [address] [place] [place], [place] [address] Phone: [phone] FAX: [phone]   CC: Vicci Barnie NOVAK, MD [address] [place] [address] Phone: [phone] Fax: [phone]   Return to Endocrinology clinic as below: Future Appointments  Date Time Provider Department Center  08/02/2024  2:50 PM [facility] [place] VISIT [facility]  11/04/2024 10:50 AM Vicci Barnie NOVAK, MD [facility]

## 2024-08-02 ENCOUNTER — Ambulatory Visit: Attending: Internal Medicine

## 2024-08-02 ENCOUNTER — Ambulatory Visit: Payer: Medicare Other

## 2024-08-02 ENCOUNTER — Ambulatory Visit: Payer: Self-pay | Admitting: Internal Medicine

## 2024-08-02 VITALS — Ht 69.0 in | Wt 128.0 lb

## 2024-08-02 DIAGNOSIS — Z0000 Encounter for general adult medical examination without abnormal findings: Secondary | ICD-10-CM | POA: Diagnosis not present

## 2024-08-02 NOTE — Progress Notes (Cosign Needed Addendum)
 Because this visit was a virtual/telehealth visit,  certain criteria was not obtained, such a blood pressure, CBG if applicable, and timed get up and go. Any medications not marked as taking were not mentioned during the medication reconciliation part of the visit. Any vitals not documented were not able to be obtained due to this being a telehealth visit or patient was unable to self-report a recent blood pressure reading due to a lack of equipment at home via telehealth. Vitals that have been documented are verbally provided by the patient.   Subjective:   Brandon Taylor is a 83 y.o. who presents for a Medicare Wellness preventive visit.  As a reminder, Annual Wellness Visits don't include a physical exam, and some assessments may be limited, especially if this visit is performed virtually. We may recommend an in-person follow-up visit with your provider if needed.  Visit Complete: Virtual I connected with  Brandon Taylor on 08/02/24 by a audio enabled telemedicine application and verified that I am speaking with the correct person using two identifiers.  Patient Location: Home  Provider Location: Office/Clinic  I discussed the limitations of evaluation and management by telemedicine. The patient expressed understanding and agreed to proceed.  Vital Signs: Because this visit was a virtual/telehealth visit, some criteria may be missing or patient reported. Any vitals not documented were not able to be obtained and vitals that have been documented are patient reported.  VideoDeclined- This patient declined Librarian, academic. Therefore the visit was completed with audio only.  Persons Participating in Visit: Patient.  AWV Questionnaire: No: Patient Medicare AWV questionnaire was not completed prior to this visit.  Cardiac Risk Factors include: advanced age (>43men, >54 women);male gender;hypertension;dyslipidemia     Objective:    Today's Vitals   08/02/24  1455  Weight: 128 lb (58.1 kg)  Height: 5' 9 (1.753 m)  PainSc: 0-No pain   Body mass index is 18.9 kg/m.     08/02/2024    2:56 PM 12/12/2022   10:40 AM 06/28/2022    6:00 PM 01/06/2022    9:13 AM 12/31/2020   11:44 AM 05/06/2017    1:44 PM 02/17/2017    6:00 PM  Advanced Directives  Does Patient Have a Medical Advance Directive? No No No No No No  No   Would patient like information on creating a medical advance directive? No - Patient declined No - Patient declined No - Patient declined No - Patient declined No - Patient declined  No - Patient declined      Data saved with a previous flowsheet row definition    Current Medications (verified) Outpatient Encounter Medications as of 08/02/2024  Medication Sig   albuterol  (VENTOLIN  HFA) 108 (90 Base) MCG/ACT inhaler INHALE 2 PUFFS INTO THE LUNGS EVERY 6 (SIX) HOURS AS NEEDED FOR WHEEZING (AS NEEDED FOR WHEEZING/COUGH).   amLODipine  (NORVASC ) 2.5 MG tablet Take 1 tablet (2.5 mg total) by mouth daily.   [facility] VITAMIN B12 1000 MCG tablet TAKE 1 TABLET BY MOUTH EVERY DAY (Patient taking differently: Take 1,000 mcg by mouth daily.)   D-1000 EXTRA STRENGTH 25 MCG (1000 UT) tablet TAKE 1 TABLET BY MOUTH EVERY DAY   ferrous sulfate  325 (65 FE) MG tablet TAKE 1 TABLET BY MOUTH 2 TIMES A WEEK   fluticasone  (FLONASE ) 50 MCG/ACT nasal spray Place 1 spray into both nostrils daily.   latanoprost (XALATAN) 0.005 % ophthalmic solution Place 1 drop into both eyes at bedtime as needed.  metoprolol  tartrate (LOPRESSOR ) 25 MG tablet Take 1 tablet (25 mg total) by mouth 2 (two) times daily.   pantoprazole  (PROTONIX ) 40 MG tablet Take 1 tablet (40 mg total) by mouth daily.   pravastatin  (PRAVACHOL ) 20 MG tablet TAKE 1 TABLET BY MOUTH EVERY DAY   No facility-administered encounter medications on file as of 08/02/2024.    Allergies (verified) Patient has no known allergies.   History: Past Medical History:  Diagnosis Date   ETOH abuse    GERD  (gastroesophageal reflux disease)    Past Surgical History:  Procedure Laterality Date   NO PAST SURGERIES     Family History  Problem Relation Age of Onset   COPD Brother    Social History   Socioeconomic History   Marital status: Married    Spouse name: Not on file   Number of children: Not on file   Years of education: Not on file   Highest education level: Not on file  Occupational History   Not on file  Tobacco Use   Smoking status: Former    Current packs/day: 0.00    Average packs/day: 2.0 packs/day for 40.0 years (80.0 ttl pk-yrs)    Types: Cigarettes    Start date: 10/13/1972    Quit date: 10/13/2012    Years since quitting: 11.8   Smokeless tobacco: Never  Substance and Sexual Activity   Alcohol use: Not Currently    Comment: fifth daily   Drug use: No   Sexual activity: Not on file  Other Topics Concern   Not on file  Social History Narrative   Pt lives in 1 story home with his wife   Has 2 adult daughters - live here in [place]. Attends church weekly.    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[employer] (?)   Social Drivers of [employer]: Low Risk  (08/02/2024)   Overall Financial Resource Strain (CARDIA)    Difficulty of Paying Living Expenses: Not very hard  Food Insecurity: No Food Insecurity (08/02/2024)   Hunger Vital Sign    Worried About Running Out of Food in the Last Year: Never true    Ran Out of Food in the Last Year: Never true  Transportation Needs: No Transportation Needs (08/02/2024)   PRAPARE - Administrator, Civil Service (Medical): No    Lack of Transportation (Non-Medical): No  Physical Activity: Sufficiently Active (08/02/2024)   Exercise Vital Sign    Days of Exercise per Week: 5 days    Minutes of Exercise per Session: 30 min  Stress: No Stress Concern Present (08/02/2024)   [employer] of Occupational Health - Occupational Stress Questionnaire     Feeling of Stress: Not at all  Social Connections: Moderately Integrated (08/02/2024)   Social Connection and Isolation Panel    Frequency of Communication with Friends and Family: Twice a week    Frequency of Social Gatherings with Friends and Family: More than three times a week    Attends Religious Services: Never    Database administrator or Organizations: Yes    Attends Banker Meetings: Never    Marital Status: Married    Tobacco Counseling Counseling given: Not Answered    Clinical Intake:  Pre-visit preparation completed: Yes  Pain : No/denies pain Pain Score: 0-No pain     BMI - recorded: 18.9 Nutritional Status: BMI of 19-24  Normal Nutritional Risks: None Diabetes:  No  Lab Results  Component Value Date   HGBA1C 6.1 (H) 06/28/2022   HGBA1C 5.8 (H) 02/17/2017   HGBA1C 5.90 10/24/2014     How often do you need to have someone help you when you read instructions, pamphlets, or other written materials from your doctor or pharmacy?: 1 - Never What is the last grade level you completed in school?: 12th Grade (Did not graduate)  Interpreter Needed?: No  Information entered by :: Versie Fleener N. Kadesia Robel, LPN.   Activities of Daily Living     08/02/2024    2:57 PM  In your present state of health, do you have any difficulty performing the following activities:  Hearing? 0  Vision? 0  Difficulty concentrating or making decisions? 1  Comment DEMENTIA  Walking or climbing stairs? 0  Dressing or bathing? 0  Doing errands, shopping? 1  Preparing Food and eating ? N  Using the Toilet? N  In the past six months, have you accidently leaked urine? N  Do you have problems with loss of bowel control? N  Managing your Medications? N  Managing your Finances? N  Housekeeping or managing your Housekeeping? N    Patient Care Team: Vicci Barnie NOVAK, MD as PCP - General (Internal Medicine) Francyne Headland, MD as PCP - Cardiology (Cardiology)  I have  updated your Care Teams any recent Medical Services you may have received from other providers in the past year.     Assessment:   This is a routine wellness examination for Brandon Taylor.  Hearing/Vision screen Hearing Screening - Comments:: Some hearing difficulties, no hearing aids.  Vision Screening - Comments:: No eyeglasses, not up to date with yearly eye exam.   Goals Addressed             This Visit's Progress    08/02/2024: To make it to 2026.         Depression Screen     08/02/2024    2:57 PM 03/04/2024   10:11 AM 12/07/2023    9:05 AM 08/06/2023    1:43 PM 04/06/2023    2:29 PM 11/28/2022    2:17 PM 07/28/2022    2:59 PM  PHQ 2/9 Scores  PHQ - 2 Score 0 0 0 0 0 0 0  PHQ- 9 Score 0 0 0 0 0 0 0    Fall Risk     08/02/2024    2:57 PM 03/04/2024   10:12 AM 12/07/2023    9:06 AM 11/06/2023    9:26 AM 04/06/2023    2:24 PM  Fall Risk   Falls in the past year? 0 0 0 0 0  Number falls in past yr: 0 0 0  0  Injury with Fall? 0 0 0  0  Risk for fall due to : No Fall Risks No Fall Risks No Fall Risks  No Fall Risks  Follow up Falls evaluation completed Falls evaluation completed Falls evaluation completed      MEDICARE RISK AT HOME:  Medicare Risk at Home If so, are there any without handrails?: No Home free of loose throw rugs in walkways, pet beds, electrical cords, etc?: Yes Adequate lighting in your home to reduce risk of falls?: Yes Life alert?: No Use of a cane, walker or w/c?: No Grab bars in the bathroom?: No Shower chair or bench in shower?: No Elevated toilet seat or a handicapped toilet?: Yes  TIMED UP AND GO:  Was the test performed?  No  Cognitive Function: Impaired:  Patient has current diagnosis of cognitive impairment.    08/02/2024    2:59 PM 12/12/2022   10:42 AM 01/06/2022    9:17 AM 12/31/2020   11:46 AM 12/03/2020    4:28 PM  MMSE - Mini Mental State Exam  Not completed: Unable to complete      Orientation to time  4 2 1 1   Orientation to  Place  5 4 5 5   Registration  3 0 3 3  Attention/ Calculation  0 4 0 0  Recall  2 3 3 3   Language- name 2 objects  2 2 2 2   Language- repeat  1 1 1 1   Language- follow 3 step command  3 3 3 3   Language- read & follow direction  1 1 1 1   Write a sentence  1 0 1 1  Copy design  1 0 0 0  Total score  23 20 20 20         12/12/2022   10:45 AM  6CIT Screen  What Year? 4 points  What month? 0 points  What time? 0 points    Immunizations Immunization History  Administered Date(s) Administered   Fluad Quad(high Dose 65+) 07/28/2022   Fluad Trivalent(High Dose 65+) 07/31/2023   INFLUENZA, HIGH DOSE SEASONAL PF 08/03/2018, 08/09/2019, 07/05/2024   Influenza,inj,Quad PF,6+ Mos 09/23/2013, 10/24/2014, 07/13/2017, 12/31/2020, 10/10/2021   Influenza-Unspecified 08/13/2019   Moderna Sars-Covid-2 Vaccination 11/25/2019, 12/26/2019   Pfizer Covid-19 Vaccine Bivalent Booster 36yrs & up 07/28/2022   Pneumococcal Conjugate-13 12/06/2015, 07/13/2017   Pneumococcal Polysaccharide-23 09/23/2013   Tdap 09/20/2018   Zoster Recombinant(Shingrix) 08/03/2018, 11/09/2018    Screening Tests Health Maintenance  Topic Date Due   COVID-19 Vaccine (4 - 2025-26 season) 06/13/2024   Medicare Annual Wellness (AWV)  08/02/2025   DTaP/Tdap/Td (2 - Td or Tdap) 09/20/2028   Pneumococcal Vaccine: 50+ Years  Completed   Influenza Vaccine  Completed   Zoster Vaccines- Shingrix  Completed   Meningococcal B Vaccine  Aged Out    Health Maintenance Items Addressed: Vaccines Due: Covid-19  Additional Screening:  Vision Screening: Recommended annual ophthalmology exams for early detection of glaucoma and other disorders of the eye. Is the patient up to date with their annual eye exam?  No  Who is the provider or what is the name of the office in which the patient attends annual eye exams? Defer to PCP  Dental Screening: Recommended annual dental exams for proper oral hygiene  Community Resource Referral /  Chronic Care Management: CRR required this visit?  No   CCM required this visit?  No   Plan:    I have personally reviewed and noted the following in the patient's chart:   Medical and social history Use of alcohol, tobacco or illicit drugs  Current medications and supplements including opioid prescriptions. Patient is not currently taking opioid prescriptions. Functional ability and status Nutritional status Physical activity Advanced directives List of other physicians Hospitalizations, surgeries, and ER visits in previous 12 months Vitals Screenings to include cognitive, depression, and falls Referrals and appointments  In addition, I have reviewed and discussed with patient certain preventive protocols, quality metrics, and best practice recommendations. A written personalized care plan for preventive services as well as general preventive health recommendations were provided to patient.   Brandon LOISE Fuller, LPN   [id number]   After Visit Summary: (Declined) Due to this being a telephonic visit, with patients personalized plan was offered to patient but patient Declined AVS at this  time   Notes: Nothing significant to report at this time.

## 2024-08-02 NOTE — Patient Instructions (Signed)
 Mr. Rosenberry,  Thank you for taking the time for your Medicare Wellness Visit. I appreciate your continued commitment to your health goals. Please review the care plan we discussed, and feel free to reach out if I can assist you further.  Medicare recommends these wellness visits once per year to help you and your care team stay ahead of potential health issues. These visits are designed to focus on prevention, allowing your provider to concentrate on managing your acute and chronic conditions during your regular appointments.  Please note that Annual Wellness Visits do not include a physical exam. Some assessments may be limited, especially if the visit was conducted virtually. If needed, we may recommend a separate in-person follow-up with your provider.  Ongoing Care Seeing your primary care provider every 3 to 6 months helps us  monitor your health and provide consistent, personalized care.   Referrals If a referral was made during today's visit and you haven't received any updates within two weeks, please contact the referred provider directly to check on the status.  Recommended Screenings:  Health Maintenance  Topic Date Due   COVID-19 Vaccine (4 - 2025-26 season) 06/13/2024   Medicare Annual Wellness Visit  08/02/2025   DTaP/Tdap/Td vaccine (2 - Td or Tdap) 09/20/2028   Pneumococcal Vaccine for age over 85  Completed   Flu Shot  Completed   Zoster (Shingles) Vaccine  Completed   Meningitis B Vaccine  Aged Out       08/02/2024    2:56 PM  Advanced Directives  Does Patient Have a Medical Advance Directive? No  Would patient like information on creating a medical advance directive? No - Patient declined   Advance Care Planning is important because it: Ensures you receive medical care that aligns with your values, goals, and preferences. Provides guidance to your family and loved ones, reducing the emotional burden of decision-making during critical moments.  Vision: Annual  vision screenings are recommended for early detection of glaucoma, cataracts, and diabetic retinopathy. These exams can also reveal signs of chronic conditions such as diabetes and high blood pressure.  Dental: Annual dental screenings help detect early signs of oral cancer, gum disease, and other conditions linked to overall health, including heart disease and diabetes.  Please see the attached documents for additional preventive care recommendations.

## 2024-09-21 ENCOUNTER — Other Ambulatory Visit: Payer: Self-pay | Admitting: Internal Medicine

## 2024-09-21 DIAGNOSIS — E785 Hyperlipidemia, unspecified: Secondary | ICD-10-CM

## 2024-10-08 ENCOUNTER — Other Ambulatory Visit: Payer: Self-pay | Admitting: Internal Medicine

## 2024-10-08 DIAGNOSIS — R053 Chronic cough: Secondary | ICD-10-CM

## 2024-11-04 ENCOUNTER — Ambulatory Visit: Attending: Internal Medicine | Admitting: Internal Medicine

## 2024-11-04 ENCOUNTER — Encounter: Payer: Self-pay | Admitting: Internal Medicine

## 2024-11-04 VITALS — BP 152/70 | HR 71 | Temp 97.7°F | Ht 69.0 in | Wt 130.0 lb

## 2024-11-04 DIAGNOSIS — G309 Alzheimer's disease, unspecified: Secondary | ICD-10-CM | POA: Diagnosis not present

## 2024-11-04 DIAGNOSIS — I10 Essential (primary) hypertension: Secondary | ICD-10-CM | POA: Diagnosis not present

## 2024-11-04 DIAGNOSIS — E785 Hyperlipidemia, unspecified: Secondary | ICD-10-CM | POA: Diagnosis not present

## 2024-11-04 DIAGNOSIS — N1832 Chronic kidney disease, stage 3b: Secondary | ICD-10-CM | POA: Diagnosis not present

## 2024-11-04 DIAGNOSIS — F0280 Dementia in other diseases classified elsewhere without behavioral disturbance: Secondary | ICD-10-CM

## 2024-11-04 DIAGNOSIS — D638 Anemia in other chronic diseases classified elsewhere: Secondary | ICD-10-CM

## 2024-11-04 DIAGNOSIS — D631 Anemia in chronic kidney disease: Secondary | ICD-10-CM

## 2024-11-04 MED ORDER — BLOOD PRESSURE MONITOR DEVI: 0 refills | Status: AC

## 2024-11-04 NOTE — Patient Instructions (Signed)
" °  VISIT SUMMARY: During your visit, we discussed the management of your chronic conditions, including hypertension, chronic kidney disease, hypercholesterolemia, chronic anemia, dementia, and chronic hoarseness. Your wife accompanied you, and we reviewed your current medications and recent health changes.  YOUR PLAN: -ESSENTIAL HYPERTENSION: Hypertension means high blood pressure. We will continue your current medications, metoprolol  and amlodipine . I have sent a prescription for a blood pressure monitor to your pharmacy. Please check if your insurance covers it. Bring all your medication bottles to your next appointment for review. Your kidney specialist also mentioned that a medication called Losartan 25 mg daily was prescribed for blood pressure and kidneys. Please check to see if you have this at home.   -STAGE 3B CHRONIC KIDNEY DISEASE: Chronic kidney disease means your kidneys are not working as well as they should. Your kidney function is stable. Please check if you are taking losartan 25 mg daily and bring all your medication bottles to your next appointment for review. Your kidney specialist recommended stopping Pantoprazole  and using Pepsid instead from over the counter for acid reflux symptoms.   -HYPERLIPIDEMIA: Hyperlipidemia means you have high cholesterol levels. Your cholesterol is slightly elevated. Continue taking pravastatin  20 mg daily.  -CHRONIC ANEMIA: Anemia means you have a lower than normal number of red blood cells. Continue taking ferrous sulfate  twice a week.  -DEMENTIA DUE TO ALZHEIMER'S DISEASE: Dementia is a condition that affects memory and thinking skills. Your memory is stable, and there are no safety concerns. Continue with your current management.  -CHRONIC HOARSENESS: Chronic hoarseness means you have a persistent change in your voice. Continue using the allergy nasal spray and Claritin as needed. No referral to an ENT specialist is needed at this  time.  INSTRUCTIONS: Please bring all your medication bottles to your next appointment for review. Check if your blood pressure monitor is covered by insurance. Confirm if you are taking losartan 25 mg daily.    Contains text generated by [employer].   "

## 2024-11-04 NOTE — Progress Notes (Signed)
 "   Patient ID: Brandon Taylor, male    DOB: May 30, 1941  MRN: [id number]  CC: Hypertension (HTN f/u./No questions / concerns/Already received flu vax)   Subjective: Brandon Taylor is a 84 y.o. male who presents for chronic ds management. Wife is with him. His chronic medical issues include:  history of A.Fib resolve off anticoag, GERD, Hx of ETOH abuse, subacute thyroiditis resolved and off medication, dementia, vitamin B12 deficiency, HL, HTN, CKD 3, ACD, UACS with elev  IgE (Dr. Darlean - recommended referral to allergist but pt declined).   Discussed the use of AI scribe software for clinical note transcription with the patient, who gave verbal consent to proceed.  History of Present Illness Brandon Taylor is an 84 year old male with dementia, hypertension, chronic kidney disease, and hypercholesterolemia who presents for follow-up of his chronic medical conditions. He is accompanied by his wife.  He is currently taking metoprolol  25 mg twice daily and amlodipine  2.5 mg daily for blood pressure management.  He had seen his nephrologist in October.  From review of his note, it appears that patient should also be on Cozaar 25 mg daily.  Patient has protein urea  and nephrologist commented that this has been decreasing on the ARB.  Also noted to have secondary hyperparathyroidism.  GFR was 37 which is stable for him.  He has chronic anemia with hemoglobin on that visit being 11.8 which is also stable.  He is taking ferrous sulfate  twice a week. He recommended that pt d/c Pantoprazole  and change to Pepcid instead. -He has taken his morning dose of metoprolol  but is unsure about the amlodipine  and whether he has Cozaar. He does not have a device to check his blood pressure at home. No chest pain or shortness of breath, but he has persistent leg swelling.  Chronic hoarseness persists despite using allergy nasal spray and over-the-counter Claritin as needed. He previously saw an ENT specialist who suggested  vocal cord injections, which he declined and still does not seem interested today.  His cholesterol was slightly elevated at 102 mg/dL during the last visit, and he continues to take pravastatin  20 mg daily without experiencing muscle aches.  His weight has increased from 124 pounds in September to 130 pounds today, which he attributes to increased eating, especially over the holidays. He saw a endocrinologist Dr. Kellie for f/u hyperthyroidism, and his thyroid  levels were normal without the need for medication.  Dementia: His memory is stable, and his wife confirms there are no safety concerns at home related to his memory.     Patient Active Problem List   Diagnosis Date Noted   Coronary artery calcification of native artery 03/05/2024   Upper airway cough syndrome 11/06/2023   History of hyperthyroidism 07/31/2023   Moderate protein-calorie malnutrition 11/28/2022   PAF (paroxysmal atrial fibrillation) (HCC) 07/28/2022   Hyperthyroidism 07/28/2022   Atrial fibrillation with RVR (HCC) 06/28/2022   Pneumonia due to COVID-19 virus 06/28/2022   Stage 3b chronic kidney disease (HCC) 12/04/2020   Anemia, chronic disease 09/28/2019   Mixed hyperlipidemia 07/20/2017   Mild dementia (HCC) 05/29/2017   Surgical site reaction 12/06/2015   Routine general medical examination at a health care facility 10/24/2014   Vitamin B12 deficiency 10/03/2013   Loss of weight 10/03/2013   Alcohol abuse 10/03/2013   Nicotine abuse 10/03/2013   Hypoglycemia 09/22/2013   General weakness 09/22/2013   GERD (gastroesophageal reflux disease)    ETOH abuse      Medications  Ordered Prior to Encounter[1]  Allergies[2]  Social History   Socioeconomic History   Marital status: Married    Spouse name: Not on file   Number of children: Not on file   Years of education: Not on file   Highest education level: Not on file  Occupational History   Not on file  Tobacco Use   Smoking status: Former     Current packs/day: 0.00    Average packs/day: 2.0 packs/day for 40.0 years (80.0 ttl pk-yrs)    Types: Cigarettes    Start date: 10/13/1972    Quit date: 10/13/2012    Years since quitting: 12.0   Smokeless tobacco: Never  Substance and Sexual Activity   Alcohol use: Not Currently    Comment: fifth daily   Drug use: No   Sexual activity: Not on file  Other Topics Concern   Not on file  Social History Narrative   Pt lives in 1 story home with his wife   Has 2 adult daughters - live here in [place]. Attends church weekly.    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[employer] (?)   Social Drivers of Health   Tobacco Use: Medium Risk (11/04/2024)   Patient History    Smoking Tobacco Use: Former    Smokeless Tobacco Use: Never    Passive Exposure: Not on file  Financial Resource Strain: Low Risk (08/02/2024)   Overall Financial Resource Strain (CARDIA)    Difficulty of Paying Living Expenses: Not very hard  Food Insecurity: No Food Insecurity (08/02/2024)   Epic    Worried About Programme Researcher, Broadcasting/film/video in the Last Year: Never true    Ran Out of Food in the Last Year: Never true  Transportation Needs: No Transportation Needs (08/02/2024)   Epic    Lack of Transportation (Medical): No    Lack of Transportation (Non-Medical): No  Physical Activity: Sufficiently Active (08/02/2024)   Exercise Vital Sign    Days of Exercise per Week: 5 days    Minutes of Exercise per Session: 30 min  Stress: No Stress Concern Present (08/02/2024)   [employer] of Occupational Health - Occupational Stress Questionnaire    Feeling of Stress: Not at all  Social Connections: Moderately Integrated (08/02/2024)   Social Connection and Isolation Panel    Frequency of Communication with Friends and Family: Twice a week    Frequency of Social Gatherings with Friends and Family: More than three times a week    Attends Religious Services: Never    Doctor, General Practice or Organizations: Yes    Attends Banker Meetings: Never    Marital Status: Married  Catering Manager Violence: Not At Risk (08/02/2024)   Epic    Fear of Current or Ex-Partner: No    Emotionally Abused: No    Physically Abused: No    Sexually Abused: No  Depression (PHQ2-9): Low Risk (08/02/2024)   Depression (PHQ2-9)    PHQ-2 Score: 0  Alcohol Screen: Low Risk (08/02/2024)   Alcohol Screen    Last Alcohol Screening Score (AUDIT): 0  Housing: Low Risk (08/02/2024)   Epic    Unable to Pay for Housing in the Last Year: No    Number of Times Moved in the Last Year: 0    Homeless in the Last Year: No  Utilities: Not At Risk (08/02/2024)   Epic    Threatened with loss of utilities: No  Health Literacy:  Inadequate Health Literacy (08/02/2024)   B1300 Health Literacy    Frequency of need for help with medical instructions: Sometimes    Family History  Problem Relation Age of Onset   COPD Brother     Past Surgical History:  Procedure Laterality Date   NO PAST SURGERIES      ROS: Review of Systems Negative except as stated above  PHYSICAL EXAM: BP (!) 152/70   Pulse 71   Temp 97.7 F (36.5 C) (Oral)   Ht 5' 9 (1.753 m)   Wt 130 lb (59 kg)   SpO2 100%   BMI 19.20 kg/m   Wt Readings from Last 3 Encounters:  11/04/24 130 lb (59 kg)  08/02/24 128 lb (58.1 kg)  08/01/24 128 lb (58.1 kg)    Physical Exam   General appearance - elderly AAM in NAD who still appears underwgh for height. Pt with mild to moderate hoarsiness unchanged from previous.  Mental status -alert.  Answers most questions appropriately but is a bit forgetful. Mouth - mucous membranes moist, pharynx normal without lesions Neck - supple, no significant adenopathy Chest - clear to auscultation, no wheezes, rales or rhonchi, symmetric air entry Heart - normal rate, regular rhythm, normal S1, S2, no murmurs, rubs, clicks or gallops Extremities -trace ankle edema left greater than  right.  Dorsalis pedis and posterior tibialis pulses are good.  Feet warm.     Latest Ref Rng & Units 07/05/2024   12:12 PM 12/07/2023    9:57 AM 04/06/2023    3:42 PM  CMP  Glucose 70 - 99 mg/dL 82  894  80   BUN 8 - 27 mg/dL 23  23  22    Creatinine 0.76 - 1.27 mg/dL 8.32  8.23  8.35   Sodium 134 - 144 mmol/L 144  142  140   Potassium 3.5 - 5.2 mmol/L 5.1  5.0  4.5   Chloride 96 - 106 mmol/L 110  106  103   CO2 20 - 29 mmol/L 20  20  23    Calcium 8.6 - 10.2 mg/dL 9.0  9.4  9.2   Total Protein 6.0 - 8.5 g/dL 6.5  6.9  7.4   Total Bilirubin 0.0 - 1.2 mg/dL 0.4  0.4  0.2   Alkaline Phos 48 - 129 IU/L 105  117  103   AST 0 - 40 IU/L 21  19  19    ALT 0 - 44 IU/L 9  9  8     Lipid Panel     Component Value Date/Time   CHOL 183 07/05/2024 1212   TRIG 81 07/05/2024 1212   HDL 66 07/05/2024 1212   CHOLHDL 2.8 07/05/2024 1212   CHOLHDL 2.5 01/02/2014 0947   VLDL 14 01/02/2014 0947   LDLCALC 102 (H) 07/05/2024 1212    CBC    Component Value Date/Time   WBC 5.4 07/05/2024 1212   WBC 5.6 11/06/2023 1008   RBC 4.26 07/05/2024 1212   RBC 4.22 11/06/2023 1008   HGB 12.4 (L) 07/05/2024 1212   HCT 38.3 07/05/2024 1212   PLT 191 07/05/2024 1212   MCV 90 07/05/2024 1212   MCH 29.1 07/05/2024 1212   MCH 29.2 06/30/2022 0926   MCHC 32.4 07/05/2024 1212   MCHC 32.5 11/06/2023 1008   RDW 13.9 07/05/2024 1212   LYMPHSABS 1.1 11/06/2023 1008   LYMPHSABS 1.2 05/02/2021 1140   MONOABS 0.6 11/06/2023 1008   EOSABS 0.0 11/06/2023 1008   EOSABS 0.0 05/02/2021 1140  BASOSABS 0.0 11/06/2023 1008   BASOSABS 0.0 05/02/2021 1140    ASSESSMENT AND PLAN: 1. Essential Hypertension Not at goal.  He has not taken all of his medicines as yet for the morning and is unsure whether he has the Cozaar 25 mg home.  I wrote this name down so that his wife can check his bottles when she returns home.  If he does not have that she will let me know.  Otherwise he will continue metoprolol  25 mg twice a day,  amlodipine  2.5 mg daily and Cozaar 25 mg daily. No home monitoring device available.  Sent prescription for blood pressure monitor to pharmacy. - Advised to bring medication bottles to next appointment for reconciliation. - Blood Pressure Monitor DEVI; Use as directed to check home blood pressure 2-3 times a week  Dispense: 1 each; Refill: 0  2. Dementia due to Alzheimer's disease (HCC) Stable per patient's wife.  No safety issues at home so far and he remains independent in ADLs.  3. Stage 3b chronic kidney disease (HCC) With protein urea  and decreasing per nephrology's note. Stable. D/C Pantoprazole . Advise wive to change to Famotidine instead.  4. Anemia in Stage 3 Kidney Ds Stable.  Continue iron supplement twice a week.  5. Hyperlipidemia, unspecified hyperlipidemia type Continue pravastatin  20 mg daily.  Patient was given the opportunity to ask questions.  Patient verbalized understanding of the plan and was able to repeat key elements of the plan.   This documentation was completed using Paediatric nurse.  Any transcriptional errors are unintentional.  No orders of the defined types were placed in this encounter.    Requested Prescriptions   Signed Prescriptions Disp Refills   Blood Pressure Monitor DEVI 1 each 0    Sig: Use as directed to check home blood pressure 2-3 times a week    Return in about 4 months (around 03/04/2025).  Barnie Louder, MD, FACP     [1]  Current Outpatient Medications on File Prior to Visit  Medication Sig Dispense Refill   albuterol  (VENTOLIN  HFA) 108 (90 Base) MCG/ACT inhaler INHALE 2 PUFFS INTO THE LUNGS EVERY 6 (SIX) HOURS AS NEEDED FOR WHEEZING (AS NEEDED FOR WHEEZING/COUGH). 8.5 each 0   amLODipine  (NORVASC ) 2.5 MG tablet Take 1 tablet (2.5 mg total) by mouth daily. 90 tablet 1   D-1000 EXTRA STRENGTH 25 MCG (1000 UT) tablet TAKE 1 TABLET BY MOUTH EVERY DAY 90 tablet 1   ferrous sulfate  325 (65 FE) MG tablet TAKE  1 TABLET BY MOUTH 2 TIMES A WEEK 24 tablet 1   fluticasone  (FLONASE ) 50 MCG/ACT nasal spray SPRAY 1 SPRAY INTO BOTH NOSTRILS DAILY. 48 mL 0   latanoprost (XALATAN) 0.005 % ophthalmic solution Place 1 drop into both eyes at bedtime as needed.     losartan (COZAAR) 25 MG tablet Take 25 mg by mouth at bedtime.     metoprolol  tartrate (LOPRESSOR ) 25 MG tablet Take 1 tablet (25 mg total) by mouth 2 (two) times daily. 180 tablet 1   pravastatin  (PRAVACHOL ) 20 MG tablet TAKE 1 TABLET BY MOUTH EVERY DAY 90 tablet 2   [facility] VITAMIN B12 1000 MCG tablet TAKE 1 TABLET BY MOUTH EVERY DAY (Patient not taking: Reported on 11/04/2024) 100 tablet 0   No current facility-administered medications on file prior to visit.  [2] No Known Allergies  "

## 2024-11-05 ENCOUNTER — Encounter: Payer: Self-pay | Admitting: Internal Medicine

## 2024-11-08 ENCOUNTER — Telehealth: Payer: Self-pay | Admitting: Internal Medicine

## 2024-11-08 NOTE — Telephone Encounter (Signed)
 Copied from CRM [id number]. Topic: Clinical - Medication Question >> Nov 08, 2024 10:27 AM   Antwanette L wrote:  Reason for CRM: Uneda, the pt wife called to provide Dr. Vicci w/ the name of the medication prescribed by the pt kidney provider. The medication is called Losartan. She reports that the pt was able to pick up the prescription.

## 2025-03-07 ENCOUNTER — Ambulatory Visit: Payer: Self-pay | Admitting: Internal Medicine
